# Patient Record
Sex: Female | Born: 1935 | Race: White | Hispanic: No | Smoking: Never smoker
Health system: Southern US, Community
[De-identification: ages and names within clinical notes are randomized; demographics above are authoritative.]

## PROBLEM LIST (undated history)

## (undated) DIAGNOSIS — I509 Heart failure, unspecified: Secondary | ICD-10-CM

## (undated) DIAGNOSIS — E785 Hyperlipidemia, unspecified: Secondary | ICD-10-CM

## (undated) DIAGNOSIS — E039 Hypothyroidism, unspecified: Secondary | ICD-10-CM

## (undated) DIAGNOSIS — F419 Anxiety disorder, unspecified: Secondary | ICD-10-CM

## (undated) DIAGNOSIS — F0390 Unspecified dementia without behavioral disturbance: Secondary | ICD-10-CM

## (undated) DIAGNOSIS — K219 Gastro-esophageal reflux disease without esophagitis: Secondary | ICD-10-CM

## (undated) DIAGNOSIS — N289 Disorder of kidney and ureter, unspecified: Secondary | ICD-10-CM

## (undated) DIAGNOSIS — M810 Age-related osteoporosis without current pathological fracture: Secondary | ICD-10-CM

## (undated) DIAGNOSIS — I10 Essential (primary) hypertension: Secondary | ICD-10-CM

## (undated) HISTORY — PX: BLADDER SURGERY: SHX569

---

## 2014-04-27 ENCOUNTER — Encounter (HOSPITAL_COMMUNITY): Payer: Self-pay | Admitting: *Deleted

## 2014-04-27 ENCOUNTER — Emergency Department (HOSPITAL_COMMUNITY): Payer: Medicare HMO

## 2014-04-27 ENCOUNTER — Emergency Department (HOSPITAL_COMMUNITY)
Admission: EM | Admit: 2014-04-27 | Discharge: 2014-04-27 | Disposition: A | Discharge status: Home / Self Care | Payer: Medicare HMO | Attending: Emergency Medicine | Admitting: Emergency Medicine

## 2014-04-27 ENCOUNTER — Emergency Department (HOSPITAL_COMMUNITY)
Admission: EM | Admit: 2014-04-27 | Discharge: 2014-04-28 | Disposition: A | Discharge status: Home / Self Care | Payer: Medicare HMO | Attending: Emergency Medicine | Admitting: Emergency Medicine

## 2014-04-27 ENCOUNTER — Encounter (HOSPITAL_COMMUNITY): Payer: Self-pay | Admitting: Emergency Medicine

## 2014-04-27 DIAGNOSIS — I10 Essential (primary) hypertension: Secondary | ICD-10-CM | POA: Insufficient documentation

## 2014-04-27 DIAGNOSIS — Z880 Allergy status to penicillin: Secondary | ICD-10-CM | POA: Insufficient documentation

## 2014-04-27 DIAGNOSIS — Z7982 Long term (current) use of aspirin: Secondary | ICD-10-CM | POA: Insufficient documentation

## 2014-04-27 DIAGNOSIS — E039 Hypothyroidism, unspecified: Secondary | ICD-10-CM | POA: Insufficient documentation

## 2014-04-27 DIAGNOSIS — Z8639 Personal history of other endocrine, nutritional and metabolic disease: Secondary | ICD-10-CM | POA: Insufficient documentation

## 2014-04-27 DIAGNOSIS — R197 Diarrhea, unspecified: Secondary | ICD-10-CM | POA: Diagnosis not present

## 2014-04-27 DIAGNOSIS — Z8739 Personal history of other diseases of the musculoskeletal system and connective tissue: Secondary | ICD-10-CM | POA: Diagnosis not present

## 2014-04-27 DIAGNOSIS — R1012 Left upper quadrant pain: Secondary | ICD-10-CM | POA: Insufficient documentation

## 2014-04-27 DIAGNOSIS — Z79899 Other long term (current) drug therapy: Secondary | ICD-10-CM | POA: Insufficient documentation

## 2014-04-27 DIAGNOSIS — R110 Nausea: Secondary | ICD-10-CM | POA: Diagnosis not present

## 2014-04-27 DIAGNOSIS — K551 Chronic vascular disorders of intestine: Secondary | ICD-10-CM | POA: Insufficient documentation

## 2014-04-27 HISTORY — DX: Hypothyroidism, unspecified: E03.9

## 2014-04-27 HISTORY — DX: Hyperlipidemia, unspecified: E78.5

## 2014-04-27 HISTORY — DX: Essential (primary) hypertension: I10

## 2014-04-27 HISTORY — DX: Age-related osteoporosis without current pathological fracture: M81.0

## 2014-04-27 LAB — COMPREHENSIVE METABOLIC PANEL
ALK PHOS: 55 U/L (ref 39–117)
ALT: 15 U/L (ref 0–35)
ALT: 13 U/L (ref 0–35)
AST: 36 U/L (ref 0–37)
AST: 35 U/L (ref 0–37)
Albumin: 4.1 g/dL (ref 3.5–5.2)
Albumin: 4.3 g/dL (ref 3.5–5.2)
Alkaline Phosphatase: 54 U/L (ref 39–117)
Anion gap: 10 (ref 5–15)
Anion gap: 8 (ref 5–15)
BILIRUBIN TOTAL: 0.8 mg/dL (ref 0.3–1.2)
BUN: 14 mg/dL (ref 6–23)
BUN: 10 mg/dL (ref 6–23)
CALCIUM: 8.5 mg/dL (ref 8.4–10.5)
CHLORIDE: 96 mmol/L (ref 96–112)
CO2: 24 mmol/L (ref 19–32)
CO2: 27 mmol/L (ref 19–32)
Calcium: 8.4 mg/dL (ref 8.4–10.5)
Chloride: 98 mmol/L (ref 96–112)
Creatinine, Ser: 0.53 mg/dL (ref 0.50–1.10)
Creatinine, Ser: 0.52 mg/dL (ref 0.50–1.10)
GFR calc Af Amer: >90 mL/min (ref >90)
GFR calc non Af Amer: 80 mL/min — ABNORMAL LOW (ref >90)
GFR calc non Af Amer: 80 mL/min — ABNORMAL LOW (ref >90)
Glucose, Bld: 141 mg/dL — ABNORMAL HIGH (ref 70–99)
Glucose, Bld: 144 mg/dL — ABNORMAL HIGH (ref 70–99)
POTASSIUM: 3.6 mmol/L (ref 3.5–5.1)
Potassium: 3.5 mmol/L (ref 3.5–5.1)
Sodium: 132 mmol/L — ABNORMAL LOW (ref 135–145)
Sodium: 131 mmol/L — ABNORMAL LOW (ref 135–145)
TOTAL PROTEIN: 6.6 g/dL (ref 6.0–8.3)
Total Bilirubin: 0.8 mg/dL (ref 0.3–1.2)
Total Protein: 6.8 g/dL (ref 6.0–8.3)

## 2014-04-27 LAB — CBC WITH DIFFERENTIAL/PLATELET
BASOS ABS: 0 10*3/uL (ref 0.0–0.1)
BASOS ABS: 0 10*3/uL (ref 0.0–0.1)
BASOS PCT: 0 % (ref 0–1)
Basophils Relative: 0 % (ref 0–1)
EOS ABS: 0 10*3/uL (ref 0.0–0.7)
Eosinophils Absolute: 0 10*3/uL (ref 0.0–0.7)
Eosinophils Relative: 0 % (ref 0–5)
Eosinophils Relative: 0 % (ref 0–5)
HCT: 36.3 % (ref 36.0–46.0)
HEMATOCRIT: 35.2 % — AB (ref 36.0–46.0)
Hemoglobin: 12 g/dL (ref 12.0–15.0)
Hemoglobin: 12.2 g/dL (ref 12.0–15.0)
LYMPHS ABS: 0.6 10*3/uL — AB (ref 0.7–4.0)
LYMPHS PCT: 9 % — AB (ref 12–46)
LYMPHS PCT: 12 % (ref 12–46)
Lymphs Abs: 0.5 10*3/uL — ABNORMAL LOW (ref 0.7–4.0)
MCH: 31.6 pg (ref 26.0–34.0)
MCH: 31.4 pg (ref 26.0–34.0)
MCHC: 34.1 g/dL (ref 30.0–36.0)
MCHC: 33.6 g/dL (ref 30.0–36.0)
MCV: 92.6 fL (ref 78.0–100.0)
MCV: 93.6 fL (ref 78.0–100.0)
MONO ABS: 0.2 10*3/uL (ref 0.1–1.0)
Monocytes Absolute: 0.4 10*3/uL (ref 0.1–1.0)
Monocytes Relative: 4 % (ref 3–12)
Monocytes Relative: 9 % (ref 3–12)
NEUTROS ABS: 3.7 10*3/uL (ref 1.7–7.7)
NEUTROS PCT: 79 % — AB (ref 43–77)
Neutro Abs: 4.4 10*3/uL (ref 1.7–7.7)
Neutrophils Relative %: 87 % — ABNORMAL HIGH (ref 43–77)
PLATELETS: 182 10*3/uL (ref 150–400)
PLATELETS: 206 10*3/uL (ref 150–400)
RBC: 3.8 MIL/uL — ABNORMAL LOW (ref 3.87–5.11)
RBC: 3.88 MIL/uL (ref 3.87–5.11)
RDW: 13.2 % (ref 11.5–15.5)
RDW: 13.2 % (ref 11.5–15.5)
WBC: 5.1 10*3/uL (ref 4.0–10.5)
WBC: 4.6 10*3/uL (ref 4.0–10.5)

## 2014-04-27 LAB — URINALYSIS, ROUTINE W REFLEX MICROSCOPIC
BILIRUBIN URINE: NEGATIVE
Bilirubin Urine: NEGATIVE
Glucose, UA: NEGATIVE mg/dL
Glucose, UA: NEGATIVE mg/dL
HGB URINE DIPSTICK: NEGATIVE
Hgb urine dipstick: NEGATIVE
Ketones, ur: NEGATIVE mg/dL
Ketones, ur: 40 mg/dL — AB
LEUKOCYTES UA: NEGATIVE
Leukocytes, UA: NEGATIVE
Nitrite: NEGATIVE
Nitrite: NEGATIVE
PH: 7.5 (ref 5.0–8.0)
Protein, ur: NEGATIVE mg/dL
Protein, ur: NEGATIVE mg/dL
SPECIFIC GRAVITY, URINE: 1.007 (ref 1.005–1.030)
Specific Gravity, Urine: 1.011 (ref 1.005–1.030)
UROBILINOGEN UA: 0.2 mg/dL (ref 0.0–1.0)
Urobilinogen, UA: 0.2 mg/dL (ref 0.0–1.0)
pH: 7 (ref 5.0–8.0)

## 2014-04-27 LAB — I-STAT CG4 LACTIC ACID, ED: Lactic Acid, Venous: 1.13 mmol/L (ref 0.5–2.0)

## 2014-04-27 LAB — LIPASE, BLOOD: LIPASE: 26 U/L (ref 11–59)

## 2014-04-27 MED ORDER — ONDANSETRON HCL 4 MG/2ML IJ SOLN
4.0000 mg | Freq: Once | INTRAMUSCULAR | Status: AC
  Administered 2014-04-27: 4 mg via INTRAVENOUS
  Filled 2014-04-27: qty 2

## 2014-04-27 MED ORDER — SODIUM CHLORIDE 0.9 % IV BOLUS (SEPSIS)
500.0000 mL | Freq: Once | INTRAVENOUS | Status: AC
  Administered 2014-04-27: 500 mL via INTRAVENOUS

## 2014-04-27 MED ORDER — SODIUM CHLORIDE 0.9 % IV SOLN
INTRAVENOUS | Status: DC
  Administered 2014-04-27: 08:00:00 via INTRAVENOUS

## 2014-04-27 MED ORDER — SODIUM CHLORIDE 0.9 % IV BOLUS (SEPSIS)
1000.0000 mL | Freq: Once | INTRAVENOUS | Status: AC
  Administered 2014-04-27: 1000 mL via INTRAVENOUS

## 2014-04-27 MED ORDER — LOPERAMIDE HCL 2 MG PO CAPS
2.0000 mg | ORAL_CAPSULE | Freq: Once | ORAL | Status: AC
  Administered 2014-04-27: 2 mg via ORAL
  Filled 2014-04-27: qty 1

## 2014-04-27 MED ORDER — ONDANSETRON 4 MG PO TBDP: 4.0000 mg | ORAL_TABLET | Freq: Three times a day (TID) | ORAL | Status: DC | PRN

## 2014-04-27 MED ORDER — LOPERAMIDE HCL 2 MG PO CAPS: 2.0000 mg | ORAL_CAPSULE | Freq: Three times a day (TID) | ORAL | Status: DC | PRN

## 2014-04-27 MED ORDER — SODIUM CHLORIDE 0.9 % IV SOLN
1000.0000 mL | Freq: Once | INTRAVENOUS | Status: AC
  Administered 2014-04-27: 1000 mL via INTRAVENOUS

## 2014-04-27 NOTE — ED Notes (Signed)
Pt's daughter reports nausea is not getting any better.  Was seen here earlier for same and was instructed to return if worse.  Daughter reports pt is increasingly weak with severe nausea.  Pt was able to eat a toast and drank some water and OJ.

## 2014-04-27 NOTE — Discharge Instructions (Signed)
Nausea, Adult Nausea is the feeling that you have an upset stomach or have to vomit. Nausea by itself is not likely a serious concern, but it may be an early sign of more serious medical problems. As nausea gets worse, it can lead to vomiting. If vomiting develops, there is the risk of dehydration.  CAUSES   Viral infections.  Food poisoning.  Medicines.  Pregnancy.  Motion sickness.  Migraine headaches.  Emotional distress.  Severe pain from any source.  Alcohol intoxication. HOME CARE INSTRUCTIONS  Get plenty of rest.  Ask your caregiver about specific rehydration instructions.  Eat small amounts of food and sip liquids more often.  Take all medicines as told by your caregiver. SEEK MEDICAL CARE IF:  You have not improved after 2 days, or you get worse.  You have a headache. SEEK IMMEDIATE MEDICAL CARE IF:   You have a fever.  You faint.  You keep vomiting or have blood in your vomit.  You are extremely weak or dehydrated.  You have dark or bloody stools.  You have severe chest or abdominal pain. MAKE SURE YOU:  Understand these instructions.  Will watch your condition.  Will get help right away if you are not doing well or get worse. Document Released: 03/23/2004 Document Revised: 11/08/2011 Document Reviewed: 10/26/2010 [facility] Patient Information 2015 [place]. This information is not intended to replace advice given to you by your health care provider. Make sure you discuss any questions you have with your health care provider.   Diarrhea Diarrhea is frequent loose and watery bowel movements. It can cause you to feel weak and dehydrated. Dehydration can cause you to become tired and thirsty, have a dry mouth, and have decreased urination that often is dark yellow. Diarrhea is a sign of another problem, most often an infection that will not last long. In most cases, diarrhea typically lasts 2-3 days. However, it can last longer if it is a sign  of something more serious. It is important to treat your diarrhea as directed by your caregiver to lessen or prevent future episodes of diarrhea. CAUSES  Some common causes include:  Gastrointestinal infections caused by viruses, bacteria, or parasites.  Food poisoning or food allergies.  Certain medicines, such as antibiotics, chemotherapy, and laxatives.  Artificial sweeteners and fructose.  Digestive disorders. HOME CARE INSTRUCTIONS  Ensure adequate fluid intake (hydration): Have 1 cup (8 oz) of fluid for each diarrhea episode. Avoid fluids that contain simple sugars or sports drinks, fruit juices, whole milk products, and sodas. Your urine should be clear or pale yellow if you are drinking enough fluids. Hydrate with an oral rehydration solution that you can purchase at pharmacies, retail stores, and online. You can prepare an oral rehydration solution at home by mixing the following ingredients together:   - tsp table salt.   tsp baking soda.   tsp salt substitute containing potassium chloride.  1  tablespoons sugar.  1 L (34 oz) of water.  Certain foods and beverages may increase the speed at which food moves through the gastrointestinal (GI) tract. These foods and beverages should be avoided and include:  Caffeinated and alcoholic beverages.  High-fiber foods, such as raw fruits and vegetables, nuts, seeds, and whole grain breads and cereals.  Foods and beverages sweetened with sugar alcohols, such as xylitol, sorbitol, and mannitol.  Some foods may be well tolerated and may help thicken stool including:  Starchy foods, such as rice, toast, pasta, low-sugar cereal, oatmeal, grits, baked  potatoes, crackers, and bagels.  Bananas.  Applesauce.  Add probiotic-rich foods to help increase healthy bacteria in the GI tract, such as yogurt and fermented milk products.  Wash your hands well after each diarrhea episode.  Only take over-the-counter or prescription  medicines as directed by your caregiver.  Take a warm bath to relieve any burning or pain from frequent diarrhea episodes. SEEK IMMEDIATE MEDICAL CARE IF:   You are unable to keep fluids down.  You have persistent vomiting.  You have blood in your stool, or your stools are black and tarry.  You do not urinate in 6-8 hours, or there is only a small amount of very dark urine.  You have abdominal pain that increases or localizes.  You have weakness, dizziness, confusion, or light-headedness.  You have a severe headache.  Your diarrhea gets worse or does not get better.  You have a fever or persistent symptoms for more than 2-3 days.  You have a fever and your symptoms suddenly get worse. MAKE SURE YOU:   Understand these instructions.  Will watch your condition.  Will get help right away if you are not doing well or get worse. Document Released: 02/03/2002 Document Revised: 06/30/2013 Document Reviewed: 10/22/2011 [facility] Patient Information 2015 [place], [place]. This information is not intended to replace advice given to you by your health care provider. Make sure you discuss any questions you have with your health care provider.    Food Choices to Help Relieve Diarrhea When you have diarrhea, the foods you eat and your eating habits are very important. Choosing the right foods and drinks can help relieve diarrhea. Also, because diarrhea can last up to 7 days, you need to replace lost fluids and electrolytes (such as sodium, potassium, and chloride) in order to help prevent dehydration.  WHAT GENERAL GUIDELINES DO I NEED TO FOLLOW?  Slowly drink 1 cup (8 oz) of fluid for each episode of diarrhea. If you are getting enough fluid, your urine will be clear or pale yellow.  Eat starchy foods. Some good choices include white rice, white toast, pasta, low-fiber cereal, baked potatoes (without the skin), saltine crackers, and bagels.  Avoid large servings of any cooked  vegetables.  Limit fruit to two servings per day. A serving is  cup or 1 small piece.  Choose foods with less than 2 g of fiber per serving.  Limit fats to less than 8 tsp (38 g) per day.  Avoid fried foods.  Eat foods that have probiotics in them. Probiotics can be found in certain dairy products.  Avoid foods and beverages that may increase the speed at which food moves through the stomach and intestines (gastrointestinal tract). Things to avoid include:  High-fiber foods, such as dried fruit, raw fruits and vegetables, nuts, seeds, and whole grain foods.  Spicy foods and high-fat foods.  Foods and beverages sweetened with high-fructose corn syrup, honey, or sugar alcohols such as xylitol, sorbitol, and mannitol. WHAT FOODS ARE RECOMMENDED? Grains White rice. White, [place], or pita breads (fresh or toasted), including plain rolls, buns, or bagels. White pasta. Saltine, soda, or graham crackers. Pretzels. Low-fiber cereal. Cooked cereals made with water (such as cornmeal, farina, or cream cereals). Plain muffins. Matzo. Melba toast. Zwieback.  Vegetables Potatoes (without the skin). Strained tomato and vegetable juices. Most well-cooked and canned vegetables without seeds. Tender lettuce. Fruits Cooked or canned applesauce, apricots, cherries, fruit cocktail, grapefruit, peaches, pears, or plums. Fresh bananas, apples without skin, cherries, grapes, cantaloupe, grapefruit, peaches,  oranges, or plums.  Meat and Other Protein Products Baked or boiled chicken. Eggs. Tofu. Fish. Seafood. Smooth peanut butter. Ground or well-cooked tender beef, ham, veal, lamb, pork, or poultry.  Dairy Plain yogurt, kefir, and unsweetened liquid yogurt. Lactose-free milk, buttermilk, or soy milk. Plain hard cheese. Beverages Sport drinks. Clear broths. Diluted fruit juices (except prune). Regular, caffeine-free sodas such as ginger ale. Water. Decaffeinated teas. Oral rehydration solutions. Sugar-free  beverages not sweetened with sugar alcohols. Other Bouillon, broth, or soups made from recommended foods.  The items listed above may not be a complete list of recommended foods or beverages. Contact your dietitian for more options. WHAT FOODS ARE NOT RECOMMENDED? Grains Whole grain, whole wheat, bran, or rye breads, rolls, pastas, crackers, and cereals. Wild or brown rice. Cereals that contain more than 2 g of fiber per serving. Corn tortillas or taco shells. Cooked or dry oatmeal. Granola. Popcorn. Vegetables Raw vegetables. Cabbage, broccoli, Brussels sprouts, artichokes, baked beans, beet greens, corn, kale, legumes, peas, sweet potatoes, and yams. Potato skins. Cooked spinach and cabbage. Fruits Dried fruit, including raisins and dates. Raw fruits. Stewed or dried prunes. Fresh apples with skin, apricots, mangoes, pears, raspberries, and strawberries.  Meat and Other Protein Products Chunky peanut butter. Nuts and seeds. Beans and lentils. Tomasa Blase.  Dairy High-fat cheeses. Milk, chocolate milk, and beverages made with milk, such as milk shakes. Cream. Ice cream. Sweets and Desserts Sweet rolls, doughnuts, and sweet breads. Pancakes and waffles. Fats and Oils Butter. Cream sauces. Margarine. Salad oils. Plain salad dressings. Olives. Avocados.  Beverages Caffeinated beverages (such as coffee, tea, soda, or energy drinks). Alcoholic beverages. Fruit juices with pulp. Prune juice. Soft drinks sweetened with high-fructose corn syrup or sugar alcohols. Other Coconut. Hot sauce. Chili powder. Mayonnaise. Gravy. Cream-based or milk-based soups.  The items listed above may not be a complete list of foods and beverages to avoid. Contact your dietitian for more information. WHAT SHOULD I DO IF I BECOME DEHYDRATED? Diarrhea can sometimes lead to dehydration. Signs of dehydration include dark urine and dry mouth and skin. If you think you are dehydrated, you should rehydrate with an oral rehydration  solution. These solutions can be purchased at pharmacies, retail stores, or online.  Drink -1 cup (120-240 mL) of oral rehydration solution each time you have an episode of diarrhea. If drinking this amount makes your diarrhea worse, try drinking smaller amounts more often. For example, drink 1-3 tsp (5-15 mL) every 5-10 minutes.  A general rule for staying hydrated is to drink 1-2 L of fluid per day. Talk to your health care provider about the specific amount you should be drinking each day. Drink enough fluids to keep your urine clear or pale yellow. Document Released: 05/06/2003 Document Revised: 02/18/2013 Document Reviewed: 01/06/2013 [facility] Patient Information 2015 [place], [place]. This information is not intended to replace advice given to you by your health care provider. Make sure you discuss any questions you have with your health care provider.

## 2014-04-27 NOTE — ED Provider Notes (Signed)
CSN: [id number]     Arrival date & time 04/27/14  2025 History   First MD Initiated Contact with Patient 04/27/14 2255     Chief Complaint  Patient presents with  . Nausea     (Consider location/radiation/quality/duration/timing/severity/associated sxs/prior Treatment) HPI Pamela Peterson is a 81 y.o. female with past medical history of hypothyroidism, hypertension, hyperlipidemia presenting today with nausea. This is a chronic complaint, daughter states that whenever she smells strong odor she has intense nausea that does not allow her to eat. She was seen in this emergency department earlier today, given IV fluids, Zofran and felt better. She was told to come back if her symptoms did not completely resolve at home. She did have a small meal today but otherwise was extremely nauseous. She reportedly had a CT scan 2 weeks ago at an outside emergency department which was negative. Patient was diaphoretic as well. She denies subjective fevers or recent infections. She also admits to left lower quadrant abdominal pain. Patient has no further complaints.  10 Systems reviewed and are negative for acute change except as noted in the HPI.    Past Medical History  Diagnosis Date  . Osteoporosis   . Hypothyroid   . Hypertension   . Hyperlipidemia    History reviewed. No pertinent past surgical history. No family history on file. History  Substance Use Topics  . Smoking status: Never Smoker   . Smokeless tobacco: Not on file  . Alcohol Use: No   OB History    No data available     Review of Systems    Allergies  Penicillins  Home Medications   Prior to Admission medications   Medication Sig Start Date End Date Taking? Authorizing Provider  amLODipine (NORVASC) 5 MG tablet Take 10 mg by mouth at bedtime.   Yes Historical Provider, MD  aspirin EC 81 MG tablet Take 81 mg by mouth daily.   Yes Historical Provider, MD  CALCIUM-VITAMIN D PO Take 1 tablet by mouth 2 (two) times daily.  Calcium 600- D3- 2000   Yes Historical Provider, MD  Cholecalciferol (VITAMIN D3) 2000 UNITS TABS Take 1 tablet by mouth daily.   Yes Historical Provider, MD  esomeprazole (NEXIUM) 40 MG capsule Take 40 mg by mouth daily at 12 noon.   Yes Historical Provider, MD  levothyroxine (SYNTHROID, LEVOTHROID) 50 MCG tablet Take 50 mcg by mouth daily before breakfast.   Yes Historical Provider, MD  losartan (COZAAR) 100 MG tablet Take 100 mg by mouth daily.   Yes Historical Provider, MD  mirabegron ER (MYRBETRIQ) 25 MG TB24 tablet Take 1 mg by mouth daily.   Yes Historical Provider, MD  pregabalin (LYRICA) 100 MG capsule Take 200 mg by mouth 2 (two) times daily.   Yes Historical Provider, MD  Chlorpheniramine-Acetaminophen (CORICIDIN HBP COLD/FLU PO) Take 1-2 tablets by mouth daily as needed (sinuese/congestion). For congestion    Historical Provider, MD  loperamide (IMODIUM) 2 MG capsule Take 1 capsule (2 mg total) by mouth every 8 (eight) hours as needed for diarrhea or loose stools. 04/27/14   Kristen N Ward, DO  ondansetron (ZOFRAN ODT) 4 MG disintegrating tablet Take 1 tablet (4 mg total) by mouth every 8 (eight) hours as needed for nausea or vomiting. 04/27/14   Kristen N Ward, DO   BP 155/81 mmHg  Pulse 80  Temp(Src) 97.9 F (36.6 C) (Oral)  Resp 17  SpO2 99% Physical Exam  Constitutional: She is oriented to person, place, and time. She  appears well-developed and well-nourished. No distress.  HENT:  Head: Normocephalic and atraumatic.  Nose: Nose normal.  Mouth/Throat: Oropharynx is clear and moist. No oropharyngeal exudate.  Eyes: Conjunctivae and EOM are normal. Pupils are equal, round, and reactive to light. No scleral icterus.  Neck: Normal range of motion. Neck supple. No JVD present. No tracheal deviation present. No thyromegaly present.  Cardiovascular: Normal rate, regular rhythm and normal heart sounds.  Exam reveals no gallop and no friction rub.   No murmur heard. Pulmonary/Chest:  Effort normal and breath sounds normal. No respiratory distress. She has no wheezes. She exhibits no tenderness.  Abdominal: Soft. Bowel sounds are normal. She exhibits no distension and no mass. There is no tenderness. There is no rebound and no guarding.  Musculoskeletal: Normal range of motion. She exhibits no edema or tenderness.  Lymphadenopathy:    She has no cervical adenopathy.  Neurological: She is alert and oriented to person, place, and time. No cranial nerve deficit. She exhibits normal muscle tone.  Skin: Skin is warm and dry. No rash noted. She is not diaphoretic. No erythema. No pallor.  Nursing note and vitals reviewed.   ED Course  Procedures (including critical care time) Labs Review Labs Reviewed  CBC WITH DIFFERENTIAL/PLATELET - Abnormal; Notable for the following:    Neutrophils Relative % 79 (*)    Lymphs Abs 0.6 (*)    All other components within normal limits  COMPREHENSIVE METABOLIC PANEL - Abnormal; Notable for the following:    Sodium 131 (*)    Glucose, Bld 144 (*)    GFR calc non Af Amer 80 (*)    All other components within normal limits  URINALYSIS, ROUTINE W REFLEX MICROSCOPIC - Abnormal; Notable for the following:    Ketones, ur 40 (*)    All other components within normal limits  LIPASE, BLOOD  I-STAT TROPOININ, ED    Imaging Review Ct Abdomen Pelvis W Contrast  04/28/2014   CLINICAL DATA:  Diarrhea and nausea.  EXAM: CT ABDOMEN AND PELVIS WITH CONTRAST  TECHNIQUE: Multidetector CT imaging of the abdomen and pelvis was performed using the standard protocol following bolus administration of intravenous contrast.  CONTRAST:  70mL OMNIPAQUE IOHEXOL 300 MG/ML  SOLN  COMPARISON:  None.  FINDINGS: BODY WALL: Unremarkable.  LOWER CHEST: Scarring or atelectasis in the lower lungs with minimal nodularity in the right middle lobe.  ABDOMEN/PELVIS:  Liver: No focal abnormality.  Biliary: No evidence of biliary obstruction or stone.  Pancreas: Unremarkable.   Spleen: Unremarkable.  Adrenals: Unremarkable.  Kidneys and ureters: No hydronephrosis or stone.  Bladder: Changes of bladder suspension.  No pathologic findings.  Reproductive: Hysterectomy.  The ovaries are unremarkable.  Bowel: No obstruction. No bowel wall thickening.  Appendectomy.  Retroperitoneum: No mass or adenopathy.  Peritoneum: No ascites or pneumoperitoneum.  Vascular: No acute abnormality. Small duplicated left IVC. Diffuse atherosclerotic calcification with prominent stenosis of the proximal SMA and celiac axis.  OSSEOUS: No acute abnormalities. Advanced multilevel degenerative disc disease, particularly severe at L1-2 with retrolisthesis and disc narrowing causes advanced foraminal stenosis.  IMPRESSION: 1. No acute intra-abdominal findings. 2. 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   Electronically Signed   By: Marnee SpringJonathon  Watts M.D.   On: 04/28/2014 02:24     EKG Interpretation   Date/Time:  Monday April 27 2014 23:35:57 EST Ventricular Rate:  79 PR Interval:  167 QRS Duration: 127 QT Interval:  458 QTC Calculation: 525 R Axis:   -  14 Text Interpretation:  Sinus rhythm Multiform ventricular premature  complexes Probable left atrial enlargement Left bundle branch block  Baseline wander in lead(s) V2 Confirmed by Erroll Luna [id number])  on 04/27/2014 11:43:34 PM      MDM   Final diagnoses:  Nausea    Patient since emergency department for isolated nausea. She is a bounce back from this morning, in addition to being a 81 year old female will obtain further diagnostics. Laboratory studies were obtained, she was given IV fluids and Zofran. Will perform CT scan of abdomen.  CT scan of the abdomen only reveals atherosclerotic narrowing of the celiac axis and SMA. Upon further questioning patient does not have any worsening symptoms with meals, suggesting mesenteric ischemia. Patient continues to be nauseous despite Zofran. Currently there  is not an indication to admit the patient, there is no dehydration on laboratory studies. She was given a dose of Reglan in the emergency department. She has a prescription from earlier today which she will get filled. Primary care follow-up was advised within 3 days. Dehydration return precautions given. Her vital signs remain within her normal limits and she is safe for discharge.    Tomasita Crumble, MD 04/28/14 [phone]

## 2014-04-27 NOTE — ED Notes (Signed)
Pt. Was given sprite and tolerated it well.

## 2014-04-27 NOTE — ED Provider Notes (Signed)
TIME SEEN: 8:15 AM  CHIEF COMPLAINT: Nausea, diarrhea  HPI: Pt is a 81 y.o. female with history of hypertension, hyperlipidemia, hypothyroidism who presents emergency department with nausea and diarrhea. Diarrhea started yesterday. She had 4 episodes of nonbloody diarrhea yesterday and has had 3 episodes today. She also has nausea but no vomiting. No fevers or chills. States she will have an occasional sharp pain in the left upper quadrant that lasts for only several seconds and then completely resolves. No other abdominal pain. No recent sick contacts, travel, hospitalization or antibiotic use. States she had similar symptoms several weeks ago and was seen at [facility] and diagnosed with a viral illness. At that time she reportedly had a negative CT scan. She is status post appendectomy.  ROS: See HPI Constitutional: no fever  Eyes: no drainage  ENT: no runny nose   Cardiovascular:  no chest pain  Resp: no SOB  GI: no vomiting GU: no dysuria Integumentary: no rash  Allergy: no hives  Musculoskeletal: no leg swelling  Neurological: no slurred speech ROS otherwise negative  PAST MEDICAL HISTORY/PAST SURGICAL HISTORY:  Past Medical History  Diagnosis Date  . Osteoporosis   . Hypothyroid   . Hypertension   . Hyperlipidemia     MEDICATIONS:  Prior to Admission medications   Not on File    ALLERGIES:  Allergies  Allergen Reactions  . Penicillins     SOCIAL HISTORY:  History  Substance Use Topics  . Smoking status: Never Smoker   . Smokeless tobacco: Not on file  . Alcohol Use: No    FAMILY HISTORY: History reviewed. No pertinent family history.  EXAM: BP 155/69 mmHg  Pulse 85  Temp(Src) 98.3 F (36.8 C) (Oral)  Resp 12  SpO2 94% CONSTITUTIONAL: Alert and oriented and responds appropriately to questions. Well-appearing; well-nourished HEAD: Normocephalic EYES: Conjunctivae clear, PERRL ENT: normal nose; no rhinorrhea; slightly dry mucous  membranes; pharynx without lesions noted NECK: Supple, no meningismus, no LAD  CARD: RRR; S1 and S2 appreciated; no murmurs, no clicks, no rubs, no gallops RESP: Normal chest excursion without splinting or tachypnea; breath sounds clear and equal bilaterally; no wheezes, no rhonchi, no rales ABD/GI: Normal bowel sounds; non-distended; soft, non-tender, no rebound, no guarding, negative Murphy sign, no peritoneal signs BACK:  The back appears normal and is non-tender to palpation, there is no CVA tenderness EXT: Normal ROM in all joints; non-tender to palpation; no edema; normal capillary refill; no cyanosis    SKIN: Normal color for age and race; warm NEURO: Moves all extremities equally PSYCH: The patient's mood and manner are appropriate. Grooming and personal hygiene are appropriate.  MEDICAL DECISION MAKING: Patient here with nausea, diarrhea. Possible viral illness. Abdominal exam is benign. She is hemodynamically stable, afebrile, nontoxic. She does appear slightly dry on exam. Will give IV fluids, Zofran, Imodium. We'll obtain labs including LFTs, lipase, lactate. We'll also obtain urinalysis. I do not feel she needs CT imaging at this time. Patient and daughter at bedside agree with plan.  ED PROGRESS: Patient's labs are unremarkable. Her lactate is normal at 1.13. Urine shows no sign of infection. She appears well hydrated now after IV fluids and is striking without difficulty. Suspect viral illness. Discussed using Imodium as needed over-the-counter cautiously for diarrhea and will discharge with prescription for Zofran. Discussed strict return precautions. Her abdominal exam is still benign. Patient and her daughter at bedside agree with this plan. Patient does have a primary care physician for follow-up.  Layla MawKristen N Braydin Aloi, DO 04/27/14 [phone]

## 2014-04-27 NOTE — ED Notes (Signed)
Daughter driving pt home. Pt made aware to follow up with PCP.

## 2014-04-27 NOTE — ED Notes (Signed)
MD at bedside. 

## 2014-04-28 ENCOUNTER — Emergency Department (HOSPITAL_COMMUNITY): Payer: Medicare HMO

## 2014-04-28 ENCOUNTER — Encounter (HOSPITAL_COMMUNITY): Payer: Self-pay

## 2014-04-28 LAB — I-STAT TROPONIN, ED: Troponin i, poc: 0.01 ng/mL (ref 0.00–0.08)

## 2014-04-28 MED ORDER — IOHEXOL 300 MG/ML  SOLN
70.0000 mL | Freq: Once | INTRAMUSCULAR | Status: AC | PRN
  Administered 2014-04-28: 70 mL via INTRAVENOUS

## 2014-04-28 MED ORDER — METOCLOPRAMIDE HCL 10 MG PO TABS
5.0000 mg | ORAL_TABLET | Freq: Once | ORAL | Status: AC
  Administered 2014-04-28: 5 mg via ORAL
  Filled 2014-04-28: qty 1

## 2014-04-28 MED ORDER — METOCLOPRAMIDE HCL 5 MG/ML IJ SOLN: 5.0000 mg | Freq: Once | INTRAMUSCULAR | Status: DC

## 2014-04-28 NOTE — Discharge Instructions (Signed)
Nausea, Adult Pamela Peterson, your CT scan did not show any cause for your nausea. Continue to take Zofran at home as needed and stay well hydrated. Follow-up with her primary care physician within 3 days. If symptoms worsen come back to emergency department and immediately. Thank you. Nausea means you feel sick to your stomach or need to throw up (vomit). It may be a sign of a more serious problem. If nausea gets worse, you may throw up. If you throw up a lot, you may lose too much body fluid (dehydration). HOME CARE   Get plenty of rest.  Ask your doctor how to replace body fluid losses (rehydrate).  Eat small amounts of food. Sip liquids more often.  Take all medicines as told by your doctor. GET HELP RIGHT AWAY IF:  You have a fever.  You pass out (faint).  You keep throwing up or have blood in your throw up.  You are very weak, have dry lips or a dry mouth, or you are very thirsty (dehydrated).  You have dark or bloody poop (stool).  You have very bad chest or belly (abdominal) pain.  You do not get better after 2 days, or you get worse.  You have a headache. MAKE SURE YOU:  Understand these instructions.  Will watch your condition.  Will get help right away if you are not doing well or get worse. Document Released: 02/02/2011 Document Revised: 05/08/2011 Document Reviewed: 02/02/2011 [employer] Patient Information 2015 [employer] [place]. This information is not intended to replace advice given to you by your health care provider. Make sure you discuss any questions you have with your health care provider.

## 2014-04-28 NOTE — ED Notes (Signed)
Pt ambulatory to bathroom

## 2014-07-21 ENCOUNTER — Emergency Department (HOSPITAL_COMMUNITY)
Admission: EM | Admit: 2014-07-21 | Discharge: 2014-07-21 | Disposition: A | Discharge status: Home / Self Care | Payer: Medicare HMO | Attending: Emergency Medicine | Admitting: Emergency Medicine

## 2014-07-21 ENCOUNTER — Encounter (HOSPITAL_COMMUNITY): Payer: Self-pay

## 2014-07-21 DIAGNOSIS — E785 Hyperlipidemia, unspecified: Secondary | ICD-10-CM | POA: Diagnosis not present

## 2014-07-21 DIAGNOSIS — Z880 Allergy status to penicillin: Secondary | ICD-10-CM | POA: Insufficient documentation

## 2014-07-21 DIAGNOSIS — E039 Hypothyroidism, unspecified: Secondary | ICD-10-CM | POA: Diagnosis not present

## 2014-07-21 DIAGNOSIS — Z79899 Other long term (current) drug therapy: Secondary | ICD-10-CM | POA: Diagnosis not present

## 2014-07-21 DIAGNOSIS — Z7982 Long term (current) use of aspirin: Secondary | ICD-10-CM | POA: Insufficient documentation

## 2014-07-21 DIAGNOSIS — M5430 Sciatica, unspecified side: Secondary | ICD-10-CM | POA: Insufficient documentation

## 2014-07-21 DIAGNOSIS — M810 Age-related osteoporosis without current pathological fracture: Secondary | ICD-10-CM | POA: Diagnosis not present

## 2014-07-21 DIAGNOSIS — I10 Essential (primary) hypertension: Secondary | ICD-10-CM | POA: Diagnosis not present

## 2014-07-21 DIAGNOSIS — R110 Nausea: Secondary | ICD-10-CM | POA: Insufficient documentation

## 2014-07-21 LAB — COMPREHENSIVE METABOLIC PANEL
ALK PHOS: 48 U/L (ref 38–126)
ALT: 12 U/L — ABNORMAL LOW (ref 14–54)
AST: 28 U/L (ref 15–41)
Albumin: 4.2 g/dL (ref 3.5–5.0)
Anion gap: 11 (ref 5–15)
BILIRUBIN TOTAL: 0.7 mg/dL (ref 0.3–1.2)
BUN: 13 mg/dL (ref 6–20)
CALCIUM: 8.7 mg/dL — AB (ref 8.9–10.3)
CO2: 25 mmol/L (ref 22–32)
CREATININE: 0.64 mg/dL (ref 0.44–1.00)
Chloride: 96 mmol/L — ABNORMAL LOW (ref 101–111)
GFR calc Af Amer: >60 mL/min (ref >60)
Glucose, Bld: 154 mg/dL — ABNORMAL HIGH (ref 65–99)
POTASSIUM: 3.4 mmol/L — AB (ref 3.5–5.1)
Sodium: 132 mmol/L — ABNORMAL LOW (ref 135–145)
Total Protein: 6.6 g/dL (ref 6.5–8.1)

## 2014-07-21 LAB — CBC WITH DIFFERENTIAL/PLATELET
Basophils Absolute: 0 10*3/uL (ref 0.0–0.1)
Basophils Relative: 0 % (ref 0–1)
Eosinophils Absolute: 0 10*3/uL (ref 0.0–0.7)
Eosinophils Relative: 1 % (ref 0–5)
HCT: 34.2 % — ABNORMAL LOW (ref 36.0–46.0)
Hemoglobin: 11.4 g/dL — ABNORMAL LOW (ref 12.0–15.0)
Lymphocytes Relative: 12 % (ref 12–46)
Lymphs Abs: 0.7 10*3/uL (ref 0.7–4.0)
MCH: 30.4 pg (ref 26.0–34.0)
MCHC: 33.3 g/dL (ref 30.0–36.0)
MCV: 91.2 fL (ref 78.0–100.0)
MONO ABS: 0.4 10*3/uL (ref 0.1–1.0)
Monocytes Relative: 7 % (ref 3–12)
Neutro Abs: 4.9 10*3/uL (ref 1.7–7.7)
Neutrophils Relative %: 80 % — ABNORMAL HIGH (ref 43–77)
Platelets: 167 10*3/uL (ref 150–400)
RBC: 3.75 MIL/uL — AB (ref 3.87–5.11)
RDW: 13 % (ref 11.5–15.5)
WBC: 6.1 10*3/uL (ref 4.0–10.5)

## 2014-07-21 LAB — LIPASE, BLOOD: Lipase: 23 U/L (ref 22–51)

## 2014-07-21 MED ORDER — SODIUM CHLORIDE 0.9 % IV BOLUS (SEPSIS)
500.0000 mL | Freq: Once | INTRAVENOUS | Status: AC
  Administered 2014-07-21: 500 mL via INTRAVENOUS

## 2014-07-21 MED ORDER — ONDANSETRON HCL 4 MG/2ML IJ SOLN
4.0000 mg | Freq: Once | INTRAMUSCULAR | Status: AC
  Administered 2014-07-21: 4 mg via INTRAVENOUS
  Filled 2014-07-21: qty 2

## 2014-07-21 MED ORDER — ONDANSETRON 4 MG PO TBDP: 4.0000 mg | ORAL_TABLET | ORAL | Status: DC | PRN

## 2014-07-21 NOTE — Discharge Instructions (Signed)

## 2014-07-21 NOTE — ED Notes (Signed)
Patient was seen at her PCP yesterday for sciatica. Patient was given Toradol. Patient has been nauseated since taking. Patient's daughter states she has to be given an anti-emetic before it goes away.

## 2014-07-21 NOTE — ED Provider Notes (Signed)
CSN: [id number]     Arrival date & time 07/21/14  1717 History   First MD Initiated Contact with Patient 07/21/14 1756     Chief Complaint  Patient presents with  . Nausea  . Diarrhea     (Consider location/radiation/quality/duration/timing/severity/associated sxs/prior Treatment) HPI The patient took a ketorolac yesterday evening that was prescribed for her for sciatica. She has been intensely nauseated ever since. She does not have abdominal pain. There is been no diarrhea or fever. This has been a common occurrence for the patient. At times certain smells can trigger an episode of intractable nausea, she is very sensitive to medications. Her daughter reports that in the past they've had to come to the emergency department several times in short sequence due to intractable nausea from some insight event. Past Medical History  Diagnosis Date  . Osteoporosis   . Hypothyroid   . Hypertension   . Hyperlipidemia    Past Surgical History  Procedure Laterality Date  . Bladder surgery     No family history on file. History  Substance Use Topics  . Smoking status: Never Smoker   . Smokeless tobacco: Not on file  . Alcohol Use: No   OB History    No data available     Review of Systems  10 Systems reviewed and are negative for acute change except as noted in the HPI.   Allergies  Penicillins  Home Medications   Prior to Admission medications   Medication Sig Start Date End Date Taking? Authorizing Provider  aspirin EC 81 MG tablet Take 81 mg by mouth daily.   Yes Historical Provider, MD  CALCIUM-VITAMIN D PO Take 1 tablet by mouth 2 (two) times daily. Calcium 600- D3- 2000   Yes Historical Provider, MD  Cholecalciferol (VITAMIN D3) 2000 UNITS TABS Take 1 tablet by mouth daily.   Yes Historical Provider, MD  esomeprazole (NEXIUM) 40 MG capsule Take 40 mg by mouth daily at 12 noon.   Yes Historical Provider, MD  levothyroxine (SYNTHROID, LEVOTHROID) 50 MCG tablet Take 50 mcg  by mouth daily before breakfast.   Yes Historical Provider, MD  loperamide (IMODIUM) 2 MG capsule Take 1 capsule (2 mg total) by mouth every 8 (eight) hours as needed for diarrhea or loose stools. 04/27/14  Yes Kristen N Ward, DO  losartan (COZAAR) 100 MG tablet Take 100 mg by mouth daily.   Yes Historical Provider, MD  mirabegron ER (MYRBETRIQ) 25 MG TB24 tablet Take 1 mg by mouth daily.   Yes Historical Provider, MD  Multiple Vitamins-Minerals (MULTIVITAMIN & MINERAL PO) Take 1 tablet by mouth daily.   Yes Historical Provider, MD  ondansetron (ZOFRAN ODT) 4 MG disintegrating tablet Take 1 tablet (4 mg total) by mouth every 8 (eight) hours as needed for nausea or vomiting. 04/27/14  Yes Kristen N Ward, DO  pregabalin (LYRICA) 100 MG capsule Take 200 mg by mouth 2 (two) times daily.   Yes Historical Provider, MD  amLODipine (NORVASC) 5 MG tablet Take 10 mg by mouth at bedtime.    Historical Provider, MD  Chlorpheniramine-Acetaminophen (CORICIDIN HBP COLD/FLU PO) Take 1-2 tablets by mouth daily as needed (sinuese/congestion). For congestion    Historical Provider, MD  ondansetron (ZOFRAN ODT) 4 MG disintegrating tablet Take 1 tablet (4 mg total) by mouth every 4 (four) hours as needed for nausea or vomiting. 07/21/14   Arby BarretteMarcy Rori Goar, MD   BP 163/59 mmHg  Pulse 73  Temp(Src) 98 F (36.7 C) (Oral)  Resp  21  Ht  (1.448 m)  Wt 92 lb (41.731 kg)  BMI 19.90 kg/m2  SpO2 96% Physical Exam  Constitutional: She is oriented to person, place, and time. She appears well-developed and well-nourished.  HENT:  Head: Normocephalic and atraumatic.  Eyes: EOM are normal. Pupils are equal, round, and reactive to light.  Neck: Neck supple.  Cardiovascular: Normal rate, regular rhythm, normal heart sounds and intact distal pulses.   Pulmonary/Chest: Effort normal and breath sounds normal.  Abdominal: Soft. Bowel sounds are normal. She exhibits no distension. There is no tenderness.  Musculoskeletal: Normal  range of motion. She exhibits no edema.  Neurological: She is alert and oriented to person, place, and time. She has normal strength. Coordination normal. GCS eye subscore is 4. GCS verbal subscore is 5. GCS motor subscore is 6.  Skin: Skin is warm, dry and intact.  Psychiatric: She has a normal mood and affect.    ED Course  Procedures (including critical care time) Labs Review Labs Reviewed  COMPREHENSIVE METABOLIC PANEL - Abnormal; Notable for the following:    Sodium 132 (*)    Potassium 3.4 (*)    Chloride 96 (*)    Glucose, Bld 154 (*)    Calcium 8.7 (*)    ALT 12 (*)    All other components within normal limits  CBC WITH DIFFERENTIAL/PLATELET - Abnormal; Notable for the following:    RBC 3.75 (*)    Hemoglobin 11.4 (*)    HCT 34.2 (*)    Neutrophils Relative % 80 (*)    All other components within normal limits  LIPASE, BLOOD    Imaging Review No results found.   EKG Interpretation None      MDM   Final diagnoses:  Nausea   The patient is well in appearance. She has a nonsurgical abdomen. These episodes of intractable nausea have been recurrent. She reports usually with fluids and some antiemetics the symptoms improve. Otherwise however does hard to break the cycle at home. I do feel she is safe for discharge and is advised to return if there should be new, changing or worsening symptoms.    Arby Barrette, MD 07/21/14 2110

## 2014-12-28 ENCOUNTER — Encounter (HOSPITAL_COMMUNITY): Payer: Self-pay | Admitting: Emergency Medicine

## 2014-12-28 ENCOUNTER — Emergency Department (HOSPITAL_COMMUNITY)
Admission: EM | Admit: 2014-12-28 | Discharge: 2014-12-28 | Disposition: A | Discharge status: Home / Self Care | Payer: Medicare HMO | Attending: Emergency Medicine | Admitting: Emergency Medicine

## 2014-12-28 DIAGNOSIS — M810 Age-related osteoporosis without current pathological fracture: Secondary | ICD-10-CM | POA: Diagnosis not present

## 2014-12-28 DIAGNOSIS — E039 Hypothyroidism, unspecified: Secondary | ICD-10-CM | POA: Insufficient documentation

## 2014-12-28 DIAGNOSIS — R110 Nausea: Secondary | ICD-10-CM | POA: Diagnosis present

## 2014-12-28 DIAGNOSIS — Z7982 Long term (current) use of aspirin: Secondary | ICD-10-CM | POA: Diagnosis not present

## 2014-12-28 DIAGNOSIS — Z79899 Other long term (current) drug therapy: Secondary | ICD-10-CM | POA: Insufficient documentation

## 2014-12-28 DIAGNOSIS — Z880 Allergy status to penicillin: Secondary | ICD-10-CM | POA: Insufficient documentation

## 2014-12-28 LAB — CBC WITH DIFFERENTIAL/PLATELET
Basophils Absolute: 0 10*3/uL (ref 0.0–0.1)
Basophils Relative: 0 %
Eosinophils Absolute: 0 10*3/uL (ref 0.0–0.7)
Eosinophils Relative: 1 %
HCT: 34.1 % — ABNORMAL LOW (ref 36.0–46.0)
Hemoglobin: 12 g/dL (ref 12.0–15.0)
Lymphocytes Relative: 4 %
Lymphs Abs: 0.3 10*3/uL — ABNORMAL LOW (ref 0.7–4.0)
MCH: 32.8 pg (ref 26.0–34.0)
MCHC: 35.2 g/dL (ref 30.0–36.0)
MCV: 93.2 fL (ref 78.0–100.0)
Monocytes Absolute: 0.5 10*3/uL (ref 0.1–1.0)
Monocytes Relative: 7 %
Neutro Abs: 6.6 10*3/uL (ref 1.7–7.7)
Neutrophils Relative %: 88 %
Platelets: 169 10*3/uL (ref 150–400)
RBC: 3.66 MIL/uL — ABNORMAL LOW (ref 3.87–5.11)
RDW: 13.1 % (ref 11.5–15.5)
WBC: 7.5 10*3/uL (ref 4.0–10.5)

## 2014-12-28 LAB — BASIC METABOLIC PANEL
Anion gap: 6 (ref 5–15)
BUN: 12 mg/dL (ref 6–20)
CO2: 25 mmol/L (ref 22–32)
Calcium: 8.2 mg/dL — ABNORMAL LOW (ref 8.9–10.3)
Chloride: 98 mmol/L — ABNORMAL LOW (ref 101–111)
Creatinine, Ser: 0.55 mg/dL (ref 0.44–1.00)
GFR calc Af Amer: >60 mL/min (ref >60)
GFR calc non Af Amer: >60 mL/min (ref >60)
Glucose, Bld: 140 mg/dL — ABNORMAL HIGH (ref 65–99)
Potassium: 3.4 mmol/L — ABNORMAL LOW (ref 3.5–5.1)
Sodium: 129 mmol/L — ABNORMAL LOW (ref 135–145)

## 2014-12-28 MED ORDER — SODIUM CHLORIDE 0.9 % IV SOLN
INTRAVENOUS | Status: DC
  Administered 2014-12-28: 21:00:00 via INTRAVENOUS

## 2014-12-28 MED ORDER — ONDANSETRON HCL 4 MG/2ML IJ SOLN
4.0000 mg | Freq: Once | INTRAMUSCULAR | Status: AC
  Administered 2014-12-28: 4 mg via INTRAVENOUS
  Filled 2014-12-28: qty 2

## 2014-12-28 NOTE — ED Notes (Signed)
Pt is c/o nausea that started last night  Pt was seen by her PCP and given one liter of NS and discharged  Pt states she continues to have nausea   Denies vomiting or pain

## 2014-12-28 NOTE — ED Provider Notes (Signed)
CSN: [id number]     Arrival date & time 12/28/14  2044 History   First MD Initiated Contact with Patient 12/28/14 2100     Chief Complaint  Patient presents with  . Nausea     (Consider location/radiation/quality/duration/timing/severity/associated sxs/prior Treatment) HPI  89yf presenting with daughter for evaluation of nausea. Apparently a history of recurrent nausea. Worsening over past day. Even smell of food makes her very ill. No vomiting. Seem by pcp for same yesterday some improvement with ivf now worse again. Denies pain. No fever or chills. No diarrhea. No urinary complaints.   Past Medical History  Diagnosis Date  . Osteoporosis   . Hypothyroid   . Hypertension   . Hyperlipidemia    Past Surgical History  Procedure Laterality Date  . Bladder surgery     No family history on file. Social History  Substance Use Topics  . Smoking status: Never Smoker   . Smokeless tobacco: None  . Alcohol Use: No   OB History    No data available     Review of Systems  All systems reviewed and negative, other than as noted in HPI.   Allergies  Penicillins  Home Medications   Prior to Admission medications   Medication Sig Start Date End Date Taking? Authorizing Provider  amLODipine (NORVASC) 5 MG tablet Take 10 mg by mouth at bedtime.   Yes Historical Provider, MD  aspirin EC 81 MG tablet Take 81 mg by mouth daily.   Yes Historical Provider, MD  CALCIUM-VITAMIN D PO Take 1 tablet by mouth 2 (two) times daily. Calcium 600- D3- 2000   Yes Historical Provider, MD  Cholecalciferol (VITAMIN D3) 2000 UNITS TABS Take 1 tablet by mouth daily.   Yes Historical Provider, MD  esomeprazole (NEXIUM) 40 MG capsule Take 40 mg by mouth daily at 12 noon.   Yes Historical Provider, MD  levothyroxine (SYNTHROID, LEVOTHROID) 50 MCG tablet Take 50 mcg by mouth daily before breakfast.   Yes Historical Provider, MD  losartan (COZAAR) 100 MG tablet Take 100 mg by mouth daily.   Yes Historical  Provider, MD  mirabegron ER (MYRBETRIQ) 25 MG TB24 tablet Take 1 mg by mouth daily.   Yes Historical Provider, MD  Multiple Vitamins-Minerals (MULTIVITAMIN & MINERAL PO) Take 1 tablet by mouth daily.   Yes Historical Provider, MD  ondansetron (ZOFRAN ODT) 4 MG disintegrating tablet Take 1 tablet (4 mg total) by mouth every 8 (eight) hours as needed for nausea or vomiting. 04/27/14  Yes Kristen N Ward, DO  pregabalin (LYRICA) 100 MG capsule Take 200 mg by mouth 2 (two) times daily.   Yes Historical Provider, MD  Chlorpheniramine-Acetaminophen (CORICIDIN HBP COLD/FLU PO) Take 1-2 tablets by mouth daily as needed (sinuese/congestion). For congestion    Historical Provider, MD  loperamide (IMODIUM) 2 MG capsule Take 1 capsule (2 mg total) by mouth every 8 (eight) hours as needed for diarrhea or loose stools. 04/27/14   Kristen N Ward, DO  ondansetron (ZOFRAN ODT) 4 MG disintegrating tablet Take 1 tablet (4 mg total) by mouth every 4 (four) hours as needed for nausea or vomiting. Patient not taking: Reported on 12/28/2014 07/21/14   Arby BarretteMarcy Pfeiffer, MD   BP 160/56 mmHg  Pulse 99  Temp(Src) 98.1 F (36.7 C) (Oral)  Resp 18  Ht 4\' 6"  (1.372 m)  Wt 95 lb (43.092 kg)  BMI 22.89 kg/m2  SpO2 98% Physical Exam  Constitutional: She appears well-developed and well-nourished. No distress.  HENT:  Head:  Normocephalic and atraumatic.  Eyes: Conjunctivae are normal. Right eye exhibits no discharge. Left eye exhibits no discharge.  Neck: Neck supple.  Cardiovascular: Normal rate, regular rhythm and normal heart sounds.  Exam reveals no gallop and no friction rub.   No murmur heard. Pulmonary/Chest: Effort normal and breath sounds normal. No respiratory distress.  Abdominal: Soft. She exhibits no distension. There is no tenderness.  Musculoskeletal: She exhibits no edema or tenderness.  Neurological: She is alert.  Skin: Skin is warm and dry.  Psychiatric: She has a normal mood and affect. Her behavior is  normal. Thought content normal.  Nursing note and vitals reviewed.   ED Course  Procedures (including critical care time) Labs Review Labs Reviewed  BASIC METABOLIC PANEL - Abnormal; Notable for the following:    Sodium 129 (*)    Potassium 3.4 (*)    Chloride 98 (*)    Glucose, Bld 140 (*)    Calcium 8.2 (*)    All other components within normal limits  CBC WITH DIFFERENTIAL/PLATELET - Abnormal; Notable for the following:    RBC 3.66 (*)    HCT 34.1 (*)    Lymphs Abs 0.3 (*)    All other components within normal limits  URINALYSIS, ROUTINE W REFLEX MICROSCOPIC (NOT AT [facility])    Imaging Review No results found. I have personally reviewed and evaluated these images and lab results as part of my medical decision-making.   EKG Interpretation None      MDM   Final diagnoses:  Nausea    89yf with nausea. Denies pain. Apparently history of recurrent nausea. Currently feels much better after additonal meds and ivf. Hyponatremia noted. Chronic to some degree. It has been determined that no acute conditions requiring further emergency intervention are present at this time. The patient has been advised of the diagnosis and plan. I reviewed any labs and imaging including any potential incidental findings. We have discussed signs and symptoms that warrant return to the ED and they are listed in the discharge instructions.      Raeford Razor, MD 01/07/15 2152

## 2014-12-28 NOTE — Progress Notes (Signed)
Patient listed as having [employer] insurance without a pcp.  EDCM spoke to patient's daughter at bedside, patient resting under the covers.  Patient's daughter reports patient's pcp is Dr. Bertram Millardebecca Beverly.  System updated.

## 2014-12-28 NOTE — ED Notes (Signed)
Patient was alert, oriented and stable upon discharge. RN went over AVS and patient had no further questions.  

## 2014-12-28 NOTE — Discharge Instructions (Signed)
Nausea, Adult °Nausea is the feeling that you have an upset stomach or have to vomit. Nausea by itself is not likely a serious concern, but it may be an early sign of more serious medical problems. As nausea gets worse, it can lead to vomiting. If vomiting develops, there is the risk of dehydration.  °CAUSES  °· Viral infections. °· Food poisoning. °· Medicines. °· Pregnancy. °· Motion sickness. °· Migraine headaches. °· Emotional distress. °· Severe pain from any source. °· Alcohol intoxication. °HOME CARE INSTRUCTIONS °· Get plenty of rest. °· Ask your caregiver about specific rehydration instructions. °· Eat small amounts of food and sip liquids more often. °· Take all medicines as told by your caregiver. °SEEK MEDICAL CARE IF: °· You have not improved after 2 days, or you get worse. °· You have a headache. °SEEK IMMEDIATE MEDICAL CARE IF:  °· You have a fever. °· You faint. °· You keep vomiting or have blood in your vomit. °· You are extremely weak or dehydrated. °· You have dark or bloody stools. °· You have severe chest or abdominal pain. °MAKE SURE YOU: °· Understand these instructions. °· Will watch your condition. °· Will get help right away if you are not doing well or get worse. °  °This information is not intended to replace advice given to you by your health care provider. Make sure you discuss any questions you have with your health care provider. °  °Document Released: 03/23/2004 Document Revised: 03/06/2014 Document Reviewed: 10/26/2010 °Elsevier Interactive Patient Education ©2016 [employer] ° °

## 2015-01-23 ENCOUNTER — Observation Stay (HOSPITAL_COMMUNITY)
Admission: EM | Admit: 2015-01-23 | Discharge: 2015-01-24 | Disposition: A | Discharge status: Home / Self Care | Payer: Medicare HMO | Attending: Internal Medicine | Admitting: Internal Medicine

## 2015-01-23 ENCOUNTER — Encounter (HOSPITAL_COMMUNITY): Payer: Self-pay | Admitting: Emergency Medicine

## 2015-01-23 DIAGNOSIS — R197 Diarrhea, unspecified: Secondary | ICD-10-CM | POA: Diagnosis present

## 2015-01-23 DIAGNOSIS — Z7982 Long term (current) use of aspirin: Secondary | ICD-10-CM | POA: Insufficient documentation

## 2015-01-23 DIAGNOSIS — R110 Nausea: Secondary | ICD-10-CM

## 2015-01-23 DIAGNOSIS — Z79899 Other long term (current) drug therapy: Secondary | ICD-10-CM | POA: Diagnosis not present

## 2015-01-23 DIAGNOSIS — K219 Gastro-esophageal reflux disease without esophagitis: Secondary | ICD-10-CM | POA: Diagnosis not present

## 2015-01-23 DIAGNOSIS — I10 Essential (primary) hypertension: Secondary | ICD-10-CM | POA: Diagnosis not present

## 2015-01-23 DIAGNOSIS — R627 Adult failure to thrive: Principal | ICD-10-CM | POA: Diagnosis present

## 2015-01-23 DIAGNOSIS — E039 Hypothyroidism, unspecified: Secondary | ICD-10-CM | POA: Diagnosis not present

## 2015-01-23 DIAGNOSIS — E785 Hyperlipidemia, unspecified: Secondary | ICD-10-CM | POA: Diagnosis not present

## 2015-01-23 LAB — CBC
HCT: 36.6 % (ref 36.0–46.0)
HEMOGLOBIN: 12.6 g/dL (ref 12.0–15.0)
MCH: 32.3 pg (ref 26.0–34.0)
MCHC: 34.4 g/dL (ref 30.0–36.0)
MCV: 93.8 fL (ref 78.0–100.0)
Platelets: 194 10*3/uL (ref 150–400)
RBC: 3.9 MIL/uL (ref 3.87–5.11)
RDW: 13.3 % (ref 11.5–15.5)
WBC: 5.2 10*3/uL (ref 4.0–10.5)

## 2015-01-23 LAB — COMPREHENSIVE METABOLIC PANEL
ALBUMIN: 3.9 g/dL (ref 3.5–5.0)
ALK PHOS: 48 U/L (ref 38–126)
ALT: 15 U/L (ref 14–54)
AST: 31 U/L (ref 15–41)
Anion gap: 9 (ref 5–15)
BUN: 14 mg/dL (ref 6–20)
CO2: 26 mmol/L (ref 22–32)
CREATININE: 0.67 mg/dL (ref 0.44–1.00)
Calcium: 8.6 mg/dL — ABNORMAL LOW (ref 8.9–10.3)
Chloride: 100 mmol/L — ABNORMAL LOW (ref 101–111)
GFR calc Af Amer: >60 mL/min (ref >60)
GFR calc non Af Amer: >60 mL/min (ref >60)
Glucose, Bld: 116 mg/dL — ABNORMAL HIGH (ref 65–99)
Potassium: 3.7 mmol/L (ref 3.5–5.1)
SODIUM: 135 mmol/L (ref 135–145)
Total Bilirubin: 0.4 mg/dL (ref 0.3–1.2)
Total Protein: 6.4 g/dL — ABNORMAL LOW (ref 6.5–8.1)

## 2015-01-23 LAB — URINALYSIS, ROUTINE W REFLEX MICROSCOPIC
BILIRUBIN URINE: NEGATIVE
Glucose, UA: NEGATIVE mg/dL
Hgb urine dipstick: NEGATIVE
KETONES UR: NEGATIVE mg/dL
Leukocytes, UA: NEGATIVE
Nitrite: NEGATIVE
Protein, ur: NEGATIVE mg/dL
SPECIFIC GRAVITY, URINE: 1.011 (ref 1.005–1.030)
pH: 7.5 (ref 5.0–8.0)

## 2015-01-23 LAB — LIPASE, BLOOD: Lipase: 36 U/L (ref 11–51)

## 2015-01-23 LAB — TROPONIN I: Troponin I: <0.03 ng/mL (ref <0.031)

## 2015-01-23 MED ORDER — LEVOTHYROXINE SODIUM 50 MCG PO TABS
50.0000 ug | ORAL_TABLET | Freq: Every day | ORAL | Status: DC
  Administered 2015-01-24: 50 ug via ORAL
  Filled 2015-01-23 (×2): qty 1

## 2015-01-23 MED ORDER — SODIUM CHLORIDE 0.9 % IV SOLN
INTRAVENOUS | Status: DC
  Administered 2015-01-23 (×3): via INTRAVENOUS

## 2015-01-23 MED ORDER — ADULT MULTIVITAMIN W/MINERALS CH
1.0000 | ORAL_TABLET | Freq: Every day | ORAL | Status: DC
  Administered 2015-01-24: 1 via ORAL
  Filled 2015-01-23 (×2): qty 1

## 2015-01-23 MED ORDER — VITAMIN D 1000 UNITS PO TABS
1000.0000 [IU] | ORAL_TABLET | Freq: Every day | ORAL | Status: DC
  Administered 2015-01-24: 1000 [IU] via ORAL
  Filled 2015-01-23 (×2): qty 1

## 2015-01-23 MED ORDER — CALCIUM CARBONATE-VITAMIN D 500-200 MG-UNIT PO TABS
1.0000 | ORAL_TABLET | Freq: Two times a day (BID) | ORAL | Status: DC
  Administered 2015-01-24: 1 via ORAL
  Filled 2015-01-23 (×3): qty 1

## 2015-01-23 MED ORDER — ONDANSETRON HCL 4 MG PO TABS: 4.0000 mg | ORAL_TABLET | Freq: Four times a day (QID) | ORAL | Status: DC | PRN

## 2015-01-23 MED ORDER — ENOXAPARIN SODIUM 30 MG/0.3ML ~~LOC~~ SOLN
30.0000 mg | SUBCUTANEOUS | Status: DC
  Administered 2015-01-23: 30 mg via SUBCUTANEOUS
  Filled 2015-01-23 (×2): qty 0.3

## 2015-01-23 MED ORDER — FAMOTIDINE IN NACL 20-0.9 MG/50ML-% IV SOLN
20.0000 mg | Freq: Once | INTRAVENOUS | Status: AC
  Administered 2015-01-23: 20 mg via INTRAVENOUS
  Filled 2015-01-23: qty 50

## 2015-01-23 MED ORDER — SODIUM CHLORIDE 0.9 % IV BOLUS (SEPSIS)
500.0000 mL | Freq: Once | INTRAVENOUS | Status: AC
  Administered 2015-01-23: 500 mL via INTRAVENOUS

## 2015-01-23 MED ORDER — ACETAMINOPHEN 650 MG RE SUPP: 650.0000 mg | Freq: Four times a day (QID) | RECTAL | Status: DC | PRN

## 2015-01-23 MED ORDER — AMLODIPINE BESYLATE 10 MG PO TABS
10.0000 mg | ORAL_TABLET | Freq: Every day | ORAL | Status: DC
  Administered 2015-01-23: 10 mg via ORAL
  Filled 2015-01-23 (×2): qty 1

## 2015-01-23 MED ORDER — ACETAMINOPHEN 325 MG PO TABS: 650.0000 mg | ORAL_TABLET | Freq: Four times a day (QID) | ORAL | Status: DC | PRN

## 2015-01-23 MED ORDER — MIRABEGRON ER 25 MG PO TB24
25.0000 mg | ORAL_TABLET | Freq: Every day | ORAL | Status: DC
  Administered 2015-01-23 – 2015-01-24 (×2): 25 mg via ORAL
  Filled 2015-01-23 (×2): qty 1

## 2015-01-23 MED ORDER — ONDANSETRON HCL 4 MG/2ML IJ SOLN
4.0000 mg | Freq: Once | INTRAMUSCULAR | Status: AC
  Administered 2015-01-23: 4 mg via INTRAVENOUS
  Filled 2015-01-23: qty 2

## 2015-01-23 MED ORDER — LOSARTAN POTASSIUM 50 MG PO TABS
100.0000 mg | ORAL_TABLET | Freq: Every day | ORAL | Status: DC
  Administered 2015-01-23 – 2015-01-24 (×2): 100 mg via ORAL
  Filled 2015-01-23 (×2): qty 2

## 2015-01-23 MED ORDER — ONDANSETRON HCL 8 MG PO TABS: 8.0000 mg | ORAL_TABLET | Freq: Three times a day (TID) | ORAL | Status: DC | PRN

## 2015-01-23 MED ORDER — ONDANSETRON HCL 4 MG/2ML IJ SOLN
4.0000 mg | Freq: Four times a day (QID) | INTRAMUSCULAR | Status: DC | PRN
  Administered 2015-01-23: 4 mg via INTRAVENOUS
  Filled 2015-01-23: qty 2

## 2015-01-23 MED ORDER — SODIUM CHLORIDE 0.9 % IV BOLUS (SEPSIS)
1000.0000 mL | Freq: Once | INTRAVENOUS | Status: AC
  Administered 2015-01-23: 1000 mL via INTRAVENOUS

## 2015-01-23 MED ORDER — LOPERAMIDE HCL 2 MG PO CAPS: 2.0000 mg | ORAL_CAPSULE | Freq: Three times a day (TID) | ORAL | Status: DC | PRN

## 2015-01-23 MED ORDER — PANTOPRAZOLE SODIUM 40 MG IV SOLR
40.0000 mg | Freq: Two times a day (BID) | INTRAVENOUS | Status: DC
  Administered 2015-01-24: 40 mg via INTRAVENOUS
  Filled 2015-01-23 (×2): qty 40

## 2015-01-23 MED ORDER — ASPIRIN EC 81 MG PO TBEC
81.0000 mg | DELAYED_RELEASE_TABLET | Freq: Every day | ORAL | Status: DC
  Administered 2015-01-23 – 2015-01-24 (×2): 81 mg via ORAL
  Filled 2015-01-23 (×2): qty 1

## 2015-01-23 MED ORDER — PREGABALIN 100 MG PO CAPS
200.0000 mg | ORAL_CAPSULE | Freq: Two times a day (BID) | ORAL | Status: DC
  Administered 2015-01-23 – 2015-01-24 (×2): 200 mg via ORAL
  Filled 2015-01-23 (×2): qty 2

## 2015-01-23 MED ORDER — METOCLOPRAMIDE HCL 5 MG/ML IJ SOLN
10.0000 mg | Freq: Once | INTRAMUSCULAR | Status: AC
  Administered 2015-01-23: 10 mg via INTRAVENOUS
  Filled 2015-01-23: qty 2

## 2015-01-23 NOTE — ED Notes (Signed)
Pt BIB daughter.  C/o nausea. States that she has already been here for same.  States one instance of diarrhea.  No vomiting.

## 2015-01-23 NOTE — Progress Notes (Signed)
Lovenox per Pharmacy for DVT Prophylaxis    Pharmacy has been consulted from dosing enoxaparin (lovenox) in this patient for DVT prophylaxis.  The pharmacist has reviewed pertinent labs (Hgb _12.6__; PLT_194__), patient weight (__43_kg) and renal function (CrCl_25__mL/min) and decided that enoxaparin _30_mg SQ Q_24_Hrs is appropriate for this patient.  The pharmacy department will sign off at this time.  Please reconsult pharmacy if status changes or for further issues.  Thank you  Luetta NuttingJulian Crowford Yianni Skilling, Jr PharmD, BCPS  01/23/2015, 10:06 PM

## 2015-01-23 NOTE — H&P (Signed)
[facility] History and Physical  Pamela Peterson ZOX:[id number] DOB: 30-Jan-1920 DOA: 01/23/2015  Referring physician:ED PCP: Leola Brazil, DO   Chief Complaint: Nausea  HPI:  Ms. Pamela Peterson is a 81 year old female with past medical history significant for osteoporosis, hypothyroidism, hypertension, and GERD; who presents mainly with complaints of nausea. Symptoms started a yesterday morning around 2 AM. She had a bout of diarrhea where patient noted at least 4-5 loose watery stools. She states just feeling nauseous and malaise. Patient had not had any of her medications are really wanted to eat and that's why her daughter brought her in today. She's had previous bouts like this in the past the last was sometime in October. She was treated in the ED with IV fluids and antinausea medication and got better.  However, today symptoms have persisted and she still not wanting to eat or drink anything. Daughter was present at bedside and provided additional history states that she normally gets like this has episodes like this every few months or so but had previously been brought on by certain smells. The last 2 times though they were not able to identify a specific source or smell to precipitate symptoms. Before leaving on the room to daughter also notes that the patient had not had her Nexium for the last 2 days prior to onset of symptoms as she ran out of her prescription.  Upon admission to the emergency department laboratory studies, UA, and vitals were within normal limits  Review of Systems  Constitutional: Positive for malaise/fatigue. Negative for chills, weight loss and diaphoresis.  HENT: Positive for hearing loss. Negative for ear pain.   Eyes: Negative for double vision and photophobia.  Respiratory: Negative for hemoptysis, sputum production and shortness of breath.   Cardiovascular: Negative for chest pain, palpitations and leg swelling.  Gastrointestinal: Positive for nausea and  diarrhea. Negative for vomiting.  Genitourinary: Negative for dysuria, urgency, frequency and hematuria.  Musculoskeletal: Positive for joint pain. Negative for neck pain.  Skin: Negative for itching and rash.  Neurological: Negative for sensory change and speech change.  Endo/Heme/Allergies: Negative for polydipsia. Bruises/bleeds easily.  Psychiatric/Behavioral: Negative for suicidal ideas and substance abuse.         Past Medical History  Diagnosis Date  . Osteoporosis   . Hypothyroid   . Hypertension   . Hyperlipidemia      Past Surgical History  Procedure Laterality Date  . Bladder surgery        Social History:  reports that she has never smoked. She does not have any smokeless tobacco history on file. She reports that she does not drink alcohol or use illicit drugs.  Where does patient live--home  Can patient participate in ADLs? Yes  Allergies  Allergen Reactions  . Penicillins Hives and Swelling    Has patient had a PCN reaction causing immediate rash, facial/tongue/throat swelling, SOB or lightheadedness with hypotension: no Has patient had a PCN reaction causing severe rash involving mucus membranes or skin necrosis: no Has patient had a PCN reaction that required hospitalization already in hospital  Has patient had a PCN reaction occurring within the last 10 years: no If all of the above answers are "NO", then may proceed with Cephalosporin use.     No family history on file.     Prior to Admission medications   Medication Sig Start Date End Date Taking? Authorizing Provider  Acetaminophen (TYLENOL PO) Take 2 tablets by mouth every 6 (six) hours as needed (pain).  Yes Historical Provider, MD  amLODipine (NORVASC) 10 MG tablet Take 10 mg by mouth at bedtime.   Yes Historical Provider, MD  aspirin EC 81 MG tablet Take 81 mg by mouth daily.   Yes Historical Provider, MD  CALCIUM-VITAMIN D PO Take 1 tablet by mouth 2 (two) times daily. Calcium 600- D3-  2000   Yes Historical Provider, MD  Chlorpheniramine-Acetaminophen (CORICIDIN HBP COLD/FLU PO) Take 1-2 tablets by mouth daily as needed (sinuese/congestion). For congestion   Yes Historical Provider, MD  Cholecalciferol (VITAMIN D3) 2000 UNITS TABS Take 1 tablet by mouth daily.   Yes Historical Provider, MD  esomeprazole (NEXIUM) 40 MG capsule Take 40 mg by mouth daily at 12 noon.   Yes Historical Provider, MD  levothyroxine (SYNTHROID, LEVOTHROID) 50 MCG tablet Take 50 mcg by mouth daily before breakfast.   Yes Historical Provider, MD  loperamide (IMODIUM) 2 MG capsule Take 1 capsule (2 mg total) by mouth every 8 (eight) hours as needed for diarrhea or loose stools. 04/27/14  Yes Kristen N Ward, DO  losartan (COZAAR) 100 MG tablet Take 100 mg by mouth daily.   Yes Historical Provider, MD  mirabegron ER (MYRBETRIQ) 25 MG TB24 tablet Take 25 mg by mouth daily.    Yes Historical Provider, MD  Multiple Vitamins-Minerals (MULTIVITAMIN & MINERAL PO) Take 1 tablet by mouth daily.   Yes Historical Provider, MD  pregabalin (LYRICA) 100 MG capsule Take 200 mg by mouth 2 (two) times daily.   Yes Historical Provider, MD  ondansetron (ZOFRAN) 8 MG tablet Take 1 tablet (8 mg total) by mouth every 8 (eight) hours as needed for nausea or vomiting. 01/23/15   Mancel BaleElliott Wentz, MD     Physical Exam: Filed Vitals:   01/23/15 1400 01/23/15 1415 01/23/15 1430 01/23/15 2003  BP: 162/71  155/71 143/72  Pulse: 79 78 80 82  Temp:    98.7 F (37.1 C)  TempSrc:    Oral  Resp:    18  SpO2: 96% 94% 98% 100%     Constitutional: Vital signs reviewed. Patient is a elderly female well-developed and well-nourished in no acute distress and cooperative with exam. Alert and oriented x3.  Head: Normocephalic and atraumatic  Ear: TM normal bilaterally  Mouth: no erythema or exudates, dry mucous membranes Eyes: PERRL, EOMI, conjunctivae normal, No scleral icterus.  Neck: Supple, Trachea midline normal ROM, No JVD, mass,  thyromegaly, or carotid bruit present.  Cardiovascular: RRR, S1 normal, S2 normal, no MRG, pulses symmetric and intact bilaterally  Pulmonary/Chest: CTAB, no wheezes, rales, or rhonchi  Abdominal: Soft. Non-tender, non-distended, bowel sounds are normal, no masses, organomegaly, or guarding present.  GU: no CVA tenderness Musculoskeletal: No joint deformities, erythema, or stiffness, ROM full and no nontender Ext: no edema and no cyanosis, pulses palpable bilaterally (DP and PT)  Hematology: no cervical, inginal, or axillary adenopathy.  Neurological: A&O x3, Strenght is normal and symmetric bilaterally, cranial nerve II-XII are grossly intact, no focal motor deficit, sensory intact to light touch bilaterally.  Skin: Warm, dry and intact. Poor skin turgor with slow recoil. No rash, cyanosis, or clubbing.  Psychiatric: Normal mood and affect. speech and behavior is normal. Judgment and thought content normal. Cognition and memory are normal.      Data Review   Micro Results No results found for this or any previous visit (from the past 240 hour(s)).  Radiology Reports No results found.   CBC  Recent Labs Lab 01/23/15 1042  WBC 5.2  HGB 12.6  HCT 36.6  PLT 194  MCV 93.8  MCH 32.3  MCHC 34.4  RDW 13.3    Chemistries   Recent Labs Lab 01/23/15 1042  NA 135  K 3.7  CL 100*  CO2 26  GLUCOSE 116*  BUN 14  CREATININE 0.67  CALCIUM 8.6*  AST 31  ALT 15  ALKPHOS 48  BILITOT 0.4   ------------------------------------------------------------------------------------------------------------------ CrCl cannot be calculated (Unknown ideal weight.). ------------------------------------------------------------------------------------------------------------------ No results for input(s): HGBA1C in the last 72 hours. ------------------------------------------------------------------------------------------------------------------ No results for input(s): CHOL, HDL, LDLCALC,  TRIG, CHOLHDL, LDLDIRECT in the last 72 hours. ------------------------------------------------------------------------------------------------------------------ No results for input(s): TSH, T4TOTAL, T3FREE, THYROIDAB in the last 72 hours.  Invalid input(s): FREET3 ------------------------------------------------------------------------------------------------------------------ No results for input(s): VITAMINB12, FOLATE, FERRITIN, TIBC, IRON, RETICCTPCT in the last 72 hours.  Coagulation profile No results for input(s): INR, PROTIME in the last 168 hours.  No results for input(s): DDIMER in the last 72 hours.  Cardiac Enzymes No results for input(s): CKMB, TROPONINI, MYOGLOBIN in the last 168 hours.  Invalid input(s): CK ------------------------------------------------------------------------------------------------------------------ Invalid input(s): POCBNP   CBG: No results for input(s): GLUCAP in the last 168 hours.     EKG: Ordered   Assessment/Plan Active Problems:   Failure to thrive in adult: Acute. Patient with symptoms for approximately 2 days now. Mainly complains of nausea without vomiting. Patient with decreased by mouth intake and refused all meds. Exact cause is not known at this time. Rule out possible causes including thyroid, infection, cardiac. UA negative for UTI.  -Checking TSH, EKG, troponin, chest x-ray -Advance diet as tolerated  Nausea without vomiting: Acute on chronic. Symptoms have been every few months or so per family previously brought on by smells. However exact cause has been found for the last 2 episodes like this in the past. She noted to improve with generally IV fluids and anti-emetics. Patient had received at least 2 L while in the ED. -IV fluids at 75 mL per hour -IV Antiemetics prn nausea -Strict ins and outs -Restarted acid reflux medicine as this could have also could have precipitated current symptoms  Hypothyroidism:  -Follow-up  TSH level -Continue levothyroxine at current dose unless adjustment needed    Diarrhea: Appears to be resolved. Patient reports 1 day of proximally 4-5 loose stools. Family denied any recent history of antibiotics. -loperamide prn diarrhea -Further workup warranted if diarrhea persists      Essential hypertension -Continue Cozaar and amlodipine    GERD (gastroesophageal reflux disease) -Protonix IV will need to be changed to by mouth when tolerating   Code Status:   full Family Communication: bedside Disposition Plan: admit   Total time spent 55 minutes.Greater than 50% of this time was spent in counseling, explanation of diagnosis, planning of further management, and coordination of care  Clydie Braun [facility] Pager [phone]  If 7PM-7AM, please contact night-coverage [email or web address] Password [facility] 01/23/2015, 9:48 PM

## 2015-01-23 NOTE — ED Notes (Signed)
Tried to call report.

## 2015-01-23 NOTE — ED Provider Notes (Signed)
Care was taken over from Dr. Effie ShyWentz. Patient has recurrent bouts of nausea. She came to the ED today for nausea. She has no associated abdominal pain. This is similar to other episodes that she's had. Typically she responds in the ED to IV fluids and Zofran. Today she has had no improvement. She's had one and half liters of saline. She's had 2 doses of Zofran and 1 dose of Reglan. She is not reporting any improvement in symptoms. Her daughter does not fill comfortable taking her home. I consulted Dr. Katrinka BlazingSmith with the hospitalist service to admit the patient for observation.  Rolan BuccoMelanie Lakoda Raske, MD 01/23/15 2039

## 2015-01-23 NOTE — ED Provider Notes (Signed)
CSN: [id number]     Arrival date & time 01/23/15  1021 History   First MD Initiated Contact with Patient 01/23/15 1044     Chief Complaint  Patient presents with  . Nausea  . Diarrhea     (Consider location/radiation/quality/duration/timing/severity/associated sxs/prior Treatment) HPI   Pamela Peterson is a 81 y.o. female who presents for evaluation of recurrent nausea. She is in the ED for same, about 3 weeks ago. Her daughter states that she has a "very sensitive sense of smell" which frequently causes her to have nausea and not drink fluids, when encountering new smells. She complains of nausea since last night. She has had 5 loose. All movements, brown in color. Since that time. There's been no fever, vomiting, cough, chest pain, focal weakness or dizziness. Patient feels like she will improve if she receives IV fluid. She is taking her usual medications. There are no other no modifying factors.  Past Medical History  Diagnosis Date  . Osteoporosis   . Hypothyroid   . Hypertension   . Hyperlipidemia    Past Surgical History  Procedure Laterality Date  . Bladder surgery     No family history on file. Social History  Substance Use Topics  . Smoking status: Never Smoker   . Smokeless tobacco: None  . Alcohol Use: No   OB History    No data available     Review of Systems  All other systems reviewed and are negative.     Allergies  Penicillins  Home Medications   Prior to Admission medications   Medication Sig Start Date End Date Taking? Authorizing Provider  Acetaminophen (TYLENOL PO) Take 2 tablets by mouth every 6 (six) hours as needed (pain).   Yes Historical Provider, MD  amLODipine (NORVASC) 10 MG tablet Take 10 mg by mouth at bedtime.   Yes Historical Provider, MD  aspirin EC 81 MG tablet Take 81 mg by mouth daily.   Yes Historical Provider, MD  CALCIUM-VITAMIN D PO Take 1 tablet by mouth 2 (two) times daily. Calcium 600- D3- 2000   Yes Historical Provider, MD   Chlorpheniramine-Acetaminophen (CORICIDIN HBP COLD/FLU PO) Take 1-2 tablets by mouth daily as needed (sinuese/congestion). For congestion   Yes Historical Provider, MD  Cholecalciferol (VITAMIN D3) 2000 UNITS TABS Take 1 tablet by mouth daily.   Yes Historical Provider, MD  esomeprazole (NEXIUM) 40 MG capsule Take 40 mg by mouth daily at 12 noon.   Yes Historical Provider, MD  levothyroxine (SYNTHROID, LEVOTHROID) 50 MCG tablet Take 50 mcg by mouth daily before breakfast.   Yes Historical Provider, MD  loperamide (IMODIUM) 2 MG capsule Take 1 capsule (2 mg total) by mouth every 8 (eight) hours as needed for diarrhea or loose stools. 04/27/14  Yes Kristen N Ward, DO  losartan (COZAAR) 100 MG tablet Take 100 mg by mouth daily.   Yes Historical Provider, MD  mirabegron ER (MYRBETRIQ) 25 MG TB24 tablet Take 25 mg by mouth daily.    Yes Historical Provider, MD  Multiple Vitamins-Minerals (MULTIVITAMIN & MINERAL PO) Take 1 tablet by mouth daily.   Yes Historical Provider, MD  pregabalin (LYRICA) 100 MG capsule Take 200 mg by mouth 2 (two) times daily.   Yes Historical Provider, MD  ondansetron (ZOFRAN) 8 MG tablet Take 1 tablet (8 mg total) by mouth every 8 (eight) hours as needed for nausea or vomiting. 01/23/15   Mancel Bale, MD   BP 155/71 mmHg  Pulse 80  Temp(Src) 98.9 F (37.2  C) (Oral)  Resp 18  SpO2 98% Physical Exam  Constitutional: She is oriented to person, place, and time. She appears well-developed.  Elderly, frail  HENT:  Head: Normocephalic and atraumatic.  Right Ear: External ear normal.  Left Ear: External ear normal.  Eyes: Conjunctivae and EOM are normal. Pupils are equal, round, and reactive to light.  Neck: Normal range of motion and phonation normal. Neck supple.  Cardiovascular: Normal rate, regular rhythm and normal heart sounds.   Pulmonary/Chest: Effort normal and breath sounds normal. She exhibits no bony tenderness.  Abdominal: Soft. There is no tenderness.   Musculoskeletal: Normal range of motion.  Neurological: She is alert and oriented to person, place, and time. No cranial nerve deficit or sensory deficit. She exhibits normal muscle tone. Coordination normal.  No dysarthria or aphasia. No pronator drift.  Skin: Skin is warm, dry and intact.  Psychiatric: She has a normal mood and affect. Her behavior is normal.  Nursing note and vitals reviewed.   ED Course  Procedures (including critical care time) Medications  0.9 %  sodium chloride infusion ( Intravenous New Bag/Given 01/23/15 1348)  sodium chloride 0.9 % bolus 500 mL (0 mLs Intravenous Stopped 01/23/15 1349)  ondansetron (ZOFRAN) injection 4 mg (4 mg Intravenous Given 01/23/15 1534)  sodium chloride 0.9 % bolus 1,000 mL (1,000 mLs Intravenous New Bag/Given 01/23/15 1630)    Patient Vitals for the past 24 hrs:  BP Temp Temp src Pulse Resp SpO2  01/23/15 1430 155/71 mmHg - - 80 - 98 %  01/23/15 1415 - - - 78 - 94 %  01/23/15 1400 162/71 mmHg - - 79 - 96 %  01/23/15 1345 - - - 78 - 95 %  01/23/15 1330 151/71 mmHg - - 83 - 93 %  01/23/15 1315 - - - 86 - 96 %  01/23/15 1300 150/72 mmHg - - 78 - 97 %  01/23/15 1244 164/73 mmHg 98.9 F (37.2 C) Oral 85 18 100 %  01/23/15 1026 140/79 mmHg 99 F (37.2 C) Oral 86 16 100 %    3:30 PM Reevaluation with update and discussion. After initial assessment and treatment, an updated evaluation reveals she continues to be uncomfortable, despite treatment with IV fluids. Zofran ordered. [name]   4:16 PM Reevaluation with update and discussion. After initial assessment and treatment, an updated evaluation reveals patient still somewhat uncomfortable. She has been taking Zofran 4 mg orally at home. Daughter thinks that patient needs another liter of fluid, biopsy, prior to leaving. Otherwise, "she will have to come back". [name]    Labs Review Labs Reviewed  COMPREHENSIVE METABOLIC PANEL - Abnormal; Notable for the following:     Chloride 100 (*)    Glucose, Bld 116 (*)    Calcium 8.6 (*)    Total Protein 6.4 (*)    All other components within normal limits  LIPASE, BLOOD  CBC  URINALYSIS, ROUTINE W REFLEX MICROSCOPIC (NOT AT [facility])    Imaging Review No results found. I have personally reviewed and evaluated these images and lab results as part of my medical decision-making.   EKG Interpretation None      MDM   Final diagnoses:  Nausea    Recurrent nausea, without vomiting. No evidence for acute metabolic or infectious process. Vital signs are stable.  Nursing Notes Reviewed/ Care Coordinated, and agree without changes. Applicable Imaging Reviewed.  Interpretation of Laboratory Data incorporated into ED treatment  Plan- suspect discharge after second dose of  IV fluid bolus. Will change Zofran from 4-8 mg per dose. PCP follow-up in 2 days.    Mancel Bale, MD 01/23/15 [phone]

## 2015-01-23 NOTE — ED Notes (Signed)
Phelbotomy at bedside. Pt made aware of urine sample.

## 2015-01-23 NOTE — ED Notes (Signed)
Gave report to Cindy, RN.

## 2015-01-23 NOTE — ED Notes (Signed)
Bed: WA02 Expected date:  Expected time:  Means of arrival:  Comments: tr3

## 2015-01-23 NOTE — Discharge Instructions (Signed)
Encourage oral intake of both food and fluids. Change to the new dose of ondansetron. See your doctor for checkup in one or 2 days.   Nausea and Vomiting Nausea means you feel sick to your stomach. Throwing up (vomiting) is a reflex where stomach contents come out of your mouth. HOME CARE   Take medicine as told by your doctor.  Do not force yourself to eat. However, you do need to drink fluids.  If you feel like eating, eat a normal diet as told by your doctor.  Eat rice, wheat, potatoes, bread, lean meats, yogurt, fruits, and vegetables.  Avoid high-fat foods.  Drink enough fluids to keep your pee (urine) clear or pale yellow.  Ask your doctor how to replace body fluid losses (rehydrate). Signs of body fluid loss (dehydration) include:  Feeling very thirsty.  Dry lips and mouth.  Feeling dizzy.  Dark pee.  Peeing less than normal.  Feeling confused.  Fast breathing or heart rate. GET HELP RIGHT AWAY IF:   You have blood in your throw up.  You have black or bloody poop (stool).  You have a bad headache or stiff neck.  You feel confused.  You have bad belly (abdominal) pain.  You have chest pain or trouble breathing.  You do not pee at least once every 8 hours.  You have cold, clammy skin.  You keep throwing up after 24 to 48 hours.  You have a fever. MAKE SURE YOU:   Understand these instructions.  Will watch your condition.  Will get help right away if you are not doing well or get worse.   This information is not intended to replace advice given to you by your health care provider. Make sure you discuss any questions you have with your health care provider.   Document Released: 08/02/2007 Document Revised: 05/08/2011 Document Reviewed: 07/15/2010 Elsevier Interactive Patient Education [employer]

## 2015-01-23 NOTE — ED Notes (Signed)
Bed: WA02 Expected date:  Expected time:  Means of arrival:  Comments: Tr 3 

## 2015-01-24 ENCOUNTER — Observation Stay (HOSPITAL_COMMUNITY): Payer: Medicare HMO

## 2015-01-24 DIAGNOSIS — E038 Other specified hypothyroidism: Secondary | ICD-10-CM | POA: Diagnosis not present

## 2015-01-24 DIAGNOSIS — R627 Adult failure to thrive: Secondary | ICD-10-CM | POA: Diagnosis not present

## 2015-01-24 DIAGNOSIS — I10 Essential (primary) hypertension: Secondary | ICD-10-CM

## 2015-01-24 LAB — TSH: TSH: 2.54 u[IU]/mL (ref 0.350–4.500)

## 2015-01-24 LAB — BASIC METABOLIC PANEL
Anion gap: 6 (ref 5–15)
BUN: 8 mg/dL (ref 6–20)
CHLORIDE: 103 mmol/L (ref 101–111)
CO2: 23 mmol/L (ref 22–32)
CREATININE: 0.58 mg/dL (ref 0.44–1.00)
Calcium: 6.8 mg/dL — ABNORMAL LOW (ref 8.9–10.3)
GFR calc Af Amer: >60 mL/min (ref >60)
GFR calc non Af Amer: >60 mL/min (ref >60)
GLUCOSE: 109 mg/dL — AB (ref 65–99)
POTASSIUM: 3 mmol/L — AB (ref 3.5–5.1)
SODIUM: 132 mmol/L — AB (ref 135–145)

## 2015-01-24 MED ORDER — POTASSIUM CHLORIDE CRYS ER 20 MEQ PO TBCR
40.0000 meq | EXTENDED_RELEASE_TABLET | Freq: Once | ORAL | Status: AC
  Administered 2015-01-24: 40 meq via ORAL
  Filled 2015-01-24: qty 2

## 2015-01-24 NOTE — Evaluation (Signed)
Physical Therapy Evaluation Patient Details Name: Keianna Signer MRN: [id number] DOB: [birth year] Today's Date: 01/24/2015   History of Present Illness  81 yo female presented to ED with nausea on 01/23/15.  Clinical Impression  Patient is at baseline and is ready for DC.    Follow Up Recommendations No PT follow up    Equipment Recommendations  None recommended by PT    Recommendations for Other Services       Precautions / Restrictions Precautions Precautions: Fall      Mobility  Bed Mobility               General bed mobility comments: in chair  Transfers Overall transfer level: Independent                  Ambulation/Gait Ambulation/Gait assistance: Independent Ambulation Distance (Feet): 300 Feet Assistive device: None Gait Pattern/deviations: WFL(Within Functional Limits)   Gait velocity interpretation: at or above normal speed for age/gender    Stairs Stairs: Yes Stairs assistance: Modified independent (Device/Increase time) Stair Management: One rail Right Number of Stairs: 3    Wheelchair Mobility    Modified Rankin (Stroke Patients Only)       Balance Overall balance assessment: Needs assistance   Sitting balance-Leahy Scale: Normal       Standing balance-Leahy Scale: Good Standing balance comment: reviewed to sit down to  don/doff lower body clothes.                             Pertinent Vitals/Pain Pain Assessment: No/denies pain    Home Living Family/patient expects to be discharged to:: Private residence Living Arrangements: Children Available Help at Discharge: Family Type of Home: House Home Access: Stairs to enter Entrance Stairs-Rails: Right Entrance Stairs-Number of Steps: 3 Home Layout: One level Home Equipment: Environmental consultant - 2 wheels      Prior Function Level of Independence: Independent               Hand Dominance        Extremity/Trunk Assessment   Upper Extremity Assessment:  Overall WFL for tasks assessed           Lower Extremity Assessment: Overall WFL for tasks assessed      Cervical / Trunk Assessment: Kyphotic  Communication   Communication: No difficulties  Cognition Arousal/Alertness: Awake/alert Behavior During Therapy: WFL for tasks assessed/performed Overall Cognitive Status: Within Functional Limits for tasks assessed                      General Comments      Exercises        Assessment/Plan    PT Assessment Patent does not need any further PT services  PT Diagnosis Generalized weakness   PT Problem List    PT Treatment Interventions     PT Goals (Current goals can be found in the Care Plan section) Acute Rehab PT Goals PT Goal Formulation: All assessment and education complete, DC therapy    Frequency     Barriers to discharge        Co-evaluation               End of Session   Activity Tolerance: Patient tolerated treatment well Patient left: in chair;with call bell/phone within reach Nurse Communication: Mobility status    Functional Assessment Tool Used: clinical judgement Functional Limitation: Mobility: Walking and moving around Mobility: Walking and Moving Around Current Status (N8295):  0 percent impaired, limited or restricted Mobility: Walking and Moving Around Goal Status [phone](G8979): 0 percent impaired, limited or restricted Mobility: Walking and Moving Around Discharge Status [phone](G8980): 0 percent impaired, limited or restricted    Time: 1150-1158 PT Time Calculation (min) (ACUTE ONLY): 8 min   Charges:         PT G Codes:   PT G-Codes **NOT FOR INPATIENT CLASS** Functional Assessment Tool Used: clinical judgement Functional Limitation: Mobility: Walking and moving around Mobility: Walking and Moving Around Current Status ([id number](G8978): 0 percent impaired, limited or restricted Mobility: Walking and Moving Around Goal Status ([id number](G8979): 0 percent impaired, limited or restricted Mobility: Walking  and Moving Around Discharge Status [phone](G8980): 0 percent impaired, limited or restricted    Rada HayHill, Nomi Rudnicki Elizabeth 01/24/2015, 12:05 PM

## 2015-01-24 NOTE — Discharge Summary (Signed)
Physician Discharge Summary  Pamela Peterson QIO:[id number] DOB: [birth year] DOA: 01/23/2015  PCP: Pamela Brazil, DO  Admit date: 01/23/2015 Discharge date: 01/24/2015  Recommendations for Outpatient Follow-up:  1. Pt will need to follow up with PCP in 1-2 weeks post discharge 2. Please obtain BMP to evaluate electrolytes and kidney function, K level   Discharge Diagnoses:  Principal Problem:   Failure to thrive in adult Active Problems:   Hypothyroidism   Diarrhea   Nausea without vomiting   Essential hypertension   GERD (gastroesophageal reflux disease)  Discharge Condition: Stable  Diet recommendation: Regular diet as tolerated   History of present illness:  81 year old female with osteoporosis, hypothyroidism, hypertension, and GERD; who presented with nausea and poor oral intake.   Hospital Course:  Principal Problem:   Failure to thrive in adult - pt eating well, tolerating diet well, wants to go home - PT saw pt, Ok to go home  - family in agreement   Active Problems:   Hypothyroidism - continue synthroid     Nausea without vomiting - resolved per pt - tolerating diet well     Hypokalemia - supplemented prior to discharge     Hyponatremia - pre renal in etiology - IVF provided and pt eating well     Essential hypertension - reasonable inpatient control  - continue with home medical regimen     GERD (gastroesophageal reflux disease) - continue PPI per home medical regimen   Procedures/Studies: X-ray Chest Pa And Lateral 01/24/2015 Hyperinflated lungs with chronic bronchitic markings. 2. Several nodular densities in the lateral RIGHT upper lobe and RIGHT lower lobe may represent chronic granulomatous disease however no comparison available. Consider CT of the thorax for further evaluation. 3. Chronic compression deformities of the thoracic spine.  Discharge Exam: Filed Vitals:   01/23/15 2200 01/24/15 0600  BP: 136/71 126/55  Pulse: 85 74  Temp:  98 F (36.7 C) 97.4 F (36.3 C)  Resp: 18 16   Filed Vitals:   01/23/15 1430 01/23/15 2003 01/23/15 2200 01/24/15 0600  BP: 155/71 143/72 136/71 126/55  Pulse: 80 82 85 74  Temp:  98.7 F (37.1 C) 98 F (36.7 C) 97.4 F (36.3 C)  TempSrc:  Oral Oral Oral  Resp:  Height:    (1.372 m)   Weight:   43.1 kg (95 lb 0.3 oz)   SpO2: 98% 100% 95% 95%    General: Pt is alert, follows commands appropriately, not in acute distress Cardiovascular: Regular rate and rhythm, no rubs, no gallops Respiratory: Clear to auscultation bilaterally, no wheezing, no crackles, no rhonchi Abdominal: Soft, non tender, non distended, bowel sounds +, no guarding Extremities: no edema, no cyanosis, pulses palpable bilaterally DP and PT Neuro: Grossly nonfocal  Discharge Instructions  Discharge Instructions    Diet - low sodium heart healthy    Complete by:  As directed      Increase activity slowly    Complete by:  As directed             Medication List    STOP taking these medications        ondansetron 4 MG disintegrating tablet  Commonly known as:  ZOFRAN ODT      TAKE these medications        amLODipine 10 MG tablet  Commonly known as:  NORVASC  Take 10 mg by mouth at bedtime.     aspirin EC 81 MG tablet  Take 81 mg  by mouth daily.     CALCIUM-VITAMIN D PO  Take 1 tablet by mouth 2 (two) times daily. Calcium 600- D3- 2000     CORICIDIN HBP COLD/FLU PO  Take 1-2 tablets by mouth daily as needed (sinuese/congestion). For congestion     esomeprazole 40 MG capsule  Commonly known as:  NEXIUM  Take 40 mg by mouth daily at 12 noon.     levothyroxine 50 MCG tablet  Commonly known as:  SYNTHROID, LEVOTHROID  Take 50 mcg by mouth daily before breakfast.     loperamide 2 MG capsule  Commonly known as:  IMODIUM  Take 1 capsule (2 mg total) by mouth every 8 (eight) hours as needed for diarrhea or loose stools.     losartan 100 MG tablet  Commonly known as:  COZAAR   Take 100 mg by mouth daily.     mirabegron ER 25 MG Tb24 tablet  Commonly known as:  MYRBETRIQ  Take 25 mg by mouth daily.     MULTIVITAMIN & MINERAL PO  Take 1 tablet by mouth daily.     ondansetron 8 MG tablet  Commonly known as:  ZOFRAN  Take 1 tablet (8 mg total) by mouth every 8 (eight) hours as needed for nausea or vomiting.     pregabalin 100 MG capsule  Commonly known as:  LYRICA  Take 200 mg by mouth 2 (two) times daily.     TYLENOL PO  Take 2 tablets by mouth every 6 (six) hours as needed (pain).     Vitamin D3 2000 UNITS Tabs  Take 1 tablet by mouth daily.           Follow-up Information    Follow up with Hope PigeonEverly, Rebecca B, DO. Schedule an appointment as soon as possible for a visit in 2 days.   Specialty:  Internal Medicine   Contact information:   [address] [place] [address] [phone]       Call Debbora PrestoMAGICK-Sharell Hilmer, MD.   Specialty:  Internal Medicine   Why:  As needed   Contact information:   [address] [place] [address] [phone]        The results of significant diagnostics from this hospitalization (including imaging, microbiology, ancillary and laboratory) are listed below for reference.     Microbiology: No results found for this or any previous visit (from the past 240 hour(s)).   Labs: Basic Metabolic Panel:  Recent Labs Lab 01/23/15 1042 01/24/15 0434  NA 135 132*  K 3.7 3.0*  CL 100* 103  CO2 26 23  GLUCOSE 116* 109*  BUN 14 8  CREATININE 0.67 0.58  CALCIUM 8.6* 6.8*   Liver Function Tests:  Recent Labs Lab 01/23/15 1042  AST 31  ALT 15  ALKPHOS 48  BILITOT 0.4  PROT 6.4*  ALBUMIN 3.9    Recent Labs Lab 01/23/15 1042  LIPASE 36   CBC:  Recent Labs Lab 01/23/15 1042  WBC 5.2  HGB 12.6  HCT 36.6  MCV 93.8  PLT 194   Cardiac Enzymes:  Recent Labs Lab 01/23/15 2230  TROPONINI <0.03   SIGNED: Time coordinating discharge: Over 30  minutes  Debbora PrestoMAGICK-Cree Kunert, MD  [facility] 01/24/2015, 12:41 PM Pager [phone]  If 7PM-7AM, please contact night-coverage [email or web address] Password [email or web address]

## 2015-01-24 NOTE — Progress Notes (Signed)
Assessment unchanged. Pt and daughter verbalized understanding for dc instructions through teach back. No scripts at dc. Discharged via wc to front entrance to meet awaiting vehicle to carry home. Accompanied by daughter and NT.

## 2015-02-28 ENCOUNTER — Observation Stay (HOSPITAL_COMMUNITY)
Admission: EM | Admit: 2015-02-28 | Discharge: 2015-03-02 | Disposition: A | Discharge status: Home / Self Care | Payer: Medicare HMO | Attending: Internal Medicine | Admitting: Internal Medicine

## 2015-02-28 ENCOUNTER — Encounter (HOSPITAL_COMMUNITY): Payer: Self-pay

## 2015-02-28 ENCOUNTER — Emergency Department (HOSPITAL_COMMUNITY)
Admission: EM | Admit: 2015-02-28 | Discharge: 2015-02-28 | Disposition: A | Discharge status: Home / Self Care | Payer: Medicare HMO | Source: Home / Self Care | Attending: Emergency Medicine | Admitting: Emergency Medicine

## 2015-02-28 ENCOUNTER — Encounter (HOSPITAL_COMMUNITY): Payer: Self-pay | Admitting: Family Medicine

## 2015-02-28 DIAGNOSIS — E039 Hypothyroidism, unspecified: Secondary | ICD-10-CM | POA: Insufficient documentation

## 2015-02-28 DIAGNOSIS — K219 Gastro-esophageal reflux disease without esophagitis: Secondary | ICD-10-CM | POA: Diagnosis present

## 2015-02-28 DIAGNOSIS — Z880 Allergy status to penicillin: Secondary | ICD-10-CM | POA: Insufficient documentation

## 2015-02-28 DIAGNOSIS — M810 Age-related osteoporosis without current pathological fracture: Secondary | ICD-10-CM | POA: Insufficient documentation

## 2015-02-28 DIAGNOSIS — K224 Dyskinesia of esophagus: Secondary | ICD-10-CM | POA: Diagnosis not present

## 2015-02-28 DIAGNOSIS — E785 Hyperlipidemia, unspecified: Secondary | ICD-10-CM | POA: Diagnosis not present

## 2015-02-28 DIAGNOSIS — G2581 Restless legs syndrome: Secondary | ICD-10-CM | POA: Diagnosis not present

## 2015-02-28 DIAGNOSIS — I447 Left bundle-branch block, unspecified: Secondary | ICD-10-CM | POA: Insufficient documentation

## 2015-02-28 DIAGNOSIS — R627 Adult failure to thrive: Secondary | ICD-10-CM | POA: Insufficient documentation

## 2015-02-28 DIAGNOSIS — I10 Essential (primary) hypertension: Secondary | ICD-10-CM | POA: Insufficient documentation

## 2015-02-28 DIAGNOSIS — E876 Hypokalemia: Secondary | ICD-10-CM | POA: Diagnosis present

## 2015-02-28 DIAGNOSIS — E871 Hypo-osmolality and hyponatremia: Secondary | ICD-10-CM | POA: Insufficient documentation

## 2015-02-28 DIAGNOSIS — Z7982 Long term (current) use of aspirin: Secondary | ICD-10-CM

## 2015-02-28 DIAGNOSIS — F419 Anxiety disorder, unspecified: Secondary | ICD-10-CM | POA: Diagnosis not present

## 2015-02-28 DIAGNOSIS — R110 Nausea: Secondary | ICD-10-CM | POA: Diagnosis present

## 2015-02-28 DIAGNOSIS — Z79899 Other long term (current) drug therapy: Secondary | ICD-10-CM

## 2015-02-28 DIAGNOSIS — R9431 Abnormal electrocardiogram [ECG] [EKG]: Secondary | ICD-10-CM | POA: Insufficient documentation

## 2015-02-28 DIAGNOSIS — R1310 Dysphagia, unspecified: Secondary | ICD-10-CM | POA: Insufficient documentation

## 2015-02-28 LAB — COMPREHENSIVE METABOLIC PANEL
ALBUMIN: 4.1 g/dL (ref 3.5–5.0)
ALBUMIN: 3.9 g/dL (ref 3.5–5.0)
ALK PHOS: 48 U/L (ref 38–126)
ALT: 16 U/L (ref 14–54)
ALT: 15 U/L (ref 14–54)
ANION GAP: 9 (ref 5–15)
AST: 34 U/L (ref 15–41)
AST: 37 U/L (ref 15–41)
Alkaline Phosphatase: 45 U/L (ref 38–126)
Anion gap: 9 (ref 5–15)
BILIRUBIN TOTAL: 0.8 mg/dL (ref 0.3–1.2)
BUN: 12 mg/dL (ref 6–20)
BUN: 9 mg/dL (ref 6–20)
CALCIUM: 8.5 mg/dL — AB (ref 8.9–10.3)
CHLORIDE: 104 mmol/L (ref 101–111)
CO2: 26 mmol/L (ref 22–32)
CO2: 22 mmol/L (ref 22–32)
CREATININE: 0.58 mg/dL (ref 0.44–1.00)
Calcium: 8.2 mg/dL — ABNORMAL LOW (ref 8.9–10.3)
Chloride: 93 mmol/L — ABNORMAL LOW (ref 101–111)
Creatinine, Ser: 0.51 mg/dL (ref 0.44–1.00)
GFR calc Af Amer: >60 mL/min (ref >60)
GFR calc non Af Amer: >60 mL/min (ref >60)
GLUCOSE: 127 mg/dL — AB (ref 65–99)
GLUCOSE: 133 mg/dL — AB (ref 65–99)
Potassium: 3.7 mmol/L (ref 3.5–5.1)
Potassium: 3.3 mmol/L — ABNORMAL LOW (ref 3.5–5.1)
Sodium: 128 mmol/L — ABNORMAL LOW (ref 135–145)
Sodium: 135 mmol/L (ref 135–145)
TOTAL PROTEIN: 6.3 g/dL — AB (ref 6.5–8.1)
Total Bilirubin: 0.8 mg/dL (ref 0.3–1.2)
Total Protein: 6.2 g/dL — ABNORMAL LOW (ref 6.5–8.1)

## 2015-02-28 LAB — CBC WITH DIFFERENTIAL/PLATELET
BASOS ABS: 0 10*3/uL (ref 0.0–0.1)
Basophils Absolute: 0 10*3/uL (ref 0.0–0.1)
Basophils Relative: 0 %
Basophils Relative: 0 %
EOS PCT: 0 %
Eosinophils Absolute: 0 10*3/uL (ref 0.0–0.7)
Eosinophils Absolute: 0 10*3/uL (ref 0.0–0.7)
Eosinophils Relative: 0 %
HCT: 34.5 % — ABNORMAL LOW (ref 36.0–46.0)
HEMATOCRIT: 34.7 % — AB (ref 36.0–46.0)
HEMOGLOBIN: 11.8 g/dL — AB (ref 12.0–15.0)
Hemoglobin: 12 g/dL (ref 12.0–15.0)
LYMPHS ABS: 0.5 10*3/uL — AB (ref 0.7–4.0)
LYMPHS ABS: 0.7 10*3/uL (ref 0.7–4.0)
LYMPHS PCT: 9 %
LYMPHS PCT: 10 %
MCH: 31.3 pg (ref 26.0–34.0)
MCH: 32.5 pg (ref 26.0–34.0)
MCHC: 34.2 g/dL (ref 30.0–36.0)
MCHC: 34.6 g/dL (ref 30.0–36.0)
MCV: 91.5 fL (ref 78.0–100.0)
MCV: 94 fL (ref 78.0–100.0)
MONOS PCT: 6 %
Monocytes Absolute: 0.4 10*3/uL (ref 0.1–1.0)
Monocytes Absolute: 0.6 10*3/uL (ref 0.1–1.0)
Monocytes Relative: 8 %
NEUTROS ABS: 4.9 10*3/uL (ref 1.7–7.7)
Neutro Abs: 6 10*3/uL (ref 1.7–7.7)
Neutrophils Relative %: 85 %
Neutrophils Relative %: 82 %
PLATELETS: 233 10*3/uL (ref 150–400)
Platelets: 219 10*3/uL (ref 150–400)
RBC: 3.77 MIL/uL — AB (ref 3.87–5.11)
RBC: 3.69 MIL/uL — AB (ref 3.87–5.11)
RDW: 13.1 % (ref 11.5–15.5)
RDW: 13.2 % (ref 11.5–15.5)
WBC: 5.8 10*3/uL (ref 4.0–10.5)
WBC: 7.3 10*3/uL (ref 4.0–10.5)

## 2015-02-28 LAB — URINALYSIS, ROUTINE W REFLEX MICROSCOPIC
BILIRUBIN URINE: NEGATIVE
GLUCOSE, UA: NEGATIVE mg/dL
HGB URINE DIPSTICK: NEGATIVE
Ketones, ur: NEGATIVE mg/dL
Leukocytes, UA: NEGATIVE
Nitrite: NEGATIVE
Protein, ur: NEGATIVE mg/dL
SPECIFIC GRAVITY, URINE: 1.005 (ref 1.005–1.030)
pH: 7.5 (ref 5.0–8.0)

## 2015-02-28 MED ORDER — ONDANSETRON HCL 4 MG/2ML IJ SOLN
4.0000 mg | Freq: Once | INTRAMUSCULAR | Status: AC
  Administered 2015-02-28: 4 mg via INTRAVENOUS
  Filled 2015-02-28: qty 2

## 2015-02-28 MED ORDER — ONDANSETRON HCL 8 MG PO TABS: 8.0000 mg | ORAL_TABLET | Freq: Three times a day (TID) | ORAL | Status: DC | PRN

## 2015-02-28 MED ORDER — ONDANSETRON 4 MG PO TBDP
4.0000 mg | ORAL_TABLET | Freq: Once | ORAL | Status: AC | PRN
  Administered 2015-02-28: 4 mg via ORAL
  Filled 2015-02-28: qty 1

## 2015-02-28 MED ORDER — LORAZEPAM 2 MG/ML IJ SOLN
1.0000 mg | Freq: Once | INTRAMUSCULAR | Status: AC
  Administered 2015-02-28: 1 mg via INTRAVENOUS
  Filled 2015-02-28: qty 1

## 2015-02-28 MED ORDER — SODIUM CHLORIDE 0.9 % IV BOLUS (SEPSIS)
500.0000 mL | Freq: Once | INTRAVENOUS | Status: AC
  Administered 2015-02-28: 500 mL via INTRAVENOUS

## 2015-02-28 MED ORDER — SODIUM CHLORIDE 0.9 % IV BOLUS (SEPSIS)
1000.0000 mL | Freq: Once | INTRAVENOUS | Status: AC
  Administered 2015-02-28: 1000 mL via INTRAVENOUS

## 2015-02-28 MED ORDER — PANTOPRAZOLE SODIUM 40 MG IV SOLR
40.0000 mg | Freq: Once | INTRAVENOUS | Status: AC
  Administered 2015-02-28: 40 mg via INTRAVENOUS
  Filled 2015-02-28: qty 40

## 2015-02-28 NOTE — ED Notes (Signed)
MD at bedside. 

## 2015-02-28 NOTE — ED Provider Notes (Signed)
CSN: [id number]     Arrival date & time 02/28/15  [id number] History   First MD Initiated Contact with Patient 02/28/15 [phone]     Chief Complaint  Patient presents with  . Nausea     (Consider location/radiation/quality/duration/timing/severity/associated sxs/prior Treatment) HPI Comments: Patient presents with nausea. She is a 82 year old female with a history of hypertension, hyperlipidemia, gastroesophageal reflux disease, and recurrent bouts of nausea. She states she started feeling nauseated last night and took a ODT Zofran without relief. She denies any vomiting. There is no abdominal pain. No fevers. No urinary symptoms. She's had similar symptoms in the past. She normally responds to IV antibiotics and fluids. The last time she was here, she had been out of her Nexium for a couple days. On that visit, she was admitted overnight for IV fluids and antiemetics. She was discharged in next day. She's previously seen a gastroenterologist and had an endoscopy. She states she's been taking her Nexium regularly   Past Medical History  Diagnosis Date  . Osteoporosis   . Hypothyroid   . Hypertension   . Hyperlipidemia    Past Surgical History  Procedure Laterality Date  . Bladder surgery     No family history on file. Social History  Substance Use Topics  . Smoking status: Never Smoker   . Smokeless tobacco: None  . Alcohol Use: No   OB History    No data available     Review of Systems  Constitutional: Negative for fever, chills, diaphoresis and fatigue.  HENT: Negative for congestion, rhinorrhea and sneezing.   Eyes: Negative.   Respiratory: Negative for cough, chest tightness and shortness of breath.   Cardiovascular: Negative for chest pain and leg swelling.  Gastrointestinal: Positive for nausea. Negative for vomiting, abdominal pain, diarrhea and blood in stool.  Genitourinary: Negative for frequency, hematuria, flank pain and difficulty urinating.  Musculoskeletal: Negative for  back pain and arthralgias.  Skin: Negative for rash.  Neurological: Negative for dizziness, speech difficulty, weakness, numbness and headaches.      Allergies  Penicillins  Home Medications   Prior to Admission medications   Medication Sig Start Date End Date Taking? Authorizing Provider  acetaminophen (TYLENOL) 500 MG tablet Take 1,000 mg by mouth every 6 (six) hours as needed for moderate pain.   Yes Historical Provider, MD  amLODipine (NORVASC) 10 MG tablet Take 10 mg by mouth at bedtime.   Yes Historical Provider, MD  aspirin EC 81 MG tablet Take 81 mg by mouth daily.   Yes Historical Provider, MD  bismuth subsalicylate (PEPTO BISMOL) 262 MG chewable tablet Chew 524 mg by mouth daily as needed for indigestion.   Yes Historical Provider, MD  CALCIUM-VITAMIN D PO Take 1 tablet by mouth 2 (two) times daily. Calcium 600- D3- 2000   Yes Historical Provider, MD  Chlorpheniramine-Acetaminophen (CORICIDIN HBP COLD/FLU PO) Take 1-2 tablets by mouth daily as needed (sinuese/congestion). For congestion   Yes Historical Provider, MD  Cholecalciferol (VITAMIN D3) 2000 UNITS TABS Take 1 tablet by mouth daily.   Yes Historical Provider, MD  esomeprazole (NEXIUM) 40 MG capsule Take 40 mg by mouth daily at 12 noon.   Yes Historical Provider, MD  levothyroxine (SYNTHROID, LEVOTHROID) 50 MCG tablet Take 50 mcg by mouth daily before breakfast.   Yes Historical Provider, MD  loperamide (IMODIUM) 2 MG capsule Take 1 capsule (2 mg total) by mouth every 8 (eight) hours as needed for diarrhea or loose stools. 04/27/14  Yes Layla MawKristen N Ward,  DO  losartan (COZAAR) 100 MG tablet Take 100 mg by mouth daily.   Yes Historical Provider, MD  mirabegron ER (MYRBETRIQ) 25 MG TB24 tablet Take 25 mg by mouth daily.    Yes Historical Provider, MD  Multiple Vitamins-Minerals (MULTIVITAMIN & MINERAL PO) Take 1 tablet by mouth daily.   Yes Historical Provider, MD  pregabalin (LYRICA) 100 MG capsule Take 200 mg by mouth 2 (two)  times daily.   Yes Historical Provider, MD  ondansetron (ZOFRAN) 8 MG tablet Take 1 tablet (8 mg total) by mouth every 8 (eight) hours as needed for nausea or vomiting. 02/28/15   Rolan Bucco, MD   BP 164/80 mmHg  Pulse 93  Temp(Src) 98.3 F (36.8 C) (Oral)  Resp 18  SpO2 94% Physical Exam  Constitutional: She is oriented to person, place, and time. She appears well-developed and well-nourished.  HENT:  Head: Normocephalic and atraumatic.  Eyes: Pupils are equal, round, and reactive to light.  Neck: Normal range of motion. Neck supple.  Cardiovascular: Normal rate, regular rhythm and normal heart sounds.   Pulmonary/Chest: Effort normal and breath sounds normal. No respiratory distress. She has no wheezes. She has no rales. She exhibits no tenderness.  Abdominal: Soft. Bowel sounds are normal. There is no tenderness. There is no rebound and no guarding.  Musculoskeletal: Normal range of motion. She exhibits no edema.  Lymphadenopathy:    She has no cervical adenopathy.  Neurological: She is alert and oriented to person, place, and time.  Skin: Skin is warm and dry. No rash noted.  Psychiatric: She has a normal mood and affect.    ED Course  Procedures (including critical care time) Labs Review Labs Reviewed  COMPREHENSIVE METABOLIC PANEL - Abnormal; Notable for the following:    Sodium 128 (*)    Chloride 93 (*)    Glucose, Bld 127 (*)    Calcium 8.5 (*)    Total Protein 6.3 (*)    All other components within normal limits  CBC WITH DIFFERENTIAL/PLATELET - Abnormal; Notable for the following:    RBC 3.77 (*)    Hemoglobin 11.8 (*)    HCT 34.5 (*)    Lymphs Abs 0.5 (*)    All other components within normal limits  URINALYSIS, ROUTINE W REFLEX MICROSCOPIC (NOT AT [facility])    Imaging Review No results found. I have personally reviewed and evaluated these images and lab results as part of my medical decision-making.   EKG Interpretation None      MDM   Final  diagnoses:  Nausea  Hyponatremia    Patient presents with nausea. This is her chronic recurring complaint the patient. There is no associated vomiting or abdominal pain. Her labs show hyponatremia which also appears chronic. This is slightly lower than her prior values. I discussed this with the patient and the patient's daughter and she will have this rechecked by her PCP. Her hemoglobin is similar to her baseline values. She's feeling much better after Zofran and IV fluids. She was discharged home in good condition. She was encouraged to follow-up with her PCP.    Rolan Bucco, MD 02/28/15 1302

## 2015-02-28 NOTE — ED Notes (Signed)
Patient reports experiencing feeling nausea. [facility] Zofran was prescribed but not picked up from pharmacy. Pt's daughter reports they already had Zofran at home, [facility] 16:00 this afternoon.

## 2015-02-28 NOTE — Discharge Instructions (Signed)
Hyponatremia Hyponatremia is when the amount of salt (sodium) in your blood is too low. When sodium levels are low, your cells absorb extra water and they swell. The swelling happens throughout the body, but it mostly affects the brain. CAUSES This condition may be caused by:  Heart, kidney, or liver problems.  Thyroid problems.  Adrenal gland problems.  Metabolic conditions, such as syndrome of inappropriate antidiuretic hormone (SIADH).  Severe vomiting and diarrhea.  Certain medicines or illegal drugs.  Dehydration.  Drinking too much water.  Eating a diet that is low in sodium.  Large burns on your body.  Sweating. RISK FACTORS This condition is more likely to develop in people who:  Have long-term (chronic) kidney disease.  Have heart failure.  Have a medical condition that causes frequent or excessive diarrhea.  Have metabolic conditions, such as Addison disease or SIADH.  Take certain medicines that affect the sodium and fluid balance in the blood. Some of these medicine types include:  Diuretics.  NSAIDs.  Some opioid pain medicines.  Some antidepressants.  Some seizure prevention medicines. SYMPTOMS  Symptoms of this condition include:  Nausea and vomiting.  Confusion.  Lethargy.  Agitation.  Headache.  Seizures.  Unconsciousness.  Appetite loss.  Muscle weakness and cramping.  Feeling weak or light-headed.  Having a rapid heart rate.  Fainting, in severe cases. DIAGNOSIS This condition is diagnosed with a medical history and physical exam. You will also have other tests, including:  Blood tests.  Urine tests. TREATMENT Treatment for this condition depends on the cause. Treatment may include:  Fluids given through an IV tube that is inserted into one of your veins.  Medicines to correct the sodium imbalance. If medicines are causing the condition, the medicines will need to be adjusted.  Limiting water or fluid intake to  get the correct sodium balance. HOME CARE INSTRUCTIONS  Take medicines only as directed by your health care provider. Many medicines can make this condition worse. Talk with your health care provider about any medicines that you are currently taking.  Carefully follow a recommended diet as directed by your health care provider.  Carefully follow instructions from your health care provider about fluid restrictions.  Keep all follow-up visits as directed by your health care provider. This is important.  Do not drink alcohol. SEEK MEDICAL CARE IF:  You develop worsening nausea, fatigue, headache, confusion, or weakness.  Your symptoms go away and then return.  You have problems following the recommended diet. SEEK IMMEDIATE MEDICAL CARE IF:  You have a seizure.  You faint.  You have ongoing diarrhea or vomiting.   This information is not intended to replace advice given to you by your health care provider. Make sure you discuss any questions you have with your health care provider.   Document Released: 02/03/2002 Document Revised: 06/30/2014 Document Reviewed: 03/05/2014 Elsevier Interactive Patient Education 2016 [employer] Inc.  Nausea and Vomiting Nausea is a sick feeling that often comes before throwing up (vomiting). Vomiting is a reflex where stomach contents come out of your mouth. Vomiting can cause severe loss of body fluids (dehydration). Children and elderly adults can become dehydrated quickly, especially if they also have diarrhea. Nausea and vomiting are symptoms of a condition or disease. It is important to find the cause of your symptoms. CAUSES   Direct irritation of the stomach lining. This irritation can result from increased acid production (gastroesophageal reflux disease), infection, food poisoning, taking certain medicines (such as nonsteroidal anti-inflammatory drugs), alcohol  use, or tobacco use.  Signals from the brain.These signals could be caused by a  headache, heat exposure, an inner ear disturbance, increased pressure in the brain from injury, infection, a tumor, or a concussion, pain, emotional stimulus, or metabolic problems.  An obstruction in the gastrointestinal tract (bowel obstruction).  Illnesses such as diabetes, hepatitis, gallbladder problems, appendicitis, kidney problems, cancer, sepsis, atypical symptoms of a heart attack, or eating disorders.  Medical treatments such as chemotherapy and radiation.  Receiving medicine that makes you sleep (general anesthetic) during surgery. DIAGNOSIS Your caregiver may ask for tests to be done if the problems do not improve after a few days. Tests may also be done if symptoms are severe or if the reason for the nausea and vomiting is not clear. Tests may include:  Urine tests.  Blood tests.  Stool tests.  Cultures (to look for evidence of infection).  X-rays or other imaging studies. Test results can help your caregiver make decisions about treatment or the need for additional tests. TREATMENT You need to stay well hydrated. Drink frequently but in small amounts.You may wish to drink water, sports drinks, clear broth, or eat frozen ice pops or gelatin dessert to help stay hydrated.When you eat, eating slowly may help prevent nausea.There are also some antinausea medicines that may help prevent nausea. HOME CARE INSTRUCTIONS   Take all medicine as directed by your caregiver.  If you do not have an appetite, do not force yourself to eat. However, you must continue to drink fluids.  If you have an appetite, eat a normal diet unless your caregiver tells you differently.  Eat a variety of complex carbohydrates (rice, wheat, potatoes, bread), lean meats, yogurt, fruits, and vegetables.  Avoid high-fat foods because they are more difficult to digest.  Drink enough water and fluids to keep your urine clear or pale yellow.  If you are dehydrated, ask your caregiver for specific  rehydration instructions. Signs of dehydration may include:  Severe thirst.  Dry lips and mouth.  Dizziness.  Dark urine.  Decreasing urine frequency and amount.  Confusion.  Rapid breathing or pulse. SEEK IMMEDIATE MEDICAL CARE IF:   You have blood or brown flecks (like coffee grounds) in your vomit.  You have black or bloody stools.  You have a severe headache or stiff neck.  You are confused.  You have severe abdominal pain.  You have chest pain or trouble breathing.  You do not urinate at least once every 8 hours.  You develop cold or clammy skin.  You continue to vomit for longer than 24 to 48 hours.  You have a fever. MAKE SURE YOU:   Understand these instructions.  Will watch your condition.  Will get help right away if you are not doing well or get worse.   This information is not intended to replace advice given to you by your health care provider. Make sure you discuss any questions you have with your health care provider.   Document Released: 02/13/2005 Document Revised: 05/08/2011 Document Reviewed: 07/13/2010 Elsevier Interactive Patient Education [employer] Else[employer] Inc.

## 2015-02-28 NOTE — ED Provider Notes (Signed)
Medical screening examination/treatment/procedure(s) were conducted as a shared visit with non-physician practitioner(s) and myself.  I personally evaluated the patient during the encounter.  82 year old female without much medical history presents to the emergency department today with recurrence of her nausea/vomiting. No abdominal pain, fever, urinary symptoms or other causes at this time. The patient has a history of hyperosmia as a cause for symptoms. On exam patient is actively dry heaving. Vital signs are okay. Abdomen is benign. Not complaining of Chest pain or shortness of breath,  lungs and heart are normal. Will try to give more zofran, fluids and reassess for appropriateness of discharge.    EKG Interpretation   Date/Time:  Sunday February 28 2015 22:49:21 EST Ventricular Rate:  93 PR Interval:  172 QRS Duration: 133 QT Interval:  442 QTC Calculation: 550 R Axis:   -35 Text Interpretation:  Sinus rhythm Left bundle branch block Confirmed by  [facility] MD, Barbara CowerJASON [phone]([id number]) on 02/28/2015 10:53:38 PM        Marily MemosJason Luzelena Heeg, MD 02/28/15 2256

## 2015-02-28 NOTE — ED Notes (Signed)
Pt presents with c/o nausea. Per family, pt has these episodes approx once a month, was admitted last time she experienced these symptoms. Pt denies any vomiting, nausea only. Also reports occasional diarrhea.

## 2015-02-28 NOTE — ED Provider Notes (Signed)
CSN: [id number]     Arrival date & time 02/28/15  1947 History   First MD Initiated Contact with Patient 02/28/15 2102     Chief Complaint  Patient presents with  . Nausea     (Consider location/radiation/quality/duration/timing/severity/associated sxs/prior Treatment) HPI   82 year old female with history of GERD, recurrent nausea, hypertension, hyperlipidemia presenting for evaluation of nausea. Patient was seen earlier today for the same complaint. At that time she reports she felt nauseous the night before, took ODT Zofran without any relief. There was no associated vomiting and no abdominal pain. No fever, dysuria or other concerning symptoms. Her lab shows hyponatremia which appears chronic the remainder of her labs are at her baseline. She received IV fluid and Zofran, she felt better and was discharged home in good condition. She was encouraged to follow-up with her primary care provider. Once patient returned home she begins to fill nauseous and uncomfortable. Daughter states she was rocking in a chair complaining of nausea. She took the Zofran at home and subsequently receive another dose of Zofran when she returns to the ED. At this time she felt nausea has improved but not fully resolved. She has not vomited. Her symptoms is chronic in nature. Currently patient denies having fever, chills, headache, chest pain, shortness of breath, back pain, vomiting, diarrhea, dysuria, lightheadedness or dizziness.  Past Medical History  Diagnosis Date  . Osteoporosis   . Hypothyroid   . Hypertension   . Hyperlipidemia    Past Surgical History  Procedure Laterality Date  . Bladder surgery     History reviewed. No pertinent family history. Social History  Substance Use Topics  . Smoking status: Never Smoker   . Smokeless tobacco: None  . Alcohol Use: No   OB History    No data available     Review of Systems  All other systems reviewed and are negative.     Allergies   Penicillins  Home Medications   Prior to Admission medications   Medication Sig Start Date End Date Taking? Authorizing Provider  acetaminophen (TYLENOL) 500 MG tablet Take 1,000 mg by mouth every 6 (six) hours as needed for moderate pain.    Historical Provider, MD  amLODipine (NORVASC) 10 MG tablet Take 10 mg by mouth at bedtime.    Historical Provider, MD  aspirin EC 81 MG tablet Take 81 mg by mouth daily.    Historical Provider, MD  bismuth subsalicylate (PEPTO BISMOL) 262 MG chewable tablet Chew 524 mg by mouth daily as needed for indigestion.    Historical Provider, MD  CALCIUM-VITAMIN D PO Take 1 tablet by mouth 2 (two) times daily. Calcium 600- D3- 2000    Historical Provider, MD  Chlorpheniramine-Acetaminophen (CORICIDIN HBP COLD/FLU PO) Take 1-2 tablets by mouth daily as needed (sinuese/congestion). For congestion    Historical Provider, MD  Cholecalciferol (VITAMIN D3) 2000 UNITS TABS Take 1 tablet by mouth daily.    Historical Provider, MD  esomeprazole (NEXIUM) 40 MG capsule Take 40 mg by mouth daily at 12 noon.    Historical Provider, MD  levothyroxine (SYNTHROID, LEVOTHROID) 50 MCG tablet Take 50 mcg by mouth daily before breakfast.    Historical Provider, MD  loperamide (IMODIUM) 2 MG capsule Take 1 capsule (2 mg total) by mouth every 8 (eight) hours as needed for diarrhea or loose stools. 04/27/14   Kristen N Ward, DO  losartan (COZAAR) 100 MG tablet Take 100 mg by mouth daily.    Historical Provider, MD  mirabegron ER (  MYRBETRIQ) 25 MG TB24 tablet Take 25 mg by mouth daily.     Historical Provider, MD  Multiple Vitamins-Minerals (MULTIVITAMIN & MINERAL PO) Take 1 tablet by mouth daily.    Historical Provider, MD  ondansetron (ZOFRAN) 8 MG tablet Take 1 tablet (8 mg total) by mouth every 8 (eight) hours as needed for nausea or vomiting. 02/28/15   Rolan Bucco, MD  pregabalin (LYRICA) 100 MG capsule Take 200 mg by mouth 2 (two) times daily.    Historical Provider, MD   BP  145/66 mmHg  Pulse 79  Temp(Src) 97.9 F (36.6 C) (Oral)  Resp 14  Ht 4\' 4"  (1.321 m)  Wt 40.824 kg  BMI 23.39 kg/m2  SpO2 98% Physical Exam  Constitutional: She is oriented to person, place, and time. She appears well-developed and well-nourished. No distress.  Caucasian female appears to be in no acute distress.  HENT:  Head: Atraumatic.  Mouth/Throat: Oropharynx is clear and moist.  Eyes: Conjunctivae are normal.  Neck: Neck supple.  Cardiovascular: Normal rate and regular rhythm.   Pulmonary/Chest: Effort normal and breath sounds normal.  Abdominal: Soft. Bowel sounds are normal. She exhibits no distension. There is no tenderness.  Musculoskeletal: She exhibits no edema.  Neurological: She is alert and oriented to person, place, and time.  Skin: No rash noted.  Psychiatric: She has a normal mood and affect.  Nursing note and vitals reviewed.   ED Course  Procedures (including critical care time) Labs Review Labs Reviewed  CBC WITH DIFFERENTIAL/PLATELET - Abnormal; Notable for the following:    RBC 3.69 (*)    HCT 34.7 (*)    All other components within normal limits  COMPREHENSIVE METABOLIC PANEL - Abnormal; Notable for the following:    Potassium 3.3 (*)    Glucose, Bld 133 (*)    Calcium 8.2 (*)    Total Protein 6.2 (*)    All other components within normal limits  I-STAT TROPOININ, ED     COMPREHENSIVE METABOLIC PANEL - Abnormal; Notable for the following:    Sodium 128 (*)    Chloride 93 (*)    Glucose, Bld 127 (*)    Calcium 8.5 (*)    Total Protein 6.3 (*)    All other components within normal limits  CBC WITH DIFFERENTIAL/PLATELET - Abnormal; Notable for the following:    RBC 3.77 (*)    Hemoglobin 11.8 (*)    HCT 34.5 (*)    Lymphs Abs 0.5 (*)    All other components within normal limits  URINALYSIS, ROUTINE W REFLEX MICROSCOPIC (NOT AT [facility])           Imaging Review No results found. I have  personally reviewed and evaluated these images and lab results as part of my medical decision-making.   EKG Interpretation   Date/Time:  Sunday February 28 2015 22:49:21 EST Ventricular Rate:  93 PR Interval:  172 QRS Duration: 133 QT Interval:  442 QTC Calculation: 550 R Axis:   -35 Text Interpretation:  Sinus rhythm Left bundle branch block Confirmed by  [facility] MD, Barbara Cower [phone]) on 02/28/2015 10:53:38 PM      MDM   Final diagnoses:  Nausea without vomiting    BP 137/67 mmHg  Pulse 94  Temp(Src) 97.9 F (36.6 C) (Oral)  Resp 18  Ht 4\' 4"  (1.321 m)  Wt 40.824 kg  BMI 23.39 kg/m2  SpO2 94%   9:12 PM Patient with history of recurrent nausea returned to the ED today after  being seen earlier today with the same complaint. She has not vomited since discharge. She has no abdominal discomfort. She is afebrile with stable normal vital sign. The daughter requests additional IV fluid and antinausea medication, stating that patient was complaining of nausea incessantly while she was at home.    12:56 AM Patient continues to endorse nausea. She has received several doses of Zofran throughout the day and her EKG demonstrated evidence of a prolonged QT with a QTC of 550. Attempt to provide patient with Ativan to help with the nausea but patient developed paradoxical confusion patient's daughter was concerned, and requesting patient to be admitted.   Appreciate consultation from triad hospitalist, Dr. Robb Matar who agrees to admit patient for intractable nausea to a telemetry bed under his care. We'll continue IV hydration. Mild hypokalemia noted. Her hyponatremia has since resolved. Troponin is negative. Her chest x-ray is currently pending.   Fayrene Helper, PA-C 03/01/15 1610  Marily Memos, MD 03/01/15 [phone]

## 2015-03-01 ENCOUNTER — Emergency Department (HOSPITAL_COMMUNITY): Payer: Medicare HMO

## 2015-03-01 ENCOUNTER — Encounter (HOSPITAL_COMMUNITY): Payer: Self-pay | Admitting: Internal Medicine

## 2015-03-01 DIAGNOSIS — R110 Nausea: Principal | ICD-10-CM

## 2015-03-01 DIAGNOSIS — E876 Hypokalemia: Secondary | ICD-10-CM | POA: Diagnosis present

## 2015-03-01 DIAGNOSIS — G2581 Restless legs syndrome: Secondary | ICD-10-CM | POA: Diagnosis present

## 2015-03-01 DIAGNOSIS — R4182 Altered mental status, unspecified: Secondary | ICD-10-CM | POA: Insufficient documentation

## 2015-03-01 LAB — CBC WITH DIFFERENTIAL/PLATELET
BASOS PCT: 0 %
Basophils Absolute: 0 10*3/uL (ref 0.0–0.1)
EOS ABS: 0 10*3/uL (ref 0.0–0.7)
Eosinophils Relative: 0 %
HCT: 30.9 % — ABNORMAL LOW (ref 36.0–46.0)
HEMOGLOBIN: 10.5 g/dL — AB (ref 12.0–15.0)
LYMPHS ABS: 0.8 10*3/uL (ref 0.7–4.0)
Lymphocytes Relative: 17 %
MCH: 31.5 pg (ref 26.0–34.0)
MCHC: 34 g/dL (ref 30.0–36.0)
MCV: 92.8 fL (ref 78.0–100.0)
MONO ABS: 0.7 10*3/uL (ref 0.1–1.0)
MONOS PCT: 14 %
NEUTROS PCT: 69 %
Neutro Abs: 3.2 10*3/uL (ref 1.7–7.7)
Platelets: 188 10*3/uL (ref 150–400)
RBC: 3.33 MIL/uL — ABNORMAL LOW (ref 3.87–5.11)
RDW: 13.2 % (ref 11.5–15.5)
WBC: 4.7 10*3/uL (ref 4.0–10.5)

## 2015-03-01 LAB — BASIC METABOLIC PANEL
Anion gap: 6 (ref 5–15)
BUN: 8 mg/dL (ref 6–20)
CALCIUM: 8 mg/dL — AB (ref 8.9–10.3)
CHLORIDE: 104 mmol/L (ref 101–111)
CO2: 27 mmol/L (ref 22–32)
CREATININE: 0.58 mg/dL (ref 0.44–1.00)
GFR calc non Af Amer: >60 mL/min (ref >60)
GLUCOSE: 101 mg/dL — AB (ref 65–99)
Potassium: 3.5 mmol/L (ref 3.5–5.1)
Sodium: 137 mmol/L (ref 135–145)

## 2015-03-01 LAB — I-STAT TROPONIN, ED: Troponin i, poc: 0.01 ng/mL (ref 0.00–0.08)

## 2015-03-01 MED ORDER — PANTOPRAZOLE SODIUM 40 MG IV SOLR
40.0000 mg | Freq: Once | INTRAVENOUS | Status: AC
  Administered 2015-03-01: 40 mg via INTRAVENOUS
  Filled 2015-03-01: qty 40

## 2015-03-01 MED ORDER — PREGABALIN 75 MG PO CAPS
200.0000 mg | ORAL_CAPSULE | Freq: Every day | ORAL | Status: DC
  Administered 2015-03-01 (×2): 200 mg via ORAL
  Filled 2015-03-01 (×2): qty 1

## 2015-03-01 MED ORDER — DIPHENHYDRAMINE HCL 50 MG/ML IJ SOLN
12.5000 mg | INTRAMUSCULAR | Status: DC | PRN
  Administered 2015-03-01: 12.5 mg via INTRAVENOUS
  Filled 2015-03-01: qty 1

## 2015-03-01 MED ORDER — DIPHENHYDRAMINE HCL 12.5 MG/5ML PO ELIX: 12.5000 mg | ORAL_SOLUTION | ORAL | Status: DC | PRN

## 2015-03-01 MED ORDER — SODIUM CHLORIDE 0.9 % IJ SOLN
3.0000 mL | Freq: Two times a day (BID) | INTRAMUSCULAR | Status: DC
  Administered 2015-03-01 (×2): 3 mL via INTRAVENOUS

## 2015-03-01 MED ORDER — ONDANSETRON HCL 4 MG PO TABS: 4.0000 mg | ORAL_TABLET | Freq: Four times a day (QID) | ORAL | Status: DC | PRN

## 2015-03-01 MED ORDER — PREGABALIN 50 MG PO CAPS
100.0000 mg | ORAL_CAPSULE | Freq: Every day | ORAL | Status: DC
  Administered 2015-03-01 – 2015-03-02 (×2): 100 mg via ORAL
  Filled 2015-03-01 (×3): qty 2

## 2015-03-01 MED ORDER — POTASSIUM CHLORIDE 10 MEQ/100ML IV SOLN
10.0000 meq | INTRAVENOUS | Status: AC
  Administered 2015-03-01 (×2): 10 meq via INTRAVENOUS
  Filled 2015-03-01 (×2): qty 100

## 2015-03-01 MED ORDER — LEVOTHYROXINE SODIUM 50 MCG PO TABS
50.0000 ug | ORAL_TABLET | Freq: Every day | ORAL | Status: DC
  Administered 2015-03-01 – 2015-03-02 (×2): 50 ug via ORAL
  Filled 2015-03-01 (×2): qty 1

## 2015-03-01 MED ORDER — POTASSIUM CHLORIDE IN NACL 20-0.9 MEQ/L-% IV SOLN
INTRAVENOUS | Status: DC
  Administered 2015-03-01 – 2015-03-02 (×3): via INTRAVENOUS
  Filled 2015-03-01 (×3): qty 1000

## 2015-03-01 MED ORDER — LOSARTAN POTASSIUM 50 MG PO TABS
100.0000 mg | ORAL_TABLET | Freq: Every day | ORAL | Status: DC
  Administered 2015-03-01 – 2015-03-02 (×2): 100 mg via ORAL
  Filled 2015-03-01 (×2): qty 2

## 2015-03-01 MED ORDER — MIRABEGRON ER 25 MG PO TB24
25.0000 mg | ORAL_TABLET | Freq: Every day | ORAL | Status: DC
  Administered 2015-03-01 – 2015-03-02 (×2): 25 mg via ORAL
  Filled 2015-03-01 (×2): qty 1

## 2015-03-01 MED ORDER — AMLODIPINE BESYLATE 10 MG PO TABS
10.0000 mg | ORAL_TABLET | Freq: Every day | ORAL | Status: DC
  Administered 2015-03-01: 10 mg via ORAL
  Filled 2015-03-01: qty 1

## 2015-03-01 MED ORDER — ASPIRIN EC 81 MG PO TBEC
81.0000 mg | DELAYED_RELEASE_TABLET | Freq: Every day | ORAL | Status: DC
  Administered 2015-03-01 – 2015-03-02 (×2): 81 mg via ORAL
  Filled 2015-03-01 (×2): qty 1

## 2015-03-01 MED ORDER — ENOXAPARIN SODIUM 40 MG/0.4ML ~~LOC~~ SOLN: 40.0000 mg | SUBCUTANEOUS | Status: DC

## 2015-03-01 MED ORDER — ENOXAPARIN SODIUM 30 MG/0.3ML ~~LOC~~ SOLN
30.0000 mg | SUBCUTANEOUS | Status: DC
  Administered 2015-03-01: 30 mg via SUBCUTANEOUS
  Filled 2015-03-01: qty 0.3

## 2015-03-01 MED ORDER — PANTOPRAZOLE SODIUM 40 MG PO TBEC
40.0000 mg | DELAYED_RELEASE_TABLET | Freq: Every day | ORAL | Status: DC
  Administered 2015-03-01 – 2015-03-02 (×2): 40 mg via ORAL
  Filled 2015-03-01 (×2): qty 1

## 2015-03-01 MED ORDER — ONDANSETRON HCL 4 MG/2ML IJ SOLN
4.0000 mg | Freq: Four times a day (QID) | INTRAMUSCULAR | Status: DC | PRN
Start: 2015-03-01 — End: 2015-03-02

## 2015-03-01 NOTE — Progress Notes (Signed)
PHARMACIST - PHYSICIAN COMMUNICATION  DR:  Jomarie LongsJoseph  CONCERNING: IV to Oral Route Change Policy  RECOMMENDATION: This patient is receiving diphenhydramine by the intravenous route.  Based on criteria approved by the Pharmacy and Therapeutics Committee, intravenous diphenhydramine is being converted to the equivalent oral dose form(s).   DESCRIPTION: These criteria include:  Diphenhydramine is not prescribed to treat or prevent a severe allergic reaction  Diphenhydramine is not prescribed as premedication prior to receiving blood product, biologic medication, antimicrobial, or chemotherapy agent  The patient has tolerated at least one dose of an oral or enteral medication  The patient has no evidence of active gastrointestinal bleeding or impaired GI absorption (gastrectomy, short bowel, patient on TNA or NPO).  The patient is not undergoing procedural sedation   If you have questions about this conversion, please contact the Pharmacy Department  []   [phone]( [phone] )  [facility] []   [phone]( [phone]  [facility] []   [phone]( [phone] )  [facility] []   [phone]( [phone]  [facility] [x]   [phone]( [phone] )  [facility]   Dorna LeitzAnh Artin Mceuen, PharmD, BCPS 03/01/2015 10:57 AM

## 2015-03-01 NOTE — H&P (Signed)
[facility] History and Physical  Pamela Peterson ZOX:[id number] DOB: 1919/04/16 DOA: 02/28/2015  Referring physician: Fayrene Helper, PA-C PCP: Leola Brazil, DO   Chief Complaint: Persistent nausea.  HPI: Pamela Peterson is a 82 y.o. female with a past medical history of hypertension, hypothyroidism, hyperlipidemia, osteoporosis, recurrent nausea without emesis who returns to the emergency department for the second time today due to recurrent persistent nausea without nausea which her daughter states that happened about once a month.  Patient came in this morning with complaints of nausea since the previous night that was not relieved by ODT Zofran. She received fluids and antiemetics and was discharged home, but the symptoms did not completely resolve and increase in intensity this evening, so they return back to the ED. She had a similar episode about 5 weeks ago which require an overnight hospitalization. She had an EGD done by GI several years ago and was diagnosed and treated for an esophageal stricture, GERD, and was started on Nexium after this according to her daughter. Her daughter denies abdominal pain or emesis, but she states that the patient occasionally has loose stools.  When seen in the ED, the patient was restless and moving her legs constantly, after 0.5 mg of lorazepam was given. The patient was awake, alert, very restless and oriented 3 when asked and mildly confused per patient's daughter Pamela Peterson.   Review of Systems:  Unable to fully review due to restlessness and mild confusion.  Past Medical History  Diagnosis Date  . Osteoporosis   . Hypothyroid   . Hypertension   . Hyperlipidemia    Past Surgical History  Procedure Laterality Date  . Bladder surgery     Social History:  reports that she has never smoked. She does not have any smokeless tobacco history on file. She reports that she does not drink alcohol or use illicit drugs.  Allergies  Allergen Reactions    . Penicillins Hives and Swelling    Has patient had a PCN reaction causing immediate rash, facial/tongue/throat swelling, SOB or lightheadedness with hypotension: no Has patient had a PCN reaction causing severe rash involving mucus membranes or skin necrosis: no Has patient had a PCN reaction that required hospitalization already in hospital  Has patient had a PCN reaction occurring within the last 10 years: no If all of the above answers are "NO", then may proceed with Cephalosporin use.   . Lorazepam Other (See Comments)    Restless legs    Family History  Problem Relation Age of Onset  . Tuberculosis Mother   . Heart attack Father   . Alcohol abuse Father   . Heart attack Brother   . COPD Sister   . Cancer Brother     Prior to Admission medications   Medication Sig Start Date End Date Taking? Authorizing Provider  esomeprazole (NEXIUM) 40 MG capsule Take 40 mg by mouth daily at 12 noon.   Yes Historical Provider, MD  levothyroxine (SYNTHROID, LEVOTHROID) 50 MCG tablet Take 50 mcg by mouth daily before breakfast.   Yes Historical Provider, MD  losartan (COZAAR) 100 MG tablet Take 100 mg by mouth daily.   Yes Historical Provider, MD  mirabegron ER (MYRBETRIQ) 25 MG TB24 tablet Take 25 mg by mouth daily.    Yes Historical Provider, MD  ondansetron (ZOFRAN) 8 MG tablet Take 1 tablet (8 mg total) by mouth every 8 (eight) hours as needed for nausea or vomiting. 02/28/15  Yes Rolan Bucco, MD  amLODipine (NORVASC) 10  MG tablet Take 10 mg by mouth at bedtime.    Historical Provider, MD  aspirin EC 81 MG tablet Take 81 mg by mouth daily.    Historical Provider, MD  CALCIUM-VITAMIN D PO Take 1 tablet by mouth 2 (two) times daily. Calcium 600- D3- 2000    Historical Provider, MD  Cholecalciferol (VITAMIN D3) 2000 UNITS TABS Take 1 tablet by mouth daily.    Historical Provider, MD  loperamide (IMODIUM) 2 MG capsule Take 1 capsule (2 mg total) by mouth every 8 (eight) hours as needed for  diarrhea or loose stools. Patient not taking: Reported on 03/01/2015 04/27/14   Layla MawKristen N Ward, DO  Multiple Vitamins-Minerals (MULTIVITAMIN & MINERAL PO) Take 1 tablet by mouth daily.    Historical Provider, MD  pregabalin (LYRICA) 100 MG capsule Take 200 mg by mouth 2 (two) times daily.    Historical Provider, MD   Physical Exam: Filed Vitals:   02/28/15 2021 02/28/15 2226 03/01/15 0023 03/01/15 0142  BP:  161/62 137/67 147/91  Pulse:  83 94 88  Temp:    97.5 F (36.4 C)  TempSrc:    Oral  Resp:  21 18 20   Height: 4\' 4"  (1.321 m)     Weight: 40.824 kg (90 lb)     SpO2:  96% 94% 95%    Wt Readings from Last 3 Encounters:  02/28/15 40.824 kg (90 lb)  01/23/15 43.1 kg (95 lb 0.3 oz)  12/28/14 43.092 kg (95 lb)    General:  Restless and agitated Eyes: PERRL, normal lids, irises & conjunctiva ENT: grossly normal hearing, lips and oral mucosae were mildly dry Neck: no LAD, masses or thyromegaly Cardiovascular: RRR, no m/r/g. No LE edema. Telemetry: SR, no arrhythmias  Respiratory: CTA bilaterally, no w/r/r. Normal respiratory effort. Abdomen: Bowel sounds positive, soft, ntnd Skin: no rash or induration seen on limited exam Musculoskeletal: grossly normal tone BUE/BLE Psychiatric: Restless and agitated. Her speech is pressured. Neurologic: Patient seemed to be grossly nonfocal. Unable to fully evaluate due to restlessness.           Labs on Admission:  Basic Metabolic Panel:  Recent Labs Lab 02/28/15 0900 02/28/15 2225  NA 128* 135  K 3.7 3.3*  CL 93* 104  CO2 26 22  GLUCOSE 127* 133*  BUN 12 9  CREATININE 0.51 0.58  CALCIUM 8.5* 8.2*   Liver Function Tests:  Recent Labs Lab 02/28/15 0900 02/28/15 2225  AST 34 37  ALT 16 15  ALKPHOS 48 45  BILITOT 0.8 0.8  PROT 6.3* 6.2*  ALBUMIN 4.1 3.9   CBC:  Recent Labs Lab 02/28/15 0900 02/28/15 2225  WBC 5.8 7.3  NEUTROABS 4.9 6.0  HGB 11.8* 12.0  HCT 34.5* 34.7*  MCV 91.5 94.0  PLT 219 233     Radiological Exams on Admission: Dg Chest Portable 1 View  03/01/2015  CLINICAL DATA:  Nausea and vomiting. EXAM: PORTABLE CHEST 1 VIEW COMPARISON:  Frontal and lateral views 01/24/2015 FINDINGS: Lower lung volumes from prior exam. Stable cardiomegaly and tortuous thoracic aorta allowing for differences in technique. Accentuated chronic bronchitic markings, likely secondary to low lung volumes. Small nodular and linear opacities in the right midlung zone air again seen, partially obscured by overlying monitoring leads in patient rotation. No evidence of pulmonary edema. No large pleural effusion or pneumothorax. Diffuse bony under mineralization. IMPRESSION: Stable cardiomegaly and chronic bronchitic change allowing for lower lung volumes and differences in technique. Nodular and linear opacities in the  right midlung zone are grossly stable from prior. Electronically Signed   By: Rubye Oaks M.D.   On: 03/01/2015 00:57    EKG: Independently reviewed.  Vent. rate 93 BPM PR interval 172 ms QRS duration 133 ms QT/QTc 442/550 ms P-R-T axes 82 -35 87. Sinus rhythm Left bundle branch block  Assessment/Plan Principal Problem:   Nausea without vomiting Active Problems:   GERD (gastroesophageal reflux disease) Protonix 40 mg IVP 1 dose. Continue oral PPI in the morning.    Essential hypertension Continue blood pressure monitoring. Resume oral antihypertensive agents in the morning.      Hypothyroidism Resume Synthroid in the morning.    Restless legs Paradoxical reaction on lorazepam. I will give low-dose Benadryl to help with nausea and anxiety. Patient to have her bedtime Lyrica now.    Hypokalemia Continue IV replacement. Check BMP in the morning.   Code Status: Full code. DVT Prophylaxis: SCDs. Family Communication: Her daughter He was present in the room and provided history. Pamela Peterson,Pamela Peterson Daughter [phone]  Disposition Plan: Admit for observation to telemetry and  symptoms treatment.  Time spent: Over 70 minutes were used in the process of his admission.  Bobette Mo [facility] Pager [phone].

## 2015-03-01 NOTE — ED Notes (Signed)
REPORT WILL BE GIVEN ON THE FLOOR 4W 1425-1. AAOX3, BUT RESTLESS. PT IN NO APPARENT DISTRESS OR PAIN. THE OPPORTUNITY TO ASK QUESTIONS WAS PROVIDED.

## 2015-03-01 NOTE — ED Notes (Signed)
HOSPITALIST AT THE BEDSIDE 

## 2015-03-01 NOTE — Progress Notes (Signed)
[facility] PROGRESS NOTE  Pamela Peterson ZOX:[id number] DOB: 12/02/1919 DOA: 02/28/2015 PCP: Leola BrazilEverly, Rebecca B, DO  Assessment/Plan: 1. Nausea -recurrent, intermittent issue, etiology unclear -now denies this, attempt regular diet -PPI, SLP eval -check Esophagram -no changes in meds,   2. Anxiety/failure to thrive -likely triggering this  3. HTN -stable, continue home meds  4. Hypothyroidism -continue synthroid, check TSH  5. Hypokalemia -replace  DVT proph: lovenox  Code Status: Full Code Family Communication: none at bedside Disposition Plan: home later today or tomorrow     HPI/Subjective: Feels ok, tells me she was anxious last pm, no N/V, ready to eat  Objective: Filed Vitals:   03/01/15 0255 03/01/15 0551  BP: 157/47 137/49  Pulse: 71 72  Temp: 98.6 F (37 C) 98.2 F (36.8 C)  Resp:  18    Intake/Output Summary (Last 24 hours) at 03/01/15 1412 Last data filed at 03/01/15 1203  Gross per 24 hour  Intake 876.25 ml  Output      0 ml  Net 876.25 ml   Filed Weights   02/28/15 2021 03/01/15 0255  Weight: 40.824 kg (90 lb) 40 kg (88 lb 2.9 oz)    Exam:   General:  AAOx3, anxious  Cardiovascular: S1S2/RRR  Respiratory: CTAB  Abdomen: soft, NT, BS present  Musculoskeletal: no edema c/c   Data Reviewed: Basic Metabolic Panel:  Recent Labs Lab 02/28/15 0900 02/28/15 2225 03/01/15 0548  NA 128* 135 137  K 3.7 3.3* 3.5  CL 93* 104 104  CO2 26 22 27   GLUCOSE 127* 133* 101*  BUN 12 9 8   CREATININE 0.51 0.58 0.58  CALCIUM 8.5* 8.2* 8.0*   Liver Function Tests:  Recent Labs Lab 02/28/15 0900 02/28/15 2225  AST 34 37  ALT 16 15  ALKPHOS 48 45  BILITOT 0.8 0.8  PROT 6.3* 6.2*  ALBUMIN 4.1 3.9   No results for input(s): LIPASE, AMYLASE in the last 168 hours. No results for input(s): AMMONIA in the last 168 hours. CBC:  Recent Labs Lab 02/28/15 0900 02/28/15 2225 03/01/15 0548  WBC 5.8 7.3 4.7  NEUTROABS 4.9 6.0 3.2   HGB 11.8* 12.0 10.5*  HCT 34.5* 34.7* 30.9*  MCV 91.5 94.0 92.8  PLT 219 233 188   Cardiac Enzymes: No results for input(s): CKTOTAL, CKMB, CKMBINDEX, TROPONINI in the last 168 hours. BNP (last 3 results) No results for input(s): BNP in the last 8760 hours.  ProBNP (last 3 results) No results for input(s): PROBNP in the last 8760 hours.  CBG: No results for input(s): GLUCAP in the last 168 hours.  No results found for this or any previous visit (from the past 240 hour(s)).   Studies: Dg Chest Portable 1 View  03/01/2015  CLINICAL DATA:  Nausea and vomiting. EXAM: PORTABLE CHEST 1 VIEW COMPARISON:  Frontal and lateral views 01/24/2015 FINDINGS: Lower lung volumes from prior exam. Stable cardiomegaly and tortuous thoracic aorta allowing for differences in technique. Accentuated chronic bronchitic markings, likely secondary to low lung volumes. Small nodular and linear opacities in the right midlung zone air again seen, partially obscured by overlying monitoring leads in patient rotation. No evidence of pulmonary edema. No large pleural effusion or pneumothorax. Diffuse bony under mineralization. IMPRESSION: Stable cardiomegaly and chronic bronchitic change allowing for lower lung volumes and differences in technique. Nodular and linear opacities in the right midlung zone are grossly stable from prior. Electronically Signed   By: Rubye OaksMelanie  Ehinger M.D.   On: 03/01/2015 00:57  Scheduled Meds: . amLODipine  10 mg Oral QHS  . aspirin EC  81 mg Oral Daily  . levothyroxine  50 mcg Oral QAC breakfast  . losartan  100 mg Oral Daily  . mirabegron ER  25 mg Oral Daily  . pantoprazole  40 mg Oral Daily  . pregabalin  100 mg Oral Daily  . pregabalin  200 mg Oral QHS  . sodium chloride  3 mL Intravenous Q12H   Continuous Infusions: . 0.9 % NaCl with KCl 20 mEq / L 75 mL/hr at 03/01/15 1610   Antibiotics Given (last 72 hours)    None      Principal Problem:   Nausea without  vomiting Active Problems:   Hypothyroidism   Essential hypertension   GERD (gastroesophageal reflux disease)   Restless legs   Hypokalemia    Time spent:    [facility]  [facility] Pager [phone]. If 7PM-7AM, please contact night-coverage at [email or web address], password [facility] 03/01/2015, 2:12 PM

## 2015-03-01 NOTE — ED Notes (Signed)
PT NOW VERY RESTLESS, BUT COOPERATIVE. PA AT THE BEDSIDE.

## 2015-03-02 ENCOUNTER — Observation Stay (HOSPITAL_COMMUNITY): Payer: Medicare HMO

## 2015-03-02 DIAGNOSIS — I10 Essential (primary) hypertension: Secondary | ICD-10-CM

## 2015-03-02 DIAGNOSIS — E876 Hypokalemia: Secondary | ICD-10-CM

## 2015-03-02 DIAGNOSIS — R1310 Dysphagia, unspecified: Secondary | ICD-10-CM | POA: Diagnosis not present

## 2015-03-02 DIAGNOSIS — R110 Nausea: Secondary | ICD-10-CM | POA: Diagnosis not present

## 2015-03-02 LAB — CBC
HCT: 33 % — ABNORMAL LOW (ref 36.0–46.0)
HEMOGLOBIN: 11.2 g/dL — AB (ref 12.0–15.0)
MCH: 32.5 pg (ref 26.0–34.0)
MCHC: 33.9 g/dL (ref 30.0–36.0)
MCV: 95.7 fL (ref 78.0–100.0)
PLATELETS: 193 10*3/uL (ref 150–400)
RBC: 3.45 MIL/uL — ABNORMAL LOW (ref 3.87–5.11)
RDW: 13.6 % (ref 11.5–15.5)
WBC: 4.1 10*3/uL (ref 4.0–10.5)

## 2015-03-02 LAB — BASIC METABOLIC PANEL
Anion gap: 6 (ref 5–15)
BUN: 15 mg/dL (ref 6–20)
CHLORIDE: 105 mmol/L (ref 101–111)
CO2: 25 mmol/L (ref 22–32)
CREATININE: 0.54 mg/dL (ref 0.44–1.00)
Calcium: 7.8 mg/dL — ABNORMAL LOW (ref 8.9–10.3)
GFR calc Af Amer: >60 mL/min (ref >60)
GFR calc non Af Amer: >60 mL/min (ref >60)
Glucose, Bld: 92 mg/dL (ref 65–99)
Potassium: 3.9 mmol/L (ref 3.5–5.1)
SODIUM: 136 mmol/L (ref 135–145)

## 2015-03-02 LAB — TSH: TSH: 4.106 u[IU]/mL (ref 0.350–4.500)

## 2015-03-02 MED ORDER — ONDANSETRON 4 MG PO TBDP: 4.0000 mg | ORAL_TABLET | Freq: Three times a day (TID) | ORAL | Status: DC | PRN

## 2015-03-02 MED ORDER — PROMETHAZINE HCL 12.5 MG PO TABS: 12.5000 mg | ORAL_TABLET | Freq: Four times a day (QID) | ORAL | Status: DC | PRN

## 2015-03-02 MED ORDER — SENNOSIDES-DOCUSATE SODIUM 8.6-50 MG PO TABS
1.0000 | ORAL_TABLET | Freq: Once | ORAL | Status: AC
  Administered 2015-03-02: 1 via ORAL
  Filled 2015-03-02: qty 1

## 2015-03-02 NOTE — Progress Notes (Signed)
OT Cancellation Note  Patient Details Name: Di KindleMary Mynhier MRN: [id number] DOB: 09/07/1919   Cancelled Treatment:    Reason Eval/Treat Not Completed: Patient at procedure or test/ unavailable. Will check back as schedule allows, possibly tomorrow.   Edwin CapPatricia Jhamal Plucinski , MS, OTR/L, CLT Pager: [phone]  03/02/2015, 9:53 AM

## 2015-03-02 NOTE — Care Management Note (Signed)
Case Management Note  Patient Details  Name: Pamela Peterson MRN: [id number] Date of Birth: 08/29/1919  Subjective/Objective: 82 y/o f admitted nausea. From home. 3n1 ordered-[facility] dme rep aware of order & d/c.                   Action/Plan:d/c home w/dme   Expected Discharge Date:                  Expected Discharge Plan:  Home/Self Care  In-House Referral:     Discharge planning Services  CM Consult  Post Acute Care Choice:    Choice offered to:     DME Arranged:  3-N-1 DME Agency:  [facility]  HH Arranged:    HH Agency:     Status of Service:  Completed, signed off  Medicare Important Message Given:    Date Medicare IM Given:    Medicare IM give by:    Date Additional Medicare IM Given:    Additional Medicare Important Message give by:     If discussed at Long Length of Stay Meetings, dates discussed:    Additional Comments:  Lanier ClamMahabir, Rondell Frick, RN 03/02/2015, 3:35 PM

## 2015-03-02 NOTE — Evaluation (Signed)
Physical Therapy One TIme Evaluation Patient Details Name: Pamela Peterson MRN: [id number] DOB: 06/28/1919 Today's Date: 03/02/2015   History of Present Illness  82 year old female with a history of hypertension, hyperlipidemia, gastroesophageal reflux disease, and admitted for recurrent persistent nausea.  Clinical Impression  Patient evaluated by Physical Therapy with no further acute PT needs identified. All education has been completed and the patient has no further questions.  Pt ambulated around unit and feels her mobility is at baseline.  Pt and daughter educated on some light activity and exercise pt could participate in at home (low reps, very low weight) as daughter asking for recommendations. See below for any follow-up Physical Therapy or equipment needs. PT is signing off. Thank you for this referral.     Follow Up Recommendations No PT follow up    Equipment Recommendations  None recommended by PT    Recommendations for Other Services       Precautions / Restrictions Precautions Precautions: Fall      Mobility  Bed Mobility               General bed mobility comments: pt up in recliner on arrival  Transfers Overall transfer level: Needs assistance Equipment used: None Transfers: Sit to/from Stand Sit to Stand: Min guard;Supervision            Ambulation/Gait Ambulation/Gait assistance: Min guard;Supervision Ambulation Distance (Feet): 400 Feet Assistive device: None Gait Pattern/deviations: Step-through pattern;Decreased stride length     General Gait Details: slow pace, a little unsteady at times however pt self corrected - states she hasn't been walking very much since admission, feels gait is at baseline  Stairs            Wheelchair Mobility    Modified Rankin (Stroke Patients Only)       Balance Overall balance assessment:  (pt and daughter deny recent falls)                                           Pertinent  Vitals/Pain Pain Assessment: No/denies pain    Home Living Family/patient expects to be discharged to:: Private residence Living Arrangements: Children Available Help at Discharge: Family Type of Home: House Home Access: Stairs to enter Entrance Stairs-Rails: Right Entrance Stairs-Number of Steps: 3 Home Layout: Able to live on main level with bedroom/bathroom Home Equipment: Environmental consultantWalker - 2 wheels      Prior Function Level of Independence: Independent               Hand Dominance        Extremity/Trunk Assessment               Lower Extremity Assessment: Overall WFL for tasks assessed      Cervical / Trunk Assessment: Kyphotic  Communication   Communication: No difficulties  Cognition Arousal/Alertness: Awake/alert Behavior During Therapy: WFL for tasks assessed/performed Overall Cognitive Status: Within Functional Limits for tasks assessed                      General Comments      Exercises        Assessment/Plan    PT Assessment Patent does not need any further PT services  PT Diagnosis Difficulty walking   PT Problem List    PT Treatment Interventions     PT Goals (Current goals can be found in the Care  Plan section) Acute Rehab PT Goals PT Goal Formulation: All assessment and education complete, DC therapy    Frequency     Barriers to discharge        Co-evaluation               End of Session   Activity Tolerance: Patient tolerated treatment well Patient left: in chair;with call bell/phone within reach;with chair alarm set;with family/visitor present Nurse Communication: Mobility status    Functional Assessment Tool Used: clinical judgement Functional Limitation: Mobility: Walking and moving around Mobility: Walking and Moving Around Current Status (Z6109): At least 1 percent but less than 20 percent impaired, limited or restricted Mobility: Walking and Moving Around Goal Status [phone]): 0 percent impaired, limited or  restricted Mobility: Walking and Moving Around Discharge Status [phone]): 0 percent impaired, limited or restricted    Time: 9147-8295 PT Time Calculation (min) (ACUTE ONLY): 14 min   Charges:   PT Evaluation $PT Eval Low Complexity: 1 Procedure     PT G Codes:   PT G-Codes **NOT FOR INPATIENT CLASS** Functional Assessment Tool Used: clinical judgement Functional Limitation: Mobility: Walking and moving around Mobility: Walking and Moving Around Current Status (A2130): At least 1 percent but less than 20 percent impaired, limited or restricted Mobility: Walking and Moving Around Goal Status [phone]): 0 percent impaired, limited or restricted Mobility: Walking and Moving Around Discharge Status [phone]): 0 percent impaired, limited or restricted    Hershy Flenner,KATHrine E 03/02/2015, 12:32 PM Zenovia Jarred, PT, DPT 03/02/2015 Pager: [phone]

## 2015-03-02 NOTE — Progress Notes (Signed)
Patient discharged home with daughter, discharge instructions given and explained to patient/daughter and they verbalized understanding, patient denies any pain/distress. Skin intact, no wound noted. Accompanied home by daughter, transported to the car by staff via wheelchair.

## 2015-03-02 NOTE — Evaluation (Signed)
Clinical/Bedside Swallow Evaluation Patient Details  Name: Pamela Peterson MRN: [id number] Date of Birth: September 30, 1919  Today's Date: 03/02/2015 Time: SLP Start Time (ACUTE ONLY): 1605 SLP Stop Time (ACUTE ONLY): 1630 SLP Time Calculation (min) (ACUTE ONLY): 25 min  Past Medical History:  Past Medical History  Diagnosis Date  . Osteoporosis   . Hypothyroid   . Hypertension   . Hyperlipidemia    Past Surgical History:  Past Surgical History  Procedure Laterality Date  . Bladder surgery     HPI:  Pamela Peterson is a 82 y.o. female with a past medical history of hypertension, hypothyroidism, hyperlipidemia, osteoporosis, recurrent nausea without emesis who returns to the emergency department for the second time today due to recurrent persistent nausea without nausea per MD note.  Daughter states that happens every few months to this SLP and pt/daughter can not identify source.  Pt has undergone esophageal dilatation in the past (last time a few years ago) but reports it was not very helpful.  Daughter reports she thinks GERD contributes to pt's symptoms.  Pt admits to eating a snack late at night and waking in the middle of the night sometimes and drinking hot chocolate.  Pt is maintaning hydration and nutrition per pt and daughter.  SLP swallow evaluation ordered.    Assessment / Plan / Recommendation Clinical Impression  Pt presents with symptoms of known esophageal dysmotlity and observe pt to be kyphotic.  CN exam unremarkable.  SLP provided pt and daughter with written compensation strategies to mitigate her dysphagia including to start meals with liquids, consume liquids t/o meal, cease intake if sense residuals.  Also advised to consider consuming liquid supplements if deficits noted as this may be easier to clear esophagus than solids.  Several small meals each day recommended which pt states she does.  Of note, pt did not sense delayed clearance during esophagram today.  Xerostomia reported by pt  on occasion for which SLP provided mitigations.  Pt does report occasional sensation of food or pills lodging in throat = suspect this is due to referrant sensation from distal esophagus.  No follow up indicated as all education completed.     Aspiration Risk  Mild aspiration risk    Diet Recommendation Regular;Thin liquid   Liquid Administration via: Cup;Straw Medication Administration: Whole meds with liquid Supervision: Patient able to self feed Compensations: Slow rate;Small sips/bites;Follow solids with liquid (start meal with liquids) Postural Changes: Remain upright for at least 30 minutes after po intake;Seated upright at 90 degrees    Other  Recommendations Oral Care Recommendations: Oral care BID   Follow up Recommendations  None    Frequency and Duration     n/a       Prognosis        Swallow Study   General Date of Onset: 03/02/15 HPI: Pamela Peterson is a 82 y.o. female with a past medical history of hypertension, hypothyroidism, hyperlipidemia, osteoporosis, recurrent nausea without emesis who returns to the emergency department for the second time today due to recurrent persistent nausea without nausea per MD note.  Daughter states that happens every few months to this SLP and pt/daughter can not identify source.  Pt has undergone esophageal dilatation in the past (last time a few years ago) but reports it was not very helpful.  Daughter reports she thinks GERD contributes to pt's symptoms.  Pt admits to eating a snack late at night and waking in the middle of the night sometimes and drinking hot chocolate.  Pt is maintaning hydration and nutrition per pt and daughter.  SLP swallow evaluation ordered.  Type of Study: Bedside Swallow Evaluation Diet Prior to this Study: Regular;Thin liquids Temperature Spikes Noted: No Respiratory Status: Room air History of Recent Intubation: No Behavior/Cognition: Alert;Cooperative;Pleasant mood Oral Cavity Assessment: Within Functional  Limits (pt admits to xerstomia *some* and tips provided) Oral Care Completed by SLP: No Oral Cavity - Dentition: Adequate natural dentition Vision: Functional for self-feeding Self-Feeding Abilities: Able to feed self Patient Positioning: Upright in bed (pt is kyphotic) Baseline Vocal Quality: Normal Volitional Cough: Strong Volitional Swallow: Able to elicit    Oral/Motor/Sensory Function Overall Oral Motor/Sensory Function: Within functional limits   Ice Chips Ice chips: Not tested   Thin Liquid Thin Liquid: Within functional limits Presentation: Cup;Self Fed Other Comments: belching    Nectar Thick Nectar Thick Liquid: Not tested   Honey Thick Honey Thick Liquid: Not tested   Puree Puree: Not tested   Solid   GO Functional Assessment Tool Used: clinical judgement Functional Limitations: Swallowing Swallow Current Status (W2956(G8996): At least 1 percent but less than 20 percent impaired, limited or restricted Swallow Goal Status [phone]): At least 1 percent but less than 20 percent impaired, limited or restricted Swallow Discharge Status [phone](G8998): At least 1 percent but less than 20 percent impaired, limited or restricted  Solid: Not tested       Donavan Burnetamara Davarion Cuffee, MS [facility] SLP [phone]

## 2015-03-02 NOTE — Discharge Summary (Signed)
Physician Discharge Summary  Pamela Peterson ZOX:[id number] DOB: [birth year] DOA: 02/28/2015  PCP: Hope Pigeon B, Pamela Peterson  Admit date: 02/28/2015 Discharge date: 03/02/2015  Time spent: 45 minutes  Recommendations for Outpatient Follow-up:  1. PCP Dr.Everly in 1 week   Discharge Diagnoses:  Principal Problem:   Nausea    Esophageal dysmotility   Hypothyroidism   Essential hypertension   GERD (gastroesophageal reflux disease)   Restless legs   Hypokalemia   Dysphagia   Discharge Condition: stable  Diet recommendation: regular  Filed Weights   02/28/15 2021 03/01/15 0255  Weight: 40.824 kg (90 lb) 40 kg (88 lb 2.9 oz)    History of present illness:  Chief Complaint: Persistent nausea HPI: Pamela Peterson is a 82 y.o. female with a past medical history of hypertension, hypothyroidism, hyperlipidemia, osteoporosis, recurrent nausea without emesis who returns to the emergency department for the second time today due to recurrent persistent nausea without nausea which her daughter states that happened about once a month.  Patient came in this morning with complaints of nausea since the previous night that was not relieved by ODT Zofran. She received fluids and antiemetics and was discharged home, but the symptoms did not completely resolve and increase in intensity this evening, so they return back to the ED. She had a similar episode about 5 weeks ago which require an overnight hospitalization. She had an EGD done by GI several years ago and was diagnosed and treated for an esophageal stricture, GERD, and was started on Nexium after this according to her daughter.   Hospital Course:  1. Nausea -recurrent, intermittent issue, didn't have any nausea while inpatient, tolerated all her meals without symptoms -continue PPI, SLP eval pending -no causative med identified -Esophagram completed, showed severe esophageal dysmotility, discussed with daughter regarding this, the fact that there arent good  treatment options, patient has intermittent symptoms that recur every few weeks or months, advised against metoclopramide due to neurological side effects, discussed trying Zofran ODt or phenergan at home, liquids etc when this recurs down the road  -FU with PCP    2. Anxiety -stable  3. HTN -stable, continue home meds  4. Hypothyroidism -continue synthroid,  TSH normal  5. Hypokalemia -replaced  Procedures:  Esophagram: severe esophageal dysmotility  Discharge Exam: Filed Vitals:   03/02/15 1005 03/02/15 1420  BP: 136/72 155/74  Pulse: 71 65  Temp: 98 F (36.7 C) 98 F (36.7 C)  Resp: 18 18    General: AAOx3 Cardiovascular: S1S2/RRR Respiratory: CTAB  Discharge Instructions   Discharge Instructions    Diet - low sodium heart healthy    Complete by:  As directed      Diet - low sodium heart healthy    Complete by:  As directed      Increase activity slowly    Complete by:  As directed      Increase activity slowly    Complete by:  As directed           Current Discharge Medication List    START taking these medications   Details  ondansetron (ZOFRAN ODT) 4 MG disintegrating tablet Take 1 tablet (4 mg total) by mouth every 8 (eight) hours as needed for nausea or vomiting. Qty: 20 tablet, Refills: 0    promethazine (PHENERGAN) 12.5 MG tablet Take 1 tablet (12.5 mg total) by mouth every 6 (six) hours as needed for refractory nausea / vomiting. Qty: 30 tablet, Refills: 0      CONTINUE these medications  which have NOT CHANGED   Details  esomeprazole (NEXIUM) 40 MG capsule Take 40 mg by mouth daily at 12 noon.    levothyroxine (SYNTHROID, LEVOTHROID) 50 MCG tablet Take 50 mcg by mouth daily before breakfast.    losartan (COZAAR) 100 MG tablet Take 100 mg by mouth daily.    mirabegron ER (MYRBETRIQ) 25 MG TB24 tablet Take 25 mg by mouth daily.     amLODipine (NORVASC) 10 MG tablet Take 10 mg by mouth at bedtime.    aspirin EC 81 MG tablet Take 81 mg by  mouth daily.    CALCIUM-VITAMIN D PO Take 1 tablet by mouth 2 (two) times daily. Calcium 600- D3- 2000    Cholecalciferol (VITAMIN D3) 2000 UNITS TABS Take 1 tablet by mouth daily.    Multiple Vitamins-Minerals (MULTIVITAMIN & MINERAL PO) Take 1 tablet by mouth daily.    pregabalin (LYRICA) 100 MG capsule Take 200 mg by mouth 2 (two) times daily.      STOP taking these medications     ondansetron (ZOFRAN) 8 MG tablet      loperamide (IMODIUM) 2 MG capsule        Allergies  Allergen Reactions  . Penicillins Hives and Swelling    Has patient had a PCN reaction causing immediate rash, facial/tongue/throat swelling, SOB or lightheadedness with hypotension: no Has patient had a PCN reaction causing severe rash involving mucus membranes or skin necrosis: no Has patient had a PCN reaction that required hospitalization already in hospital  Has patient had a PCN reaction occurring within the last 10 years: no If all of the above answers are "NO", then may proceed with Cephalosporin use.   . Lorazepam Other (See Comments)    Restless legs   Follow-up Information    Follow up with Hope Pigeon B, Pamela Peterson. Schedule an appointment as soon as possible for a visit in 1 week.   Specialty:  Internal Medicine   Contact information:   [address] [place] [address] [phone]        The results of significant diagnostics from this hospitalization (including imaging, microbiology, ancillary and laboratory) are listed below for reference.    Significant Diagnostic Studies: Dg Esophagus  03/02/2015  CLINICAL DATA:  Nausea EXAM: ESOPHOGRAM/BARIUM SWALLOW TECHNIQUE: Single contrast examination was performed using  thin barium. FLUOROSCOPY TIME:  Radiation Exposure Index (as provided by the fluoroscopic device): 12.2 d Gy cm2 COMPARISON:  None. FINDINGS: Severe esophageal dysmotility with numerous nonperistaltic/tertiary contractions. No definite fixed esophageal narrowing  or stricture. A barium tablet was not administered due to esophageal dysmotility. The patient could not be imaged in the prone position and was not assessed for gastroesophageal reflux. IMPRESSION: Severe esophageal dysmotility. Electronically Signed   By: Charline Bills M.D.   On: 03/02/2015 10:20   Dg Chest Portable 1 View  03/01/2015  CLINICAL DATA:  Nausea and vomiting. EXAM: PORTABLE CHEST 1 VIEW COMPARISON:  Frontal and lateral views 01/24/2015 FINDINGS: Lower lung volumes from prior exam. Stable cardiomegaly and tortuous thoracic aorta allowing for differences in technique. Accentuated chronic bronchitic markings, likely secondary to low lung volumes. Small nodular and linear opacities in the right midlung zone air again seen, partially obscured by overlying monitoring leads in patient rotation. No evidence of pulmonary edema. No large pleural effusion or pneumothorax. Diffuse bony under mineralization. IMPRESSION: Stable cardiomegaly and chronic bronchitic change allowing for lower lung volumes and differences in technique. Nodular and linear opacities in the right midlung zone  are grossly stable from prior. Electronically Signed   By: Rubye OaksMelanie  Ehinger M.D.   On: 03/01/2015 00:57    Microbiology: No results found for this or any previous visit (from the past 240 hour(s)).   Labs: Basic Metabolic Panel:  Recent Labs Lab 02/28/15 0900 02/28/15 2225 03/01/15 0548 03/02/15 0541  NA 128* 135 137 136  K 3.7 3.3* 3.5 3.9  CL 93* 104 104 105  CO2 26 22 27 25   GLUCOSE 127* 133* 101* 92  BUN 12 9 8 15   CREATININE 0.51 0.58 0.58 0.54  CALCIUM 8.5* 8.2* 8.0* 7.8*   Liver Function Tests:  Recent Labs Lab 02/28/15 0900 02/28/15 2225  AST 34 37  ALT 16 15  ALKPHOS 48 45  BILITOT 0.8 0.8  PROT 6.3* 6.2*  ALBUMIN 4.1 3.9   No results for input(s): LIPASE, AMYLASE in the last 168 hours. No results for input(s): AMMONIA in the last 168 hours. CBC:  Recent Labs Lab 02/28/15 0900  02/28/15 2225 03/01/15 0548 03/02/15 0541  WBC 5.8 7.3 4.7 4.1  NEUTROABS 4.9 6.0 3.2  --   HGB 11.8* 12.0 10.5* 11.2*  HCT 34.5* 34.7* 30.9* 33.0*  MCV 91.5 94.0 92.8 95.7  PLT 219 233 188 193   Cardiac Enzymes: No results for input(s): CKTOTAL, CKMB, CKMBINDEX, TROPONINI in the last 168 hours. BNP: BNP (last 3 results) No results for input(s): BNP in the last 8760 hours.  ProBNP (last 3 results) No results for input(s): PROBNP in the last 8760 hours.  CBG: No results for input(s): GLUCAP in the last 168 hours.     SignedZannie Cove:  Cristal Qadir MD  FACP  [facility] 03/02/2015, 2:35 PM

## 2015-03-02 NOTE — Evaluation (Signed)
Occupational Therapy Evaluation Patient Details Name: Pamela Peterson MRN: [id number] DOB: 06/24/1919 Today's Date: 03/02/2015    History of Present Illness 82 year old female with a history of hypertension, hyperlipidemia, gastroesophageal reflux disease, and admitted for recurrent persistent nausea.   Clinical Impression   Patient admitted with above. Patient independent to mod I PTA. Patient currently functioning at an overall mod I level (for increased time).  No additional OT needs identified, D/C from acute OT services and no additional follow-up OT needs at this time. All appropriate education provided to patient and family (daughter). Please re-order OT if needed.    Also discussed pursed lip breathing to help decrease anxiety. Discussed this with patient and patient's daughter.     Follow Up Recommendations  No OT follow up;Supervision - Intermittent    Equipment Recommendations  3 in 1 bedside comode    Recommendations for Other Services  None at this time  Precautions / Restrictions Precautions Precautions: Fall Restrictions Weight Bearing Restrictions: No    Mobility Bed Mobility General bed mobility comments: Seated EOB upon OT entering and exiting room   Transfers Overall transfer level: Needs assistance Equipment used: None Transfers: Sit to/from Stand Sit to Stand: Modified independent (Device/Increase time) General transfer comment: mod I for increased time    Balance Overall balance assessment: Needs assistance Sitting-balance support: No upper extremity supported;Feet supported Sitting balance-Leahy Scale: Good     Standing balance support: No upper extremity supported;During functional activity Standing balance-Leahy Scale: Good    ADL Overall ADL's : Modified independent General ADL Comments: mod I for increased time. Discussed use of 3-n-1 over toilet seat for safety and patient & daughter agreed.     Pertinent Vitals/Pain Pain Assessment: No/denies  pain     Hand Dominance Left   Extremity/Trunk Assessment Upper Extremity Assessment Upper Extremity Assessment: Overall WFL for tasks assessed   Lower Extremity Assessment Lower Extremity Assessment: Overall WFL for tasks assessed   Cervical / Trunk Assessment Cervical / Trunk Assessment: Kyphotic   Communication Communication Communication: No difficulties   Cognition Arousal/Alertness: Awake/alert Behavior During Therapy: WFL for tasks assessed/performed Overall Cognitive Status: Within Functional Limits for tasks assessed              Home Living Family/patient expects to be discharged to:: Private residence Living Arrangements: Children Available Help at Discharge: Family Type of Home: House Home Access: Stairs to enter Secretary/administratorntrance Stairs-Number of Steps: 3 Entrance Stairs-Rails: Right Home Layout: Able to live on main level with bedroom/bathroom     Bathroom Shower/Tub: Other (comment) (walk in tub)   Bathroom Toilet: Standard     Home Equipment: Environmental consultantWalker - 2 wheels   Prior Functioning/Environment Level of Independence: Independent     OT Diagnosis: Generalized weakness   OT Problem List:   N/a, no acute OT needs identified at this time     OT Treatment/Interventions:   N/a, no acute OT needs identified at this time     OT Goals(Current goals can be found in the care plan section) Acute Rehab OT Goals Patient Stated Goal: go home today OT Goal Formulation: All assessment and education complete, DC therapy  OT Frequency:   N/a, no acute OT needs identified at this time     Barriers to D/C:  none at this time    End of Session Nurse Communication: Other (comment) ([facility])  Activity Tolerance: Patient tolerated treatment well Patient left: in bed;with call bell/phone within reach;with family/visitor present (seated EOB)  Time: 1202-1215 OT Time Calculation (min): 13 min Charges:  OT General Charges $OT Visit: 1  Procedure OT Evaluation $OT Eval Low Complexity: 1 Procedure G-Codes: OT G-codes **NOT FOR INPATIENT CLASS** Functional Limitation: Self care Self Care Current Status (K4401): At least 1 percent but less than 20 percent impaired, limited or restricted (mod I) Self Care Goal Status (U2725): At least 1 percent but less than 20 percent impaired, limited or restricted (mod I) Self Care Discharge Status [phone]): At least 1 percent but less than 20 percent impaired, limited or restricted (mod I)  Edwin Cap , MS, OTR/L, CLT Pager: [phone]  03/02/2015, 12:43 PM

## 2015-06-09 ENCOUNTER — Encounter (HOSPITAL_COMMUNITY): Payer: Self-pay | Admitting: Family Medicine

## 2015-06-09 ENCOUNTER — Emergency Department (HOSPITAL_COMMUNITY)
Admission: EM | Admit: 2015-06-09 | Discharge: 2015-06-10 | Disposition: A | Discharge status: Home / Self Care | Payer: Medicare HMO | Attending: Emergency Medicine | Admitting: Emergency Medicine

## 2015-06-09 DIAGNOSIS — R110 Nausea: Secondary | ICD-10-CM | POA: Insufficient documentation

## 2015-06-09 DIAGNOSIS — E039 Hypothyroidism, unspecified: Secondary | ICD-10-CM | POA: Insufficient documentation

## 2015-06-09 DIAGNOSIS — Z880 Allergy status to penicillin: Secondary | ICD-10-CM | POA: Insufficient documentation

## 2015-06-09 DIAGNOSIS — Z7982 Long term (current) use of aspirin: Secondary | ICD-10-CM | POA: Diagnosis not present

## 2015-06-09 DIAGNOSIS — Z79899 Other long term (current) drug therapy: Secondary | ICD-10-CM | POA: Insufficient documentation

## 2015-06-09 DIAGNOSIS — I10 Essential (primary) hypertension: Secondary | ICD-10-CM | POA: Insufficient documentation

## 2015-06-09 LAB — COMPREHENSIVE METABOLIC PANEL
ALT: 18 U/L (ref 14–54)
AST: 47 U/L — AB (ref 15–41)
Albumin: 4 g/dL (ref 3.5–5.0)
Alkaline Phosphatase: 63 U/L (ref 38–126)
Anion gap: 10 (ref 5–15)
BUN: 12 mg/dL (ref 6–20)
CHLORIDE: 90 mmol/L — AB (ref 101–111)
CO2: 27 mmol/L (ref 22–32)
CREATININE: 0.68 mg/dL (ref 0.44–1.00)
Calcium: 9 mg/dL (ref 8.9–10.3)
GFR calc non Af Amer: >60 mL/min (ref >60)
Glucose, Bld: 143 mg/dL — ABNORMAL HIGH (ref 65–99)
POTASSIUM: 4.7 mmol/L (ref 3.5–5.1)
SODIUM: 127 mmol/L — AB (ref 135–145)
Total Bilirubin: 0.8 mg/dL (ref 0.3–1.2)
Total Protein: 6.7 g/dL (ref 6.5–8.1)

## 2015-06-09 LAB — CBC
HEMATOCRIT: 35.6 % — AB (ref 36.0–46.0)
Hemoglobin: 12.6 g/dL (ref 12.0–15.0)
MCH: 31.5 pg (ref 26.0–34.0)
MCHC: 35.4 g/dL (ref 30.0–36.0)
MCV: 89 fL (ref 78.0–100.0)
PLATELETS: 204 10*3/uL (ref 150–400)
RBC: 4 MIL/uL (ref 3.87–5.11)
RDW: 13.1 % (ref 11.5–15.5)
WBC: 5.7 10*3/uL (ref 4.0–10.5)

## 2015-06-09 LAB — URINALYSIS, ROUTINE W REFLEX MICROSCOPIC
Bilirubin Urine: NEGATIVE
GLUCOSE, UA: NEGATIVE mg/dL
HGB URINE DIPSTICK: NEGATIVE
Ketones, ur: 40 mg/dL — AB
LEUKOCYTES UA: NEGATIVE
Nitrite: NEGATIVE
PH: 7 (ref 5.0–8.0)
PROTEIN: NEGATIVE mg/dL
SPECIFIC GRAVITY, URINE: 1.012 (ref 1.005–1.030)

## 2015-06-09 LAB — LIPASE, BLOOD: LIPASE: 30 U/L (ref 11–51)

## 2015-06-09 MED ORDER — ONDANSETRON HCL 4 MG/2ML IJ SOLN
4.0000 mg | Freq: Once | INTRAMUSCULAR | Status: AC
  Administered 2015-06-09: 4 mg via INTRAVENOUS
  Filled 2015-06-09: qty 2

## 2015-06-09 MED ORDER — PANTOPRAZOLE SODIUM 40 MG IV SOLR
40.0000 mg | Freq: Once | INTRAVENOUS | Status: AC
  Administered 2015-06-09: 40 mg via INTRAVENOUS
  Filled 2015-06-09: qty 40

## 2015-06-09 MED ORDER — ONDANSETRON 4 MG PO TBDP
ORAL_TABLET | ORAL | Status: AC
  Filled 2015-06-09: qty 1

## 2015-06-09 MED ORDER — ONDANSETRON 4 MG PO TBDP
4.0000 mg | ORAL_TABLET | Freq: Once | ORAL | Status: AC | PRN
  Administered 2015-06-09: 4 mg via ORAL

## 2015-06-09 MED ORDER — PROCHLORPERAZINE EDISYLATE 5 MG/ML IJ SOLN
5.0000 mg | Freq: Once | INTRAMUSCULAR | Status: AC
  Administered 2015-06-10: 5 mg via INTRAVENOUS
  Filled 2015-06-09: qty 2

## 2015-06-09 MED ORDER — SODIUM CHLORIDE 0.9 % IV BOLUS (SEPSIS)
500.0000 mL | Freq: Once | INTRAVENOUS | Status: AC
  Administered 2015-06-09: 500 mL via INTRAVENOUS

## 2015-06-09 NOTE — ED Provider Notes (Signed)
Medical screening examination/treatment/procedure(s) were conducted as a shared visit with non-physician practitioner(s) and myself.  I personally evaluated the patient during the encounter.  Has issues with recurrent dehydration that get worked up and no answer found. Started again this AM and was not wanting to drink or eat. On my exam after some fluids and anti-emetics she was drinking water without difficulty. VS WNL, abdomen w/o TTP, lungs ctab, heart rrr. No distress. No vomiting.  Likely her recurrent vomiting. On review of records it appears that she has an esophageal dysmotility problem that is likely related. Also sounds likethere is not much to do for it. If patient continues to tolerate PO, is likely stable for dc.    EKG Interpretation   Date/Time:  Wednesday June 09 2015 23:38:30 EDT Ventricular Rate:  72 PR Interval:  162 QRS Duration: 145 QT Interval:  451 QTC Calculation: 494 R Axis:   -35 Text Interpretation:  Sinus rhythm Left bundle branch block No significant  change since last tracing no scarbossa Confirmed by Erroll Lunani, Adeleke Ayokunle  [phone]) on 06/09/2015 11:42:53 PM Also confirmed by Erroll Lunani, Adeleke Ayokunle  [phone]), editor [place]  CCT, BEVERLY ([id number])  on 06/10/2015 8:08:55 AM        Marily MemosJason Jeron Grahn, MD 06/10/15 1046

## 2015-06-09 NOTE — ED Provider Notes (Signed)
CSN: [id number]     Arrival date & time 06/09/15  1748 History   First MD Initiated Contact with Patient 06/09/15 2030     Chief Complaint  Patient presents with  . Nausea  . Dehydration     (Consider location/radiation/quality/duration/timing/severity/associated sxs/prior Treatment) The history is provided by the patient, a relative and medical records. No language interpreter was used.   Pamela Peterson is a 82 y.o. female  with a PMH of HTN, HLD, osteoporosis, and hypothyroidism who presents to the Emergency Department complaining of constant nausea beginning this morning. Per patient and daughter at bedside, this is a chronic problem. Patient is followed by GI and has had scopes done with no explanation for why she is nauseous. Patient denies vomiting, diarrhea, constipation, abdominal pain, chest pain, back pain, and dysuria. She states she has no complaints at this time other than nausea which is c/w chronic nauseous episodes which usually improve with anti-nausea medications and fluids. No alleviating or aggravating factors noted.   Past Medical History  Diagnosis Date  . Osteoporosis   . Hypothyroid   . Hypertension   . Hyperlipidemia    Past Surgical History  Procedure Laterality Date  . Bladder surgery     Family History  Problem Relation Age of Onset  . Tuberculosis Mother   . Heart attack Father   . Alcohol abuse Father   . Heart attack Brother   . COPD Sister   . Cancer Brother    Social History  Substance Use Topics  . Smoking status: Never Smoker   . Smokeless tobacco: None  . Alcohol Use: No   OB History    No data available     Review of Systems  Constitutional: Negative for fever.  HENT: Negative for congestion.   Eyes: Negative for visual disturbance.  Respiratory: Negative for cough and shortness of breath.   Cardiovascular: Negative.   Gastrointestinal: Positive for nausea. Negative for vomiting, abdominal pain, diarrhea, constipation and blood in  stool.  Genitourinary: Negative for dysuria.  Musculoskeletal: Negative for myalgias.  Skin: Negative for rash.  Neurological: Negative for dizziness and headaches.      Allergies  Penicillins and Lorazepam  Home Medications   Prior to Admission medications   Medication Sig Start Date End Date Taking? Authorizing Provider  amLODipine (NORVASC) 10 MG tablet Take 10 mg by mouth at bedtime.   Yes Historical Provider, MD  aspirin EC 81 MG tablet Take 81 mg by mouth daily.   Yes Historical Provider, MD  bismuth subsalicylate (PEPTO BISMOL) 262 MG/15ML suspension Take 30 mLs by mouth every 6 (six) hours as needed for indigestion.   Yes Historical Provider, MD  CALCIUM-VITAMIN D PO Take 1 tablet by mouth 2 (two) times daily. Calcium 600- D3- 2000   Yes Historical Provider, MD  esomeprazole (NEXIUM) 40 MG capsule Take 40 mg by mouth daily at 12 noon.   Yes Historical Provider, MD  levothyroxine (SYNTHROID, LEVOTHROID) 50 MCG tablet Take 50 mcg by mouth daily before breakfast.   Yes Historical Provider, MD  losartan (COZAAR) 100 MG tablet Take 100 mg by mouth daily.   Yes Historical Provider, MD  mirabegron ER (MYRBETRIQ) 25 MG TB24 tablet Take 25 mg by mouth daily.    Yes Historical Provider, MD  Multiple Vitamins-Minerals (MULTIVITAMIN & MINERAL PO) Take 1 tablet by mouth daily.   Yes Historical Provider, MD  pregabalin (LYRICA) 100 MG capsule Take 100-200 mg by mouth 2 (two) times daily. TAKES 1 CAP  IN AM AND 2 CAPS IN PM   Yes Historical Provider, MD  traZODone (DESYREL) 50 MG tablet Take 50 mg by mouth at bedtime as needed for sleep.   Yes Historical Provider, MD  ondansetron (ZOFRAN ODT) 4 MG disintegrating tablet Take 1 tablet (4 mg total) by mouth every 8 (eight) hours as needed for nausea or vomiting. 03/02/15   Zannie Cove, MD  promethazine (PHENERGAN) 12.5 MG tablet Take 1 tablet (12.5 mg total) by mouth every 6 (six) hours as needed for refractory nausea / vomiting. 03/02/15   Zannie Cove, MD   BP 148/86 mmHg  Pulse 82  Temp(Src) 98.1 F (36.7 C) (Oral)  Resp 11  SpO2 95% Physical Exam  Constitutional: She is oriented to person, place, and time. She appears well-developed and well-nourished.  Alert and in no acute distress  HENT:  Head: Normocephalic and atraumatic.  Cardiovascular: Normal rate, regular rhythm and normal heart sounds.  Exam reveals no gallop and no friction rub.   No murmur heard. Pulmonary/Chest: Effort normal and breath sounds normal. No respiratory distress. She has no wheezes. She has no rales. She exhibits no tenderness.  Abdominal: Soft. Bowel sounds are normal. She exhibits no distension and no mass. There is no tenderness. There is no rebound and no guarding.  Musculoskeletal: She exhibits no edema.  Neurological: She is alert and oriented to person, place, and time.  Skin: Skin is warm and dry.  Nursing note and vitals reviewed.   ED Course  Procedures (including critical care time) Labs Review Labs Reviewed  COMPREHENSIVE METABOLIC PANEL - Abnormal; Notable for the following:    Sodium 127 (*)    Chloride 90 (*)    Glucose, Bld 143 (*)    AST 47 (*)    All other components within normal limits  CBC - Abnormal; Notable for the following:    HCT 35.6 (*)    All other components within normal limits  URINALYSIS, ROUTINE W REFLEX MICROSCOPIC (NOT AT [facility]) - Abnormal; Notable for the following:    Ketones, ur 40 (*)    All other components within normal limits  LIPASE, BLOOD    Imaging Review No results found. I have personally reviewed and evaluated these images and lab results as part of my medical decision-making.   EKG Interpretation   Date/Time:  Wednesday June 09 2015 23:38:30 EDT Ventricular Rate:  72 PR Interval:  162 QRS Duration: 145 QT Interval:  451 QTC Calculation: 494 R Axis:   -35 Text Interpretation:  Sinus rhythm Left bundle branch block No significant  change since last tracing no scarbossa  Confirmed by Erroll Luna  [phone]) on 06/09/2015 11:42:53 PM      MDM   Final diagnoses:  Nausea without vomiting   Pamela Peterson presents with daughter for nausea since this morning. This appears to be a chronic problem. Daughter and patient agree this is similar to previous episodes. She is followed by GI and no etiology of nausea has been found. Initial evaluation was performed after 4 mg ODT zofran and patient states no improvement. On exam, patient with non-surgical abdomen. Soft, NT, ND.   Labs:CBC, lipase, and UA reassuring. CMP with hyponatremia of 127, chloride 90, AST 47, glucose 143.  EKG reviewed with no changes from previous.   Patient re-evaluated after 500 IVF bolus, protonix, and zofran. Sxs improved but patient still states she is nauseous and uncomfortable. Will give compazine.   12:41 AM - Patient re-evaluated and feels  improved. Still somewhat nauseous, but much better than on arrival. Patient and daughter at bedside feel safe with discharge to home. They have agreed to follow up with PCP tomorrow. Return precautions given and all questions answered.   Patient seen by and discussed with Dr. Clayborne Dana who agrees with treatment plan.   [facility] Airanna Partin, PA-C 06/10/15 1610  Marily Memos, MD 06/10/15 1047

## 2015-06-09 NOTE — ED Notes (Signed)
Pt here for nausea and dehydration since this am. Denies vomiting and diarrhea.

## 2015-06-10 MED ORDER — AMLODIPINE BESYLATE 5 MG PO TABS
10.0000 mg | ORAL_TABLET | Freq: Once | ORAL | Status: AC
  Administered 2015-06-10: 10 mg via ORAL
  Filled 2015-06-10: qty 2

## 2015-06-10 NOTE — ED Notes (Signed)
Patient verbalized understanding of discharge instructions and denies any further needs or questions at this time. VS stable. Patient ambulatory with steady gait.  

## 2015-06-10 NOTE — Discharge Instructions (Signed)
Please follow up with your primary care provider in the next 3 days.  Return to ER for new or worsening symptoms, any additional concerns.

## 2015-09-22 ENCOUNTER — Encounter (HOSPITAL_BASED_OUTPATIENT_CLINIC_OR_DEPARTMENT_OTHER): Payer: Self-pay

## 2015-09-22 ENCOUNTER — Observation Stay (HOSPITAL_BASED_OUTPATIENT_CLINIC_OR_DEPARTMENT_OTHER)
Admission: EM | Admit: 2015-09-22 | Discharge: 2015-09-24 | Disposition: A | Discharge status: Home / Self Care | Payer: Medicare HMO | Attending: Internal Medicine | Admitting: Internal Medicine

## 2015-09-22 DIAGNOSIS — Z79899 Other long term (current) drug therapy: Secondary | ICD-10-CM | POA: Diagnosis not present

## 2015-09-22 DIAGNOSIS — G2581 Restless legs syndrome: Secondary | ICD-10-CM | POA: Diagnosis not present

## 2015-09-22 DIAGNOSIS — R110 Nausea: Secondary | ICD-10-CM | POA: Diagnosis present

## 2015-09-22 DIAGNOSIS — M810 Age-related osteoporosis without current pathological fracture: Secondary | ICD-10-CM | POA: Diagnosis not present

## 2015-09-22 DIAGNOSIS — Z7982 Long term (current) use of aspirin: Secondary | ICD-10-CM | POA: Diagnosis not present

## 2015-09-22 DIAGNOSIS — I10 Essential (primary) hypertension: Secondary | ICD-10-CM | POA: Insufficient documentation

## 2015-09-22 DIAGNOSIS — E871 Hypo-osmolality and hyponatremia: Principal | ICD-10-CM | POA: Diagnosis present

## 2015-09-22 DIAGNOSIS — K219 Gastro-esophageal reflux disease without esophagitis: Secondary | ICD-10-CM | POA: Diagnosis present

## 2015-09-22 DIAGNOSIS — E039 Hypothyroidism, unspecified: Secondary | ICD-10-CM | POA: Insufficient documentation

## 2015-09-22 LAB — COMPREHENSIVE METABOLIC PANEL
ALBUMIN: 4.3 g/dL (ref 3.5–5.0)
ALT: 17 U/L (ref 14–54)
ANION GAP: 11 (ref 5–15)
AST: 41 U/L (ref 15–41)
Alkaline Phosphatase: 48 U/L (ref 38–126)
BILIRUBIN TOTAL: 1 mg/dL (ref 0.3–1.2)
BUN: 14 mg/dL (ref 6–20)
CO2: 23 mmol/L (ref 22–32)
Calcium: 8.5 mg/dL — ABNORMAL LOW (ref 8.9–10.3)
Chloride: 87 mmol/L — ABNORMAL LOW (ref 101–111)
Creatinine, Ser: 0.57 mg/dL (ref 0.44–1.00)
GFR calc Af Amer: >60 mL/min (ref >60)
GFR calc non Af Amer: >60 mL/min (ref >60)
GLUCOSE: 172 mg/dL — AB (ref 65–99)
POTASSIUM: 3.6 mmol/L (ref 3.5–5.1)
SODIUM: 121 mmol/L — AB (ref 135–145)
Total Protein: 6.7 g/dL (ref 6.5–8.1)

## 2015-09-22 LAB — URINALYSIS, ROUTINE W REFLEX MICROSCOPIC
Bilirubin Urine: NEGATIVE
GLUCOSE, UA: NEGATIVE mg/dL
Hgb urine dipstick: NEGATIVE
KETONES UR: 15 mg/dL — AB
LEUKOCYTES UA: NEGATIVE
Nitrite: NEGATIVE
PH: 7.5 (ref 5.0–8.0)
PROTEIN: NEGATIVE mg/dL
Specific Gravity, Urine: 1.01 (ref 1.005–1.030)

## 2015-09-22 LAB — CBC
HEMATOCRIT: 32.4 % — AB (ref 36.0–46.0)
HEMOGLOBIN: 11.1 g/dL — AB (ref 12.0–15.0)
MCH: 30.4 pg (ref 26.0–34.0)
MCHC: 34.3 g/dL (ref 30.0–36.0)
MCV: 88.8 fL (ref 78.0–100.0)
Platelets: 184 10*3/uL (ref 150–400)
RBC: 3.65 MIL/uL — AB (ref 3.87–5.11)
RDW: 12.8 % (ref 11.5–15.5)
WBC: 5.3 10*3/uL (ref 4.0–10.5)

## 2015-09-22 LAB — CBC WITH DIFFERENTIAL/PLATELET
BASOS ABS: 0 10*3/uL (ref 0.0–0.1)
Basophils Relative: 0 %
EOS PCT: 0 %
Eosinophils Absolute: 0 10*3/uL (ref 0.0–0.7)
HEMATOCRIT: 35.4 % — AB (ref 36.0–46.0)
Hemoglobin: 12.9 g/dL (ref 12.0–15.0)
LYMPHS ABS: 0.6 10*3/uL — AB (ref 0.7–4.0)
LYMPHS PCT: 10 %
MCH: 32.3 pg (ref 26.0–34.0)
MCHC: 36.4 g/dL — AB (ref 30.0–36.0)
MCV: 88.5 fL (ref 78.0–100.0)
MONO ABS: 0.4 10*3/uL (ref 0.1–1.0)
MONOS PCT: 7 %
NEUTROS ABS: 4.8 10*3/uL (ref 1.7–7.7)
Neutrophils Relative %: 83 %
PLATELETS: 224 10*3/uL (ref 150–400)
RBC: 4 MIL/uL (ref 3.87–5.11)
RDW: 12.3 % (ref 11.5–15.5)
WBC: 5.8 10*3/uL (ref 4.0–10.5)

## 2015-09-22 LAB — SODIUM, URINE, RANDOM: Sodium, Ur: 113 mmol/L

## 2015-09-22 LAB — GLUCOSE, CAPILLARY: Glucose-Capillary: 158 mg/dL — ABNORMAL HIGH (ref 65–99)

## 2015-09-22 MED ORDER — PANTOPRAZOLE SODIUM 40 MG PO TBEC
40.0000 mg | DELAYED_RELEASE_TABLET | Freq: Every day | ORAL | Status: DC
  Administered 2015-09-23 – 2015-09-24 (×2): 40 mg via ORAL
  Filled 2015-09-22 (×2): qty 1

## 2015-09-22 MED ORDER — ONDANSETRON HCL 4 MG/2ML IJ SOLN: 4.0000 mg | Freq: Three times a day (TID) | INTRAMUSCULAR | Status: DC | PRN

## 2015-09-22 MED ORDER — LEVOTHYROXINE SODIUM 50 MCG PO TABS
50.0000 ug | ORAL_TABLET | Freq: Every day | ORAL | Status: DC
  Administered 2015-09-23 – 2015-09-24 (×2): 50 ug via ORAL
  Filled 2015-09-22 (×2): qty 1

## 2015-09-22 MED ORDER — TRAZODONE HCL 50 MG PO TABS: 50.0000 mg | ORAL_TABLET | Freq: Every evening | ORAL | Status: DC | PRN

## 2015-09-22 MED ORDER — ENOXAPARIN SODIUM 30 MG/0.3ML ~~LOC~~ SOLN
20.0000 mg | SUBCUTANEOUS | Status: DC
  Administered 2015-09-23: 20 mg via SUBCUTANEOUS
  Filled 2015-09-22: qty 0.3
  Filled 2015-09-22 (×2): qty 0.2

## 2015-09-22 MED ORDER — ASPIRIN EC 81 MG PO TBEC
81.0000 mg | DELAYED_RELEASE_TABLET | Freq: Every day | ORAL | Status: DC
  Administered 2015-09-23 – 2015-09-24 (×2): 81 mg via ORAL
  Filled 2015-09-22 (×2): qty 1

## 2015-09-22 MED ORDER — PROCHLORPERAZINE MALEATE 10 MG PO TABS
10.0000 mg | ORAL_TABLET | Freq: Four times a day (QID) | ORAL | Status: DC | PRN
  Filled 2015-09-22: qty 1

## 2015-09-22 MED ORDER — LOSARTAN POTASSIUM 50 MG PO TABS
100.0000 mg | ORAL_TABLET | Freq: Every day | ORAL | Status: DC
  Administered 2015-09-23 – 2015-09-24 (×2): 100 mg via ORAL
  Filled 2015-09-22 (×2): qty 2

## 2015-09-22 MED ORDER — POTASSIUM CHLORIDE IN NACL 20-0.9 MEQ/L-% IV SOLN
INTRAVENOUS | Status: AC
Start: 2015-09-22 — End: 2015-09-23
  Administered 2015-09-23 (×2): via INTRAVENOUS
  Filled 2015-09-22 (×2): qty 1000

## 2015-09-22 MED ORDER — ONDANSETRON HCL 4 MG/2ML IJ SOLN
4.0000 mg | Freq: Once | INTRAMUSCULAR | Status: AC
  Administered 2015-09-22: 4 mg via INTRAVENOUS
  Filled 2015-09-22: qty 2

## 2015-09-22 MED ORDER — PREGABALIN 100 MG PO CAPS
200.0000 mg | ORAL_CAPSULE | Freq: Every day | ORAL | Status: DC
  Administered 2015-09-23 (×2): 200 mg via ORAL
  Filled 2015-09-22 (×3): qty 2

## 2015-09-22 MED ORDER — PREGABALIN 100 MG PO CAPS: 100.0000 mg | ORAL_CAPSULE | Freq: Two times a day (BID) | ORAL | Status: DC

## 2015-09-22 MED ORDER — AMLODIPINE BESYLATE 10 MG PO TABS
10.0000 mg | ORAL_TABLET | Freq: Every day | ORAL | Status: DC
  Administered 2015-09-23 (×2): 10 mg via ORAL
  Filled 2015-09-22 (×2): qty 1

## 2015-09-22 MED ORDER — FAMOTIDINE IN NACL 20-0.9 MG/50ML-% IV SOLN
20.0000 mg | Freq: Once | INTRAVENOUS | Status: AC
  Administered 2015-09-22: 20 mg via INTRAVENOUS
  Filled 2015-09-22: qty 50

## 2015-09-22 MED ORDER — SODIUM CHLORIDE 0.9 % IV BOLUS (SEPSIS)
500.0000 mL | Freq: Once | INTRAVENOUS | Status: AC
  Administered 2015-09-22: 500 mL via INTRAVENOUS

## 2015-09-22 MED ORDER — MIRABEGRON ER 25 MG PO TB24
25.0000 mg | ORAL_TABLET | Freq: Every day | ORAL | Status: DC
  Administered 2015-09-23 – 2015-09-24 (×2): 25 mg via ORAL
  Filled 2015-09-22 (×2): qty 1

## 2015-09-22 MED ORDER — PREGABALIN 100 MG PO CAPS
100.0000 mg | ORAL_CAPSULE | Freq: Every day | ORAL | Status: DC
  Administered 2015-09-23 – 2015-09-24 (×2): 100 mg via ORAL
  Filled 2015-09-22 (×2): qty 1

## 2015-09-22 NOTE — Progress Notes (Signed)
Patient ID: Syrita Radell, female   DOB: [birth year], 82 y.o.   MRN: [id number] Accepted from [facility] to telemetry bed/observation at [facility] for hyponatremia workup and treatment.   Please call the floor manager at extension [phone] for admitting physician assignment once the patient arrives to the unit.  Per Dr. Rolan Bucco Patient presents with  . Nausea    HPI Ghada Blanche is a 82 y.o. female.  Pt presents with nausea. She's a 82 year old female who is been diagnosed in the past with esophageal dysmotility syndrome. She has recurrent bouts of nausea and decreased appetite. She presents today with similar symptoms. She takes Compazine at home for nausea. She took some earlier today without improvement in symptoms. Her daughter who is with her states that she tends to get very anxious when she's having any symptoms. Patient denies abdominal pain. No vomiting but she has had some dry heaves. No fevers. No urinary symptoms. She states that these are the same symptoms she's had previously.   Component Value Units  Comprehensive metabolic panel [[id number]] (Abnormal) Collected: 09/22/15 1720  Updated: 09/22/15 1755   Specimen Type: Blood   Specimen Source: Vein    Sodium 121 (L) mmol/L   Potassium 3.6 mmol/L   Chloride 87 (L) mmol/L   CO2 23 mmol/L   Glucose, Bld 172 (H) mg/dL   BUN 14 mg/dL   Creatinine, Ser 2.03 mg/dL   Calcium 8.5 (L) mg/dL   Total Protein 6.7 g/dL   Albumin 4.3 g/dL   AST 41 U/L   ALT 17 U/L   Alkaline Phosphatase 48 U/L   Total Bilirubin 1.0 mg/dL   GFR calc non Af Amer >60 mL/min   GFR calc Af Amer >60 mL/min   Anion gap 11  CBC with Differential [[id number]] (Abnormal) Collected: 09/22/15 1720  Updated: 09/22/15 1737   Specimen Type: Blood   Specimen Source: Vein    WBC 5.8 K/uL   RBC 4.00 MIL/uL   Hemoglobin 12.9 g/dL   HCT 45.3 (L) %   MCV 88.5 fL   MCH 32.3 pg   MCHC 36.4 (H) g/dL   RDW 64.6 %   Platelets 224 K/uL   Neutrophils  Relative % 83 %   Neutro Abs 4.8 K/uL   Lymphocytes Relative 10 %   Lymphs Abs 0.6 (L) K/uL   Monocytes Relative 7 %   Monocytes Absolute 0.4 K/uL   Eosinophils Relative 0 %   Eosinophils Absolute 0.0 K/uL   Basophils Relative 0 %   Basophils Absolute 0.0 K/uL  EKG 09/22/2015  Vent. rate 84 BPM PR interval * ms P-R-T axes 65 14 77 QRS duration 144 ms QT/QTc 465/550 ms Sinus rhythm Left bundle branch block Worsening ST depression laterally, when compared to previous EKG  Sanda Klein, M.D. [phone].

## 2015-09-22 NOTE — ED Notes (Signed)
Paged via [facility] @ 6:43 pm

## 2015-09-22 NOTE — ED Provider Notes (Signed)
[facility] Provider Note   CSN: [id number] Arrival date & time: 09/22/15  1659  First Provider Contact:  First MD Initiated Contact with Patient 09/22/15 1728        History   Chief Complaint Chief Complaint  Patient presents with  . Nausea    HPI Pamela Peterson is a 82 y.o. female.  Pt presents with nausea. She's a 82 year old female who is been diagnosed in the past with esophageal dysmotility syndrome. She has recurrent bouts of nausea and decreased appetite. She presents today with similar symptoms. She takes Compazine at home for nausea. She took some earlier today without improvement in symptoms. Her daughter who is with her states that she tends to get very anxious when she's having any symptoms. Patient denies abdominal pain. No vomiting but she has had some dry heaves. No fevers. No urinary symptoms. She states that these are the same symptoms she's had previously.      Past Medical History:  Diagnosis Date  . Hyperlipidemia   . Hypertension   . Hypothyroid   . Osteoporosis     Patient Active Problem List   Diagnosis Date Noted  . Hyponatremia 09/22/2015  . Dysphagia   . Altered mental status 03/01/2015  . Restless legs 03/01/2015  . Hypokalemia 03/01/2015  . Failure to thrive in adult 01/23/2015  . Hypothyroidism 01/23/2015  . Diarrhea 01/23/2015  . Nausea without vomiting 01/23/2015  . Essential hypertension 01/23/2015  . GERD (gastroesophageal reflux disease) 01/23/2015    Past Surgical History:  Procedure Laterality Date  . BLADDER SURGERY      OB History    No data available       Home Medications    Prior to Admission medications   Medication Sig Start Date End Date Taking? Authorizing Provider  MIRTAZAPINE PO Take by mouth.   Yes Historical Provider, MD  prochlorperazine (COMPAZINE) 10 MG tablet Take 10 mg by mouth every 6 (six) hours as needed for nausea or vomiting.   Yes Historical Provider, MD  amLODipine (NORVASC) 10 MG  tablet Take 10 mg by mouth at bedtime.    Historical Provider, MD  aspirin EC 81 MG tablet Take 81 mg by mouth daily.    Historical Provider, MD  CALCIUM-VITAMIN D PO Take 1 tablet by mouth 2 (two) times daily. Calcium 600- D3- 2000    Historical Provider, MD  esomeprazole (NEXIUM) 40 MG capsule Take 40 mg by mouth daily at 12 noon.    Historical Provider, MD  levothyroxine (SYNTHROID, LEVOTHROID) 50 MCG tablet Take 50 mcg by mouth daily before breakfast.    Historical Provider, MD  losartan (COZAAR) 100 MG tablet Take 100 mg by mouth daily.    Historical Provider, MD  mirabegron ER (MYRBETRIQ) 25 MG TB24 tablet Take 25 mg by mouth daily.     Historical Provider, MD  Multiple Vitamins-Minerals (MULTIVITAMIN & MINERAL PO) Take 1 tablet by mouth daily.    Historical Provider, MD  ondansetron (ZOFRAN ODT) 4 MG disintegrating tablet Take 1 tablet (4 mg total) by mouth every 8 (eight) hours as needed for nausea or vomiting. 03/02/15   Zannie Cove, MD  pregabalin (LYRICA) 100 MG capsule Take 100-200 mg by mouth 2 (two) times daily. TAKES 1 CAP IN AM AND 2 CAPS IN PM    Historical Provider, MD  promethazine (PHENERGAN) 12.5 MG tablet Take 1 tablet (12.5 mg total) by mouth every 6 (six) hours as needed for refractory nausea / vomiting. 03/02/15  Zannie Cove, MD  traZODone (DESYREL) 50 MG tablet Take 50 mg by mouth at bedtime as needed for sleep.    Historical Provider, MD    Family History Family History  Problem Relation Age of Onset  . Heart attack Brother   . COPD Sister   . Cancer Brother   . Tuberculosis Mother   . Heart attack Father   . Alcohol abuse Father     Social History Social History  Substance Use Topics  . Smoking status: Never Smoker  . Smokeless tobacco: Never Used  . Alcohol use No     Allergies   Penicillins and Lorazepam   Review of Systems Review of Systems  Constitutional: Negative for chills, diaphoresis, fatigue and fever.  HENT: Negative for congestion,  rhinorrhea and sneezing.   Eyes: Negative.   Respiratory: Negative for cough, chest tightness and shortness of breath.   Cardiovascular: Negative for chest pain and leg swelling.  Gastrointestinal: Positive for nausea. Negative for abdominal pain, blood in stool, diarrhea and vomiting.  Genitourinary: Negative for difficulty urinating, flank pain, frequency and hematuria.  Musculoskeletal: Negative for arthralgias and back pain.  Skin: Negative for rash.  Neurological: Negative for dizziness, speech difficulty, weakness, numbness and headaches.     Physical Exam Updated Vital Signs BP 153/66 (BP Location: Left Arm)   Pulse 82   Temp 98.6 F (37 C) (Oral)   Resp 20   Ht  (1.295 m)   Wt 87 lb (39.5 kg)   SpO2 95%   BMI 23.52 kg/m   Physical Exam  Constitutional: She is oriented to person, place, and time. She appears well-developed and well-nourished.  HENT:  Head: Normocephalic and atraumatic.  Eyes: Pupils are equal, round, and reactive to light.  Neck: Normal range of motion. Neck supple.  Cardiovascular: Normal rate, regular rhythm and normal heart sounds.   Pulmonary/Chest: Effort normal and breath sounds normal. No respiratory distress. She has no wheezes. She has no rales. She exhibits no tenderness.  Abdominal: Soft. Bowel sounds are normal. There is no tenderness. There is no rebound and no guarding.  Musculoskeletal: Normal range of motion. She exhibits no edema.  Lymphadenopathy:    She has no cervical adenopathy.  Neurological: She is alert and oriented to person, place, and time.  Skin: Skin is warm and dry. No rash noted.  Psychiatric: She has a normal mood and affect.     ED Treatments / Results  Labs (all labs ordered are listed, but only abnormal results are displayed) Labs Reviewed  COMPREHENSIVE METABOLIC PANEL - Abnormal; Notable for the following:       Result Value   Sodium 121 (*)    Chloride 87 (*)    Glucose, Bld 172 (*)    Calcium 8.5  (*)    All other components within normal limits  CBC WITH DIFFERENTIAL/PLATELET - Abnormal; Notable for the following:    HCT 35.4 (*)    MCHC 36.4 (*)    Lymphs Abs 0.6 (*)    All other components within normal limits  URINALYSIS, ROUTINE W REFLEX MICROSCOPIC (NOT AT [facility])    EKG  EKG Interpretation  Date/Time:  Wednesday September 22 2015 18:46:49 EDT Ventricular Rate:  84 PR Interval:    QRS Duration: 144 QT Interval:  465 QTC Calculation: 550 R Axis:   14 Text Interpretation:  Sinus rhythm Left bundle branch block slight ST depression laterally, otherwise, unchanged from prior EKG Confirmed by Liany Mumpower  MD, Bernis Stecher ([id number])  on 09/22/2015 6:51:31 PM       Radiology No results found.  Procedures Procedures (including critical care time)  Medications Ordered in ED Medications  sodium chloride 0.9 % bolus 500 mL (0 mLs Intravenous Stopped 09/22/15 1852)  ondansetron (ZOFRAN) injection 4 mg (4 mg Intravenous Given 09/22/15 1740)  famotidine (PEPCID) IVPB 20 mg premix (0 mg Intravenous Stopped 09/22/15 1815)     Initial Impression / Assessment and Plan / ED Course  I have reviewed the triage vital signs and the nursing notes.  Pertinent labs & imaging results that were available during my care of the patient were reviewed by me and considered in my medical decision making (see chart for details).  Clinical Course    Patient presents with nausea and dry heaves. She's had similar symptoms in the past with esophageal dysmotility syndrome. She has no abdominal pain on exam. She was given Zofran IV fluids and Pepcid. She's feeling a little bit better but still nauseated. Her sodium is markedly low at 121. Her prior value in April was 127 and previously to that it was in the 130s. Given that it's dropped down to 121 today I feel that she needs to be admitted for observation and treatment. I will consult the hospitalist at [facility].  I spoke with Dr. Robb Matar who has accepted pt for  transfer to [facility].  Final Clinical Impressions(s) / ED Diagnoses   Final diagnoses:  Hyponatremia  Nausea    New Prescriptions New Prescriptions   No medications on file     Rolan Bucco, MD 09/22/15 1911

## 2015-09-22 NOTE — ED Notes (Signed)
MD at bedside. 

## 2015-09-22 NOTE — ED Triage Notes (Signed)
Nausea since last night-NAD-presents to triage in w/c-daughter with pt

## 2015-09-22 NOTE — ED Notes (Signed)
Nausea onset last p.m. EMS came out this a.m. Nausea continued, so brought to ED. Took Compazine at home.

## 2015-09-22 NOTE — ED Notes (Signed)
Pt transported to [place] via [facility] 

## 2015-09-22 NOTE — H&P (Signed)
History and Physical    Pamela Peterson HYW:[id number] DOB: [birth year] DOA: 09/22/2015  PCP: Leola Brazil, DO   Patient coming from: Home.  Chief Complaint: Dry heaves.  HPI: Pamela Peterson is a 82 y.o. female with medical history significant of hyperlipidemia, hypertension, hypothyroidism, osteoporosis who presented to [facility] due to persistent dry heaves since yesterday.  The patient has been worked up in the past by GI and has a history of esophageal dysmotility disorder. Her daughter states that she gets these episodes of persistent nausea without vomiting every few months. She denies abdominal pain, diarrhea, constipation, melena or hematochezia. She denies headache, fever, chills, chest pain, dyspnea, palpitations, cough, dysuria or hematuria.  ED Course: The patient received IV fluids, IV famotidine and antiemetics reporting relief. Workup was significant for a sodium level of 121 mmol per liter and hyperglycemia 171 mg/dL.  Review of Systems: As per HPI otherwise 10 point review of systems negative.  Past Medical History:  Diagnosis Date  . Hyperlipidemia   . Hypertension   . Hypothyroid   . Osteoporosis     Past Surgical History:  Procedure Laterality Date  . BLADDER SURGERY       reports that she has never smoked. She has never used smokeless tobacco. She reports that she does not drink alcohol or use drugs.  Allergies  Allergen Reactions  . Penicillins Hives and Swelling    Has patient had a PCN reaction causing immediate rash, facial/tongue/throat swelling, SOB or lightheadedness with hypotension: no Has patient had a PCN reaction causing severe rash involving mucus membranes or skin necrosis: no Has patient had a PCN reaction that required hospitalization already in hospital  Has patient had a PCN reaction occurring within the last 10 years: no If all of the above answers are "NO", then may proceed with Cephalosporin use.   . Lorazepam Other (See Comments)   Restless legs    Family History  Problem Relation Age of Onset  . Heart attack Brother   . COPD Sister   . Cancer Brother   . Tuberculosis Mother   . Heart attack Father   . Alcohol abuse Father      Prior to Admission medications   Medication Sig Start Date End Date Taking? Authorizing Provider  MIRTAZAPINE PO Take by mouth.   Yes Historical Provider, MD  prochlorperazine (COMPAZINE) 10 MG tablet Take 10 mg by mouth every 6 (six) hours as needed for nausea or vomiting.   Yes Historical Provider, MD  amLODipine (NORVASC) 10 MG tablet Take 10 mg by mouth at bedtime.    Historical Provider, MD  aspirin EC 81 MG tablet Take 81 mg by mouth daily.    Historical Provider, MD  CALCIUM-VITAMIN D PO Take 1 tablet by mouth 2 (two) times daily. Calcium 600- D3- 2000    Historical Provider, MD  esomeprazole (NEXIUM) 40 MG capsule Take 40 mg by mouth daily at 12 noon.    Historical Provider, MD  levothyroxine (SYNTHROID, LEVOTHROID) 50 MCG tablet Take 50 mcg by mouth daily before breakfast.    Historical Provider, MD  losartan (COZAAR) 100 MG tablet Take 100 mg by mouth daily.    Historical Provider, MD  mirabegron ER (MYRBETRIQ) 25 MG TB24 tablet Take 25 mg by mouth daily.     Historical Provider, MD  Multiple Vitamins-Minerals (MULTIVITAMIN & MINERAL PO) Take 1 tablet by mouth daily.    Historical Provider, MD  ondansetron (ZOFRAN ODT) 4 MG disintegrating tablet Take 1 tablet (4 mg  total) by mouth every 8 (eight) hours as needed for nausea or vomiting. 03/02/15   Zannie Cove, MD  pregabalin (LYRICA) 100 MG capsule Take 100-200 mg by mouth 2 (two) times daily. TAKES 1 CAP IN AM AND 2 CAPS IN PM    Historical Provider, MD  promethazine (PHENERGAN) 12.5 MG tablet Take 1 tablet (12.5 mg total) by mouth every 6 (six) hours as needed for refractory nausea / vomiting. 03/02/15   Zannie Cove, MD  traZODone (DESYREL) 50 MG tablet Take 50 mg by mouth at bedtime as needed for sleep.    Historical Provider,  MD    Physical Exam: Vitals:   09/22/15 1707 09/22/15 1850 09/22/15 2000 09/22/15 2115  BP: 155/81 153/66 155/71 (!) 141/77  Pulse: 87 82 83 76  Resp: Temp: 98 F (36.7 C) 98.6 F (37 C)  98.4 F (36.9 C)  TempSrc: Oral Oral  Oral  SpO2: 98% 95% 97% 95%  Weight: 39.5 kg (87 lb)   43.4 kg (95 lb 9.6 oz)  Height:  (1.295 m)         Constitutional: NAD, Frail elderly female. Vitals:   09/22/15 1707 09/22/15 1850 09/22/15 2000 09/22/15 2115  BP: 155/81 153/66 155/71 (!) 141/77  Pulse: 87 82 83 76  Resp: Temp: 98 F (36.7 C) 98.6 F (37 C)  98.4 F (36.9 C)  TempSrc: Oral Oral  Oral  SpO2: 98% 95% 97% 95%  Weight: 39.5 kg (87 lb)   43.4 kg (95 lb 9.6 oz)  Height:  (1.295 m)      Eyes: PERRL, lids and conjunctivae normal ENMT: Mucous membranes are moist. Posterior pharynx clear of any exudate or lesions. Neck: normal, supple, no masses, no thyromegaly Respiratory: clear to auscultation bilaterally, no wheezing, no crackles. Normal respiratory effort. No accessory muscle use.  Cardiovascular: Regular rate and rhythm, no murmurs / rubs / gallops. No extremity edema. 2+ pedal pulses. No carotid bruits.  Abdomen: no tenderness, no masses palpated. No hepatosplenomegaly. Bowel sounds positive.  Musculoskeletal: no clubbing / cyanosis. Good ROM, no contractures. Normal muscle tone.  Skin: no rashes, lesions, ulcers on limited skin exam Neurologic: CN 2-12 grossly intact. Sensation intact, DTR normal. Strength 5/5 in all 4.  Psychiatric: Normal judgment and insight. Alert and oriented x 3. Normal mood.    Labs on Admission: I have personally reviewed following labs and imaging studies  CBC:  Recent Labs Lab 09/22/15 1720  WBC 5.8  NEUTROABS 4.8  HGB 12.9  HCT 35.4*  MCV 88.5  PLT 224   Basic Metabolic Panel:  Recent Labs Lab 09/22/15 1720  NA 121*  K 3.6  CL 87*  CO2 23  GLUCOSE 172*  BUN 14  CREATININE 0.57  CALCIUM  8.5*   GFR: Estimated Creatinine Clearance: 22.3 mL/min (by C-G formula based on SCr of 0.8 mg/dL). Liver Function Tests:  Recent Labs Lab 09/22/15 1720  AST 41  ALT 17  ALKPHOS 48  BILITOT 1.0  PROT 6.7  ALBUMIN 4.3   CBG:  Recent Labs Lab 09/22/15 2106  GLUCAP 158*   Urine analysis:    Component Value Date/Time   COLORURINE YELLOW 09/22/2015 1945   APPEARANCEUR CLEAR 09/22/2015 1945   LABSPEC 1.010 09/22/2015 1945   PHURINE 7.5 09/22/2015 1945   GLUCOSEU NEGATIVE 09/22/2015 1945   HGBUR NEGATIVE 09/22/2015 1945   BILIRUBINUR NEGATIVE 09/22/2015 1945   KETONESUR 15 (A) 09/22/2015 1945  PROTEINUR NEGATIVE 09/22/2015 1945   UROBILINOGEN 0.2 04/27/2014 2329   NITRITE NEGATIVE 09/22/2015 1945   LEUKOCYTESUR NEGATIVE 09/22/2015 1945    EKG: Independently reviewed.  Vent. rate 84 BPM PR interval * ms P-R-T axes 65 14 77 QRS duration 144 ms QT/QTc 465/550 ms Sinus rhythm Left bundle branch block Worsening ST depression laterally, when compared to previous EKG  Assessment/Plan Principal Problem:   Hyponatremia This could be as a result of decreased oral intake and sometimes can happen due to losartan. Admit to telemetry/observation. Continue gentle normal saline infusion. Check urine sodium. Check urine osmolality. Check serum osmolality and ACTH in the morning. The patient is on a high dose of losartan for her weight.  Active Problems:   Hypothyroidism Continue levothyroxine. Check TSH level in the morning.    Nausea without vomiting This is chronic and recurrent. The patient was worked up by GI several years ago. Continue antiemetics.    Essential hypertension Continue losartan 100 mg by mouth daily. Amlodipine 10 mg by mouth daily. Monitor blood pressure.    GERD (gastroesophageal reflux disease) Currently taking Nexium 40 mg by mouth daily at home. Protonix 40 mg by mouth daily while in the hospital.    DVT prophylaxis: Lovenox Code  Status: Full code. Family Communication: Her daughter Olegario Messier was present in the room and provided history.  Disposition Plan: Admit for hyponatremia workup and treatment. Consults called:  Admission status: Observation/telemetry   Bobette Mo MD [facility] Pager [phone].  If 7PM-7AM, please contact night-coverage [email or web address] Password [facility]  09/22/2015, 10:47 PM

## 2015-09-23 DIAGNOSIS — E871 Hypo-osmolality and hyponatremia: Secondary | ICD-10-CM | POA: Diagnosis not present

## 2015-09-23 LAB — BASIC METABOLIC PANEL
ANION GAP: 4 — AB (ref 5–15)
BUN: 9 mg/dL (ref 6–20)
CO2: 25 mmol/L (ref 22–32)
Calcium: 7.5 mg/dL — ABNORMAL LOW (ref 8.9–10.3)
Chloride: 96 mmol/L — ABNORMAL LOW (ref 101–111)
Creatinine, Ser: 0.51 mg/dL (ref 0.44–1.00)
GFR calc Af Amer: >60 mL/min (ref >60)
GFR calc non Af Amer: >60 mL/min (ref >60)
GLUCOSE: 103 mg/dL — AB (ref 65–99)
POTASSIUM: 3.7 mmol/L (ref 3.5–5.1)
Sodium: 125 mmol/L — ABNORMAL LOW (ref 135–145)

## 2015-09-23 LAB — CREATININE, SERUM
Creatinine, Ser: 0.51 mg/dL (ref 0.44–1.00)
GFR calc Af Amer: >60 mL/min (ref >60)

## 2015-09-23 LAB — OSMOLALITY: Osmolality: 257 mOsm/kg — ABNORMAL LOW (ref 275–295)

## 2015-09-23 LAB — CBC
HEMATOCRIT: 27.9 % — AB (ref 36.0–46.0)
Hemoglobin: 10.1 g/dL — ABNORMAL LOW (ref 12.0–15.0)
MCH: 31.7 pg (ref 26.0–34.0)
MCHC: 36.2 g/dL — ABNORMAL HIGH (ref 30.0–36.0)
MCV: 87.5 fL (ref 78.0–100.0)
Platelets: 190 10*3/uL (ref 150–400)
RBC: 3.19 MIL/uL — AB (ref 3.87–5.11)
RDW: 13.1 % (ref 11.5–15.5)
WBC: 4.4 10*3/uL (ref 4.0–10.5)

## 2015-09-23 LAB — TSH: TSH: 2.172 u[IU]/mL (ref 0.350–4.500)

## 2015-09-23 LAB — OSMOLALITY, URINE: Osmolality, Ur: 428 mOsm/kg (ref 300–900)

## 2015-09-23 NOTE — Progress Notes (Signed)
Patient ID: Pamela Peterson, female   DOB: Jul 05, 1919, 82 y.o.   MRN: [id number]   PROGRESS NOTE    Pamela Peterson  WUJ:[id number] DOB: 05-29-1919 DOA: 09/22/2015  PCP: Leola Brazil, DO   Brief Narrative:  82 y.o. female with medical history significant of hyperlipidemia, hypertension, hypothyroidism, osteoporosis who presented to [facility] due to persistent dry heaves and was found to have Na level 121.   The patient has been worked up in the past by GI and has a history of esophageal dysmotility disorder. Her daughter states that she gets these episodes of persistent nausea without vomiting every few months. She denies abdominal pain, diarrhea, constipation, melena or hematochezia. She denies headache, fever, chills, chest pain, dyspnea, palpitations, cough, dysuria or hematuria.  Assessment & Plan:   Principal Problem:   Hyponatremia - given hypoosmolality Osm = 257, this is classified as hypoosmolal hyponatremia - differential is extensive but we can safely exclude CHF, cirrhosis as no clinical evidence of volume overload  - since pt still looks dry on exam and Na has improved with IVF, suspect that pre renal etiology, dehydration, poor oral intake is the main cause - would liberalize diet and continue with IVF - repeat BMP in AM and if Na better, can likely be discharged home  - please note that TSH is WNL and pt already on synthroid so this is also unlikely cause of hyponatremia   Active Problems:   Hypothyroidism - TSH is stable and WNL - continue synthroid     Nausea without vomiting - better - allow antiemetics as needed     Essential hypertension - reasonable inpatient control     GERD (gastroesophageal reflux disease) - continue PPI   DVT prophylaxis: Lovenox SQ Code Status: Full  Family Communication: Patient at bedside  Disposition Plan:   Consultants:   None  Procedures:   None  Antimicrobials:   None   Subjective: No events overnight.    Objective: Vitals:   09/22/15 2000 09/22/15 2115 09/23/15 0502 09/23/15 1315  BP: 155/71 (!) 141/77 128/64 121/68  Pulse: 83 76 88 80  Resp: Temp:  98.4 F (36.9 C) 98.2 F (36.8 C) 98.7 F (37.1 C)  TempSrc:  Oral Oral Oral  SpO2: 97% 95% 95% 96%  Weight:  43.4 kg (95 lb 9.6 oz) 40.1 kg (88 lb 8 oz)   Height:        Intake/Output Summary (Last 24 hours) at 09/23/15 1947 Last data filed at 09/23/15 1700  Gross per 24 hour  Intake              720 ml  Output                0 ml  Net              720 ml   Filed Weights   09/22/15 1707 09/22/15 2115 09/23/15 0502  Weight: 39.5 kg (87 lb) 43.4 kg (95 lb 9.6 oz) 40.1 kg (88 lb 8 oz)    Examination:  General exam: Appears calm and comfortable  Respiratory system: Clear to auscultation. Respiratory effort normal. Cardiovascular system: S1 & S2 heard, RRR. No JVD, rubs, gallops or clicks. No pedal edema. Gastrointestinal system: Abdomen is nondistended, soft and nontender. No organomegaly or masses felt. Normal bowel sounds heard. Central nervous system: Alert and oriented. No focal neurological deficits.  Data Reviewed: I have personally reviewed following labs and imaging studies  CBC:  Recent Labs  Lab 09/22/15 1720 09/22/15 2337 09/23/15 0702  WBC 5.8 5.3 4.4  NEUTROABS 4.8  --   --   HGB 12.9 11.1* 10.1*  HCT 35.4* 32.4* 27.9*  MCV 88.5 88.8 87.5  PLT 224 184 190   Basic Metabolic Panel:  Recent Labs Lab 09/22/15 1720 09/22/15 2337 09/23/15 0702  NA 121*  --  125*  K 3.6  --  3.7  CL 87*  --  96*  CO2 23  --  25  GLUCOSE 172*  --  103*  BUN 14  --  9  CREATININE 0.57 0.51 0.51  CALCIUM 8.5*  --  7.5*   Liver Function Tests:  Recent Labs Lab 09/22/15 1720  AST 41  ALT 17  ALKPHOS 48  BILITOT 1.0  PROT 6.7  ALBUMIN 4.3   CBG:  Recent Labs Lab 09/22/15 2106  GLUCAP 158*   Thyroid Function Tests:  Recent Labs  09/23/15 0702  TSH 2.172   Urine analysis:     Component Value Date/Time   COLORURINE YELLOW 09/22/2015 1945   APPEARANCEUR CLEAR 09/22/2015 1945   LABSPEC 1.010 09/22/2015 1945   PHURINE 7.5 09/22/2015 1945   GLUCOSEU NEGATIVE 09/22/2015 1945   HGBUR NEGATIVE 09/22/2015 1945   BILIRUBINUR NEGATIVE 09/22/2015 1945   KETONESUR 15 (A) 09/22/2015 1945   PROTEINUR NEGATIVE 09/22/2015 1945   UROBILINOGEN 0.2 04/27/2014 2329   NITRITE NEGATIVE 09/22/2015 1945   LEUKOCYTESUR NEGATIVE 09/22/2015 1945    Radiology Studies: No results found.    Scheduled Meds: . amLODipine  10 mg Oral QHS  . aspirin EC  81 mg Oral Daily  . enoxaparin (LOVENOX) injection  20 mg Subcutaneous Q24H  . levothyroxine  50 mcg Oral QAC breakfast  . losartan  100 mg Oral Daily  . mirabegron ER  25 mg Oral Daily  . pantoprazole  40 mg Oral Daily  . pregabalin  100 mg Oral Daily  . pregabalin  200 mg Oral QHS   Continuous Infusions: . 0.9 % NaCl with KCl 20 mEq / L 75 mL/hr at 09/23/15 1311     LOS: 0 days   Time spent: 20 minutes   Debbora Presto, MD [facility] Pager [phone]  If 7PM-7AM, please contact night-coverage [email or web address] Password [facility] 09/23/2015, 7:47 PM

## 2015-09-23 NOTE — Care Management Obs Status (Signed)
MEDICARE OBSERVATION STATUS NOTIFICATION   Patient Details  Name: Pamela Peterson MRN: [id number] Date of Birth: [birth year]   Medicare Observation Status Notification Given:  Yes    Darrold Span, RN 09/23/2015, 12:05 PM

## 2015-09-24 DIAGNOSIS — I10 Essential (primary) hypertension: Secondary | ICD-10-CM

## 2015-09-24 DIAGNOSIS — R110 Nausea: Secondary | ICD-10-CM | POA: Diagnosis not present

## 2015-09-24 DIAGNOSIS — E038 Other specified hypothyroidism: Secondary | ICD-10-CM

## 2015-09-24 DIAGNOSIS — E871 Hypo-osmolality and hyponatremia: Secondary | ICD-10-CM

## 2015-09-24 LAB — CBC
HCT: 30.5 % — ABNORMAL LOW (ref 36.0–46.0)
HEMOGLOBIN: 10.8 g/dL — AB (ref 12.0–15.0)
MCH: 31.6 pg (ref 26.0–34.0)
MCHC: 35.4 g/dL (ref 30.0–36.0)
MCV: 89.2 fL (ref 78.0–100.0)
PLATELETS: 199 10*3/uL (ref 150–400)
RBC: 3.42 MIL/uL — AB (ref 3.87–5.11)
RDW: 13.4 % (ref 11.5–15.5)
WBC: 4.9 10*3/uL (ref 4.0–10.5)

## 2015-09-24 LAB — BASIC METABOLIC PANEL
ANION GAP: 6 (ref 5–15)
BUN: 11 mg/dL (ref 6–20)
CHLORIDE: 102 mmol/L (ref 101–111)
CO2: 25 mmol/L (ref 22–32)
Calcium: 8 mg/dL — ABNORMAL LOW (ref 8.9–10.3)
Creatinine, Ser: 0.57 mg/dL (ref 0.44–1.00)
GFR calc Af Amer: >60 mL/min (ref >60)
GLUCOSE: 101 mg/dL — AB (ref 65–99)
POTASSIUM: 3.8 mmol/L (ref 3.5–5.1)
Sodium: 133 mmol/L — ABNORMAL LOW (ref 135–145)

## 2015-09-24 NOTE — Discharge Summary (Signed)
Physician Discharge Summary  Pamela Peterson LTR:[id number] DOB: 01/10/1920 DOA: 09/22/2015  PCP: Hope Pigeon B, DO  Admit date: 09/22/2015 Discharge date: 09/24/2015  Time spent: >35 minutes  Recommendations for Outpatient Follow-up:  F/u with PCP in 3-7 days as needed  Discharge Diagnoses:  Principal Problem:   Hyponatremia Active Problems:   Hypothyroidism   Nausea without vomiting   Essential hypertension   GERD (gastroesophageal reflux disease)   Discharge Condition: stable   Diet recommendation: regular   Filed Weights   09/22/15 1707 09/22/15 2115 09/23/15 0502  Weight: 39.5 kg (87 lb) 43.4 kg (95 lb 9.6 oz) 40.1 kg (88 lb 8 oz)    History of present illness:  82 y.o.femalewith medical history significant of hyperlipidemia, hypertension, hypothyroidism, osteoporosis who presented to [facility] due to persistent dry heaves and was found to have Na level 121.   The patient has been worked up in the past by GI and has a history of esophageal dysmotility disorder. Her daughter states that she gets these episodes of persistent nausea without vomiting every few months. She denies abdominal pain, diarrhea, constipation, melena or hematochezia. She denies headache, fever, chills, chest pain, dyspnea, palpitations, cough, dysuria or hematuria.    Hospital Course:     Hyponatremia. given hypoosmolality Osm = 257, this is classified as hypoosmolal hyponatremia. differential is extensive but we can safely exclude CHF, cirrhosis as no clinical evidence of volume overload. please note that TSH is WNL and pt already on synthroid so this is also unlikely cause of hyponatremia  - since pt still looks dry on exam and Na has improved with IVF, suspect that pre renal etiology, dehydration, poor oral intake is the main cause. Reemphasized to cont oral hydration at home     Hypothyroidism. TSH is stable and WNL. continue synthroid     Nausea without vomiting. No new episode inpatient.   Essential hypertension. reasonable inpatient control     GERD (gastroesophageal reflux disease) continue PPI     Procedures:  none (i.e. Studies not automatically included, echos, thoracentesis, etc; not x-rays)  Consultations:  none  Discharge Exam: Vitals:   09/23/15 2020 09/24/15 0447  BP: (!) 155/59 (!) 141/51  Pulse: 73 61  Resp: 18 17  Temp: 98.3 F (36.8 C) 97.6 F (36.4 C)    General: alert, no distress  Cardiovascular: s1,s2 rrr Respiratory: CTA BL   Discharge Instructions  Discharge Instructions    Diet - low sodium heart healthy    Complete by:  As directed   Discharge instructions    Complete by:  As directed   Please follow up with primary care doctor in 4-7 days   Increase activity slowly    Complete by:  As directed       Medication List    STOP taking these medications   promethazine 12.5 MG tablet Commonly known as:  PHENERGAN     TAKE these medications   acetaminophen 325 MG tablet Commonly known as:  TYLENOL Take 325-650 mg by mouth every 8 (eight) hours as needed for mild pain or moderate pain.   amLODipine 10 MG tablet Commonly known as:  NORVASC Take 10 mg by mouth at bedtime.   aspirin EC 81 MG tablet Take 81 mg by mouth daily.   CALCIUM-VITAMIN D PO Take 1 tablet by mouth 2 (two) times daily. Calcium 600- D3- 2000   esomeprazole 40 MG capsule Commonly known as:  NEXIUM Take 40 mg by mouth daily at 12 noon.  levothyroxine 50 MCG tablet Commonly known as:  SYNTHROID, LEVOTHROID Take 50 mcg by mouth daily before breakfast.   losartan 100 MG tablet Commonly known as:  COZAAR Take 100 mg by mouth daily.   mirabegron ER 25 MG Tb24 tablet Commonly known as:  MYRBETRIQ Take 25 mg by mouth daily.   mirtazapine 7.5 MG tablet Commonly known as:  REMERON Take 7.5 mg by mouth at bedtime.   MULTIVITAMIN & MINERAL PO Take 1 tablet by mouth daily.   pregabalin 100 MG capsule Commonly known as:  LYRICA Take 100-200 mg by  mouth See admin instructions. 100 mg in the morning and 200 mg in the evening   prochlorperazine 10 MG tablet Commonly known as:  COMPAZINE Take 10 mg by mouth every 6 (six) hours as needed for nausea or vomiting.   traZODone 50 MG tablet Commonly known as:  DESYREL Take 50 mg by mouth at bedtime as needed for sleep.      Allergies  Allergen Reactions  . Other Shortness Of Breath and Other (See Comments)    CONFUSION AND PHYSICALLY SICK: Detergents, formaldehyde, any strong smells  . Penicillins Hives and Swelling    Has patient had a PCN reaction causing immediate rash, facial/tongue/throat swelling, SOB or lightheadedness with hypotension: Yes Has patient had a PCN reaction causing severe rash involving mucus membranes or skin necrosis: NO Has patient had a PCN reaction that required hospitalization already in hospital  Has patient had a PCN reaction occurring within the last 10 years: no If all of the above answers are "NO", then may proceed with Cephalosporin use.   . Sulfa Antibiotics Hives and Swelling  . Lorazepam Other (See Comments)    Restless legs      The results of significant diagnostics from this hospitalization (including imaging, microbiology, ancillary and laboratory) are listed below for reference.    Significant Diagnostic Studies: No results found.  Microbiology: No results found for this or any previous visit (from the past 240 hour(s)).   Labs: Basic Metabolic Panel:  Recent Labs Lab 09/22/15 1720 09/22/15 2337 09/23/15 0702 09/24/15 0252  NA 121*  --  125* 133*  K 3.6  --  3.7 3.8  CL 87*  --  96* 102  CO2 23  --  25 25  GLUCOSE 172*  --  103* 101*  BUN 14  --  9 11  CREATININE 0.57 0.51 0.51 0.57  CALCIUM 8.5*  --  7.5* 8.0*   Liver Function Tests:  Recent Labs Lab 09/22/15 1720  AST 41  ALT 17  ALKPHOS 48  BILITOT 1.0  PROT 6.7  ALBUMIN 4.3   No results for input(s): LIPASE, AMYLASE in the last 168 hours. No results for  input(s): AMMONIA in the last 168 hours. CBC:  Recent Labs Lab 09/22/15 1720 09/22/15 2337 09/23/15 0702 09/24/15 0252  WBC 5.8 5.3 4.4 4.9  NEUTROABS 4.8  --   --   --   HGB 12.9 11.1* 10.1* 10.8*  HCT 35.4* 32.4* 27.9* 30.5*  MCV 88.5 88.8 87.5 89.2  PLT 224 184 190 199   Cardiac Enzymes: No results for input(s): CKTOTAL, CKMB, CKMBINDEX, TROPONINI in the last 168 hours. BNP: BNP (last 3 results) No results for input(s): BNP in the last 8760 hours.  ProBNP (last 3 results) No results for input(s): PROBNP in the last 8760 hours.  CBG:  Recent Labs Lab 09/22/15 2106  GLUCAP 158*       Signed:  Steffanie Mingle  N  [facility] 09/24/2015, 8:29 AM

## 2015-09-24 NOTE — Evaluation (Addendum)
Physical Therapy Evaluation Patient Details Name: Pamela Peterson MRN: [id number] DOB: [birth year] Today's Date: 09/24/2015   History of Present Illness  82 y.o. female with h/o HTN, GERD, osteoporosis admitted with nausea and dry heaving. Dx of hyponatremia.  Clinical Impression  Pt ambulated 160' with RW, no loss of balance. She is at baseline of modified independence with mobility. She denies falls in past 6 months. From PT standpoint she is ready to DC home, no further PT indicated.     Follow Up Recommendations No PT follow up    Equipment Recommendations  None recommended by PT    Recommendations for Other Services       Precautions / Restrictions Precautions Precautions: Fall Precaution Comments: pt reports 2 falls 6 months ago Restrictions Weight Bearing Restrictions: No      Mobility  Bed Mobility               General bed mobility comments: NT- up in recliner  Transfers Overall transfer level: Modified independent Equipment used: Rolling walker (2 wheeled)             General transfer comment: used armrests to push up  Ambulation/Gait Ambulation/Gait assistance: Modified independent (Device/Increase time) Ambulation Distance (Feet): 160 Feet Assistive device: Rolling walker (2 wheeled) Gait Pattern/deviations: WFL(Within Functional Limits)   Gait velocity interpretation: at or above normal speed for age/gender General Gait Details: moderately kyphotic posture, noted h/o osteoporosis; no LOB  Stairs            Wheelchair Mobility    Modified Rankin (Stroke Patients Only)       Balance Overall balance assessment: Modified Independent                                           Pertinent Vitals/Pain Pain Assessment: No/denies pain    Home Living Family/patient expects to be discharged to:: Private residence Living Arrangements: Children Available Help at Discharge: Family;Available 24 hours/day Type of Home:  House Home Access: Stairs to enter Entrance Stairs-Rails: Right Entrance Stairs-Number of Steps: 3 Home Layout: Able to live on main level with bedroom/bathroom Home Equipment: Walker - 4 wheels;Cane - single point      Prior Function Level of Independence: Independent with assistive device(s)         Comments: uses rollator when "not feeling well"; at times doesn't use AD, Independent with bathing/dressing     Hand Dominance        Extremity/Trunk Assessment   Upper Extremity Assessment: Overall WFL for tasks assessed           Lower Extremity Assessment: Overall WFL for tasks assessed      Cervical / Trunk Assessment: Kyphotic  Communication   Communication: No difficulties  Cognition Arousal/Alertness: Awake/alert Behavior During Therapy: WFL for tasks assessed/performed Overall Cognitive Status: Within Functional Limits for tasks assessed                      General Comments      Exercises        Assessment/Plan    PT Assessment Patent does not need any further PT services  PT Diagnosis     PT Problem List    PT Treatment Interventions     PT Goals (Current goals can be found in the Care Plan section) Acute Rehab PT Goals Patient Stated Goal: return to playing cards PT  Goal Formulation: All assessment and education complete, DC therapy    Frequency     Barriers to discharge        Co-evaluation               End of Session Equipment Utilized During Treatment: Gait belt Activity Tolerance: Patient tolerated treatment well Patient left: in chair;with call bell/phone within reach Nurse Communication: Mobility status         Time: 1021-1039 PT Time Calculation (min) (ACUTE ONLY): 18 min   Charges:   PT Evaluation $PT Eval Low Complexity: 1 Procedure     PT G Codes:   PT G-Codes **NOT FOR INPATIENT CLASS**  Functional Assessment Tool Used clinical judgement byJenniferKUhlenberg,PT at07/28/171046    Functional Limitation Mobility: Walking and moving around byJenniferKUhlenberg,PT at07/28/171046   Mobility: Walking and Moving Around Current Status ([id number]) 0 percent impaired, limited or restricted byJenniferKUhlenberg,PT at07/28/171046   Mobility: Walking and Moving Around Goal Status [phone]) 0 percent impaired, limited or restricted byJenniferKUhlenberg,PT at07/28/171046   Mobility: Walking and Moving Around Discharge Status [phone]) 0 percent impaired, limited or restricted         Tamala Ser 09/24/2015, 10:47 AM [phone]

## 2015-10-12 ENCOUNTER — Emergency Department (HOSPITAL_COMMUNITY)
Admission: EM | Admit: 2015-10-12 | Discharge: 2015-10-12 | Disposition: A | Discharge status: Home / Self Care | Payer: Medicare HMO | Attending: Emergency Medicine | Admitting: Emergency Medicine

## 2015-10-12 ENCOUNTER — Encounter (HOSPITAL_COMMUNITY): Payer: Self-pay

## 2015-10-12 DIAGNOSIS — Z791 Long term (current) use of non-steroidal anti-inflammatories (NSAID): Secondary | ICD-10-CM | POA: Diagnosis not present

## 2015-10-12 DIAGNOSIS — Z79899 Other long term (current) drug therapy: Secondary | ICD-10-CM | POA: Insufficient documentation

## 2015-10-12 DIAGNOSIS — E876 Hypokalemia: Secondary | ICD-10-CM | POA: Insufficient documentation

## 2015-10-12 DIAGNOSIS — E039 Hypothyroidism, unspecified: Secondary | ICD-10-CM | POA: Diagnosis not present

## 2015-10-12 DIAGNOSIS — Z7982 Long term (current) use of aspirin: Secondary | ICD-10-CM | POA: Insufficient documentation

## 2015-10-12 DIAGNOSIS — I10 Essential (primary) hypertension: Secondary | ICD-10-CM | POA: Insufficient documentation

## 2015-10-12 DIAGNOSIS — R110 Nausea: Secondary | ICD-10-CM

## 2015-10-12 DIAGNOSIS — E871 Hypo-osmolality and hyponatremia: Secondary | ICD-10-CM | POA: Diagnosis not present

## 2015-10-12 LAB — CBC WITH DIFFERENTIAL/PLATELET
Basophils Absolute: 0 10*3/uL (ref 0.0–0.1)
Basophils Relative: 0 %
Eosinophils Absolute: 0 10*3/uL (ref 0.0–0.7)
Eosinophils Relative: 0 %
HEMATOCRIT: 35.5 % — AB (ref 36.0–46.0)
Hemoglobin: 12.6 g/dL (ref 12.0–15.0)
LYMPHS ABS: 0.6 10*3/uL — AB (ref 0.7–4.0)
LYMPHS PCT: 8 %
MCH: 32 pg (ref 26.0–34.0)
MCHC: 35.5 g/dL (ref 30.0–36.0)
MCV: 90.1 fL (ref 78.0–100.0)
Monocytes Absolute: 0.7 10*3/uL (ref 0.1–1.0)
Monocytes Relative: 9 %
NEUTROS PCT: 83 %
Neutro Abs: 6.5 10*3/uL (ref 1.7–7.7)
Platelets: 211 10*3/uL (ref 150–400)
RBC: 3.94 MIL/uL (ref 3.87–5.11)
RDW: 13.6 % (ref 11.5–15.5)
WBC: 7.9 10*3/uL (ref 4.0–10.5)

## 2015-10-12 LAB — BASIC METABOLIC PANEL
ANION GAP: 10 (ref 5–15)
BUN: 19 mg/dL (ref 6–20)
CALCIUM: 8.7 mg/dL — AB (ref 8.9–10.3)
CO2: 24 mmol/L (ref 22–32)
Chloride: 95 mmol/L — ABNORMAL LOW (ref 101–111)
Creatinine, Ser: 0.62 mg/dL (ref 0.44–1.00)
GFR calc Af Amer: >60 mL/min (ref >60)
GFR calc non Af Amer: >60 mL/min (ref >60)
GLUCOSE: 126 mg/dL — AB (ref 65–99)
Potassium: 3.2 mmol/L — ABNORMAL LOW (ref 3.5–5.1)
Sodium: 129 mmol/L — ABNORMAL LOW (ref 135–145)

## 2015-10-12 MED ORDER — PROCHLORPERAZINE EDISYLATE 5 MG/ML IJ SOLN
10.0000 mg | Freq: Once | INTRAMUSCULAR | Status: AC
  Administered 2015-10-12: 10 mg via INTRAVENOUS
  Filled 2015-10-12: qty 2

## 2015-10-12 MED ORDER — SODIUM CHLORIDE 0.9 % IV BOLUS (SEPSIS)
1000.0000 mL | Freq: Once | INTRAVENOUS | Status: AC
  Administered 2015-10-12: 1000 mL via INTRAVENOUS

## 2015-10-12 NOTE — ED Notes (Signed)
PT used bedpan. Urine at bedside

## 2015-10-12 NOTE — ED Triage Notes (Signed)
Pt presents with family member with c/o nausea that started this morning. Per family, pt has these episodes every few weeks, comes to the ER to get hydrated. Pt's family member reported that her sodium is usually low during these episodes and that they have a hard time getting her to drink to stay hydrated.

## 2015-10-12 NOTE — ED Provider Notes (Signed)
[facility]-EMERGENCY DEPT Provider Note   CSN: [id number] Arrival date & time: 10/12/15  1431     History   Chief Complaint Chief Complaint  Patient presents with  . Nausea    HPI Pamela Peterson is a 82 y.o. female.  HPI  CC: fatigue  Onset/Duration: 8 hours Timing: constant; rapidly progressive Location: n/a Quality: n/a Severity: moderate Modifying Factors:  Improved by: usually with hydration  Worsened by: not taking PO Associated Signs/Symptoms:  Pertinent (+): "delirium" per the daughter. Nausea. Had Diarrhea this morning.  Pertinent (-): fever, chills, emesis, chest pain, SOB, abd pain, dysuria Context: pt has been seen here multiple times for the same and found to have low sodium that improves with IVF, resulting in resolution of her symptoms.  No recent Abx. No travel or suspicious food.   Past Medical History:  Diagnosis Date  . Hyperlipidemia   . Hypertension   . Hypothyroid   . Osteoporosis     Patient Active Problem List   Diagnosis Date Noted  . Hyponatremia 09/22/2015  . Dysphagia   . Altered mental status 03/01/2015  . Restless legs 03/01/2015  . Hypokalemia 03/01/2015  . Failure to thrive in adult 01/23/2015  . Hypothyroidism 01/23/2015  . Diarrhea 01/23/2015  . Nausea without vomiting 01/23/2015  . Essential hypertension 01/23/2015  . GERD (gastroesophageal reflux disease) 01/23/2015    Past Surgical History:  Procedure Laterality Date  . BLADDER SURGERY      OB History    No data available       Home Medications    Prior to Admission medications   Medication Sig Start Date End Date Taking? Authorizing Provider  amLODipine (NORVASC) 10 MG tablet Take 10 mg by mouth at bedtime.   Yes Historical Provider, MD  aspirin EC 81 MG tablet Take 81 mg by mouth daily.   Yes Historical Provider, MD  CALCIUM-VITAMIN D PO Take 1 tablet by mouth 2 (two) times daily. Calcium 600- D3- 2000   Yes Historical Provider, MD  esomeprazole (NEXIUM) 40 MG  capsule Take 40 mg by mouth daily at 12 noon.   Yes Historical Provider, MD  levothyroxine (SYNTHROID, LEVOTHROID) 50 MCG tablet Take 50 mcg by mouth daily before breakfast.   Yes Historical Provider, MD  losartan (COZAAR) 100 MG tablet Take 100 mg by mouth daily.   Yes Historical Provider, MD  mirabegron ER (MYRBETRIQ) 25 MG TB24 tablet Take 25 mg by mouth daily.    Yes Historical Provider, MD  Multiple Vitamins-Minerals (MULTIVITAMIN & MINERAL PO) Take 1 tablet by mouth daily.   Yes Historical Provider, MD  pregabalin (LYRICA) 100 MG capsule Take 100-200 mg by mouth See admin instructions. 100 mg in the morning and 200 mg in the evening   Yes Historical Provider, MD  prochlorperazine (COMPAZINE) 10 MG tablet Take 10 mg by mouth every 6 (six) hours as needed for nausea or vomiting.   Yes Historical Provider, MD  acetaminophen (TYLENOL) 325 MG tablet Take 325-650 mg by mouth every 8 (eight) hours as needed for mild pain or moderate pain.    Historical Provider, MD  mirtazapine (REMERON) 7.5 MG tablet Take 7.5 mg by mouth at bedtime.    Historical Provider, MD  traZODone (DESYREL) 50 MG tablet Take 50 mg by mouth at bedtime as needed for sleep.    Historical Provider, MD    Family History Family History  Problem Relation Age of Onset  . Heart attack Brother   . COPD Sister   .  Cancer Brother   . Tuberculosis Mother   . Heart attack Father   . Alcohol abuse Father     Social History Social History  Substance Use Topics  . Smoking status: Never Smoker  . Smokeless tobacco: Never Used  . Alcohol use No     Allergies   Other; Penicillins; Sulfa antibiotics; and Lorazepam   Review of Systems Review of Systems Ten systems are reviewed and are negative for acute change except as noted in the HPI   Physical Exam Updated Vital Signs BP 108/89 (BP Location: Right Arm)   Pulse 83   Temp 97.8 F (36.6 C) (Oral)   Resp 18   Ht 4\' 3"  (1.295 m)   Wt 90 lb (40.8 kg)   SpO2 100%    BMI 24.33 kg/m   Physical Exam  Constitutional: She appears well-developed and well-nourished. No distress.  HENT:  Head: Normocephalic and atraumatic.  Right Ear: External ear normal.  Left Ear: External ear normal.  Nose: Nose normal.  Eyes: Conjunctivae and EOM are normal. Pupils are equal, round, and reactive to light. Right eye exhibits no discharge. Left eye exhibits no discharge. No scleral icterus.  Neck: Normal range of motion. Neck supple.  Cardiovascular: Normal rate, regular rhythm and normal heart sounds.  Exam reveals no gallop and no friction rub.   No murmur heard. Pulmonary/Chest: Effort normal and breath sounds normal. No stridor. No respiratory distress. She has no wheezes.  Abdominal: Soft. She exhibits no distension. There is no tenderness.  Musculoskeletal: She exhibits no edema or tenderness.  Neurological: She is alert. She is disoriented (oriented to person, place, and current events). GCS eye subscore is 4. GCS verbal subscore is 5. GCS motor subscore is 6.  Skin: Skin is warm and dry. No rash noted. She is not diaphoretic. No erythema.  Psychiatric: She has a normal mood and affect.     ED Treatments / Results  Labs (all labs ordered are listed, but only abnormal results are displayed) Labs Reviewed  CBC WITH DIFFERENTIAL/PLATELET - Abnormal; Notable for the following:       Result Value   HCT 35.5 (*)    Lymphs Abs 0.6 (*)    All other components within normal limits  BASIC METABOLIC PANEL - Abnormal; Notable for the following:    Sodium 129 (*)    Potassium 3.2 (*)    Chloride 95 (*)    Glucose, Bld 126 (*)    Calcium 8.7 (*)    All other components within normal limits    EKG  EKG Interpretation  Date/Time:  Tuesday October 12 2015 16:35:05 EDT Ventricular Rate:  75 PR Interval:    QRS Duration: 140 QT Interval:  495 QTC Calculation: 553 R Axis:   0 Text Interpretation:  Sinus rhythm Left bundle branch block Confirmed by [facility] MD,  Veeda Virgo ([id number]) on 10/12/2015 4:41:06 PM       Radiology No results found.  Procedures Procedures (including critical care time)  Medications Ordered in ED Medications  sodium chloride 0.9 % bolus 1,000 mL (0 mLs Intravenous Stopped 10/12/15 1849)  prochlorperazine (COMPAZINE) injection 10 mg (10 mg Intravenous Given 10/12/15 1711)     Initial Impression / Assessment and Plan / ED Course  I have reviewed the triage vital signs and the nursing notes.  Pertinent labs & imaging results that were available during my care of the patient were reviewed by me and considered in my medical decision making (see chart for details).  Clinical Course    Similar nausea to prior episodes of low sodium. Sodium here 129 which is not significantly decreased. Also noted mild hypokalemia. Patient provided with Compazine and IV fluids with improved symptoms.  Patient denied any cardiac symptoms, EKG unchanged. Low suspicion for ACS at this time.  No historical or laboratory evidence of possible infection.  Abdomen benign.  Patient is safe for discharge with strict return precautions. Patient is to follow up closely with her primary care provider for repeat electrolytes and further workup and management of the patient's recurrent nausea.  Final Clinical Impressions(s) / ED Diagnoses   Final diagnoses:  Nausea without vomiting  Hypokalemia  Hyponatremia   Disposition: Discharge  Condition: Good  I have discussed the results, Dx and Tx plan with the patient and daughter who expressed understanding and agree(s) with the plan. Discharge instructions discussed at great length. The patient and daughter were given strict return precautions who verbalized understanding of the instructions. No further questions at time of discharge.    Discharge Medication List as of 10/12/2015  7:49 PM      Follow Up: Leola Brazilebecca B Everly, DO [id number] [place] [address] [phone]  Call  in 1 day       Nira ConnPedro Eduardo Brandilee Pies, [place] 10/13/15 [phone]

## 2015-11-11 ENCOUNTER — Emergency Department (HOSPITAL_BASED_OUTPATIENT_CLINIC_OR_DEPARTMENT_OTHER)
Admission: EM | Admit: 2015-11-11 | Discharge: 2015-11-12 | Disposition: A | Discharge status: Home / Self Care | Payer: Medicare HMO | Attending: Emergency Medicine | Admitting: Emergency Medicine

## 2015-11-11 ENCOUNTER — Encounter (HOSPITAL_BASED_OUTPATIENT_CLINIC_OR_DEPARTMENT_OTHER): Payer: Self-pay

## 2015-11-11 DIAGNOSIS — E039 Hypothyroidism, unspecified: Secondary | ICD-10-CM | POA: Insufficient documentation

## 2015-11-11 DIAGNOSIS — E871 Hypo-osmolality and hyponatremia: Secondary | ICD-10-CM | POA: Insufficient documentation

## 2015-11-11 DIAGNOSIS — Z7982 Long term (current) use of aspirin: Secondary | ICD-10-CM | POA: Insufficient documentation

## 2015-11-11 DIAGNOSIS — I10 Essential (primary) hypertension: Secondary | ICD-10-CM | POA: Diagnosis not present

## 2015-11-11 DIAGNOSIS — F419 Anxiety disorder, unspecified: Secondary | ICD-10-CM | POA: Diagnosis present

## 2015-11-11 HISTORY — DX: Anxiety disorder, unspecified: F41.9

## 2015-11-11 NOTE — ED Provider Notes (Addendum)
[facility] Provider Note   CSN: [id number] Arrival date & time: 11/11/15  2149  By signing my name below, I, Pamela Peterson, attest that this documentation has been prepared under the direction and in the presence of Pamela Hiott, MD . Electronically Signed: Majel Peterson, Scribe. 11/11/2015. 11:45 PM.  History   Chief Complaint Chief Complaint  Patient presents with  . Anxiety   The history is provided by a relative. No language interpreter was used.  Anxiety  This is a recurrent problem. The current episode started 3 to 5 hours ago. The problem has been gradually worsening. Pertinent negatives include no chest pain, no abdominal pain, no headaches and no shortness of breath. Nothing aggravates the symptoms. Nothing relieves the symptoms. Treatments tried: compazine. The treatment provided no relief.   HPI Comments: Pamela Peterson is a 82 y.o. female with PMHx of anxiety, HTN and HLD, who presents to the Emergency Department complaining of gradually worsening, nausea and increased anxiety that began at ~5:30 PM this evening. Pt's daughter reports associated shortness of breath and difficulty swallowing. Per daughter, pt typically experiences similar episodes about 1 times a month with no known cause. She denies fever, vomiting, diarrhea, congestion and cough. She notes pt takes compazine for her anxiety/nausea.   PCP: Dr. Suzie Portela   Past Medical History:  Diagnosis Date  . Anxiety   . Hyperlipidemia   . Hypertension   . Hypothyroid   . Osteoporosis     Patient Active Problem List   Diagnosis Date Noted  . Hyponatremia 09/22/2015  . Dysphagia   . Altered mental status 03/01/2015  . Restless legs 03/01/2015  . Hypokalemia 03/01/2015  . Failure to thrive in adult 01/23/2015  . Hypothyroidism 01/23/2015  . Diarrhea 01/23/2015  . Nausea without vomiting 01/23/2015  . Essential hypertension 01/23/2015  . GERD (gastroesophageal reflux disease) 01/23/2015    Past Surgical  History:  Procedure Laterality Date  . BLADDER SURGERY      OB History    No data available     Home Medications    Prior to Admission medications   Medication Sig Start Date End Date Taking? Authorizing Provider  acetaminophen (TYLENOL) 325 MG tablet Take 325-650 mg by mouth every 8 (eight) hours as needed for mild pain or moderate pain.    Historical Provider, MD  amLODipine (NORVASC) 10 MG tablet Take 10 mg by mouth at bedtime.    Historical Provider, MD  aspirin EC 81 MG tablet Take 81 mg by mouth daily.    Historical Provider, MD  CALCIUM-VITAMIN D PO Take 1 tablet by mouth 2 (two) times daily. Calcium 600- D3- 2000    Historical Provider, MD  esomeprazole (NEXIUM) 40 MG capsule Take 40 mg by mouth daily at 12 noon.    Historical Provider, MD  levothyroxine (SYNTHROID, LEVOTHROID) 50 MCG tablet Take 50 mcg by mouth daily before breakfast.    Historical Provider, MD  losartan (COZAAR) 100 MG tablet Take 100 mg by mouth daily.    Historical Provider, MD  mirabegron ER (MYRBETRIQ) 25 MG TB24 tablet Take 25 mg by mouth daily.     Historical Provider, MD  mirtazapine (REMERON) 7.5 MG tablet Take 7.5 mg by mouth at bedtime.    Historical Provider, MD  Multiple Vitamins-Minerals (MULTIVITAMIN & MINERAL PO) Take 1 tablet by mouth daily.    Historical Provider, MD  pregabalin (LYRICA) 100 MG capsule Take 100-200 mg by mouth See admin instructions. 100 mg in the morning and 200  mg in the evening    Historical Provider, MD  prochlorperazine (COMPAZINE) 10 MG tablet Take 10 mg by mouth every 6 (six) hours as needed for nausea or vomiting.    Historical Provider, MD  traZODone (DESYREL) 50 MG tablet Take 50 mg by mouth at bedtime as needed for sleep.    Historical Provider, MD    Family History Family History  Problem Relation Age of Onset  . Heart attack Brother   . COPD Sister   . Cancer Brother   . Tuberculosis Mother   . Heart attack Father   . Alcohol abuse Father     Social  History Social History  Substance Use Topics  . Smoking status: Never Smoker  . Smokeless tobacco: Never Used  . Alcohol use No     Allergies   Other; Penicillins; Sulfa antibiotics; and Lorazepam   Review of Systems Review of Systems  Constitutional: Negative for diaphoresis and fever.  HENT: Positive for trouble swallowing. Negative for congestion.   Eyes: Negative for photophobia.  Respiratory: Negative for cough and shortness of breath.   Cardiovascular: Negative for chest pain.  Gastrointestinal: Positive for nausea. Negative for abdominal pain, diarrhea and vomiting.  Genitourinary: Negative for dysuria.  Neurological: Negative for dizziness, tremors, seizures, syncope, speech difficulty, weakness, light-headedness, numbness and headaches.  All other systems reviewed and are negative.  Physical Exam Updated Vital Signs BP 156/77 (BP Location: Left Arm)   Pulse 83   Temp 98.2 F (36.8 C) (Oral)   Resp 22   Ht 4\' 3"  (1.295 m)   Wt 89 lb (40.4 kg)   SpO2 100%   BMI 24.06 kg/m   Physical Exam  Constitutional: She appears well-developed and well-nourished.  HENT:  Head: Normocephalic.  Mouth/Throat: Oropharynx is clear and moist. No oropharyngeal exudate.  Eyes: Conjunctivae and EOM are normal. Pupils are equal, round, and reactive to light. Right eye exhibits no discharge. Left eye exhibits no discharge. No scleral icterus.  Neck: Normal range of motion. Neck supple. No JVD present. No tracheal deviation present.  Trachea is midline. No stridor or carotid bruits.   Cardiovascular: Normal rate, regular rhythm, normal heart sounds and intact distal pulses.   No murmur heard. Pulmonary/Chest: Effort normal and breath sounds normal. No stridor. No respiratory distress. She has no wheezes. She has no rales.  Lungs CTA bilaterally.  Abdominal: Soft. She exhibits no distension. There is no tenderness. There is no rebound and no guarding.  Hyperactive bowel sounds to her  neck  Musculoskeletal: Normal range of motion. She exhibits no edema or tenderness.  All compartments soft   Lymphadenopathy:    She has no cervical adenopathy.  Neurological: She is alert. She has normal reflexes. She displays normal reflexes. She exhibits normal muscle tone.  Skin: Skin is warm and dry. Capillary refill takes less than 2 seconds.  Psychiatric: She has a normal mood and affect. Her behavior is normal.  Nursing note and vitals reviewed.  ED Treatments / Results  Labs (all labs ordered are listed, but only abnormal results are displayed) Labs Reviewed - No data to display  EKG  EKG Interpretation None       Radiology No results found.  Procedures Procedures (including critical care time)  Medications Ordered in ED Medications - No data to display  Initial Impression / Assessment and Plan / ED Course  I have reviewed the triage vital signs and the nursing notes.  Pertinent labs & imaging results that were available  during my care of the patient were reviewed by me and considered in my medical decision making (see chart for details).  Clinical Course  DIAGNOSTIC STUDIES:  Oxygen Saturation is 100% on RA, normal by my interpretation.    COORDINATION OF CARE:  11:46 AM Discussed treatment plan with pt at bedside and pt agreed to plan.  Vitals:   11/12/15 0100 11/12/15 0130  BP: 143/70 146/70  Pulse: 83 84  Resp: 21 14  Temp: 98 F (36.7 C)    Results for orders placed or performed during the hospital encounter of 11/11/15  CBC with Differential/Platelet  Result Value Ref Range   WBC 6.2 4.0 - 10.5 K/uL   RBC 3.74 (L) 3.87 - 5.11 MIL/uL   Hemoglobin 12.4 12.0 - 15.0 g/dL   HCT 16.134.0 (L) 09.636.0 - 04.546.0 %   MCV 90.9 78.0 - 100.0 fL   MCH 33.2 26.0 - 34.0 pg   MCHC 36.5 (H) 30.0 - 36.0 g/dL   RDW 40.913.0 81.111.5 - 91.415.5 %   Platelets 213 150 - 400 K/uL   Neutrophils Relative % 86 %   Neutro Abs 5.3 1.7 - 7.7 K/uL   Lymphocytes Relative 9 %   Lymphs Abs  0.5 (L) 0.7 - 4.0 K/uL   Monocytes Relative 5 %   Monocytes Absolute 0.3 0.1 - 1.0 K/uL   Eosinophils Relative 0 %   Eosinophils Absolute 0.0 0.0 - 0.7 K/uL   Basophils Relative 0 %   Basophils Absolute 0.0 0.0 - 0.1 K/uL  Basic metabolic panel  Result Value Ref Range   Sodium 127 (L) 135 - 145 mmol/L   Potassium 3.3 (L) 3.5 - 5.1 mmol/L   Chloride 94 (L) 101 - 111 mmol/L   CO2 21 (L) 22 - 32 mmol/L   Glucose, Bld 152 (H) 65 - 99 mg/dL   BUN 14 6 - 20 mg/dL   Creatinine, Ser 7.820.55 0.44 - 1.00 mg/dL   Calcium 8.6 (L) 8.9 - 10.3 mg/dL   GFR calc non Af Amer >60 >60 mL/min   GFR calc Af Amer >60 >60 mL/min   Anion gap 12 5 - 15  Troponin I  Result Value Ref Range   Troponin I <0.03 <0.03 ng/mL  Lipase, blood  Result Value Ref Range   Lipase 32 11 - 51 U/L  Urinalysis, Routine w reflex microscopic ([facility])  Result Value Ref Range   Color, Urine YELLOW YELLOW   APPearance CLEAR CLEAR   Specific Gravity, Urine 1.009 1.005 - 1.030   pH 8.5 (H) 5.0 - 8.0   Glucose, UA NEGATIVE NEGATIVE mg/dL   Hgb urine dipstick NEGATIVE NEGATIVE   Bilirubin Urine NEGATIVE NEGATIVE   Ketones, ur 15 (A) NEGATIVE mg/dL   Protein, ur NEGATIVE NEGATIVE mg/dL   Nitrite NEGATIVE NEGATIVE   Leukocytes, UA TRACE (A) NEGATIVE  Urine microscopic-add on  Result Value Ref Range   Squamous Epithelial / LPF 0-5 (A) NONE SEEN   WBC, UA 0-5 0 - 5 WBC/hpf   RBC / HPF NONE SEEN 0 - 5 RBC/hpf   Bacteria, UA RARE (A) NONE SEEN   No results found.  EKG Interpretation  Date/Time:  Friday November 12 2015 00:35:30 EDT Ventricular Rate:  82 PR Interval:    QRS Duration: 140 QT Interval:  447 QTC Calculation: 523 R Axis:   16 Text Interpretation:  Sinus rhythm IVCD, consider atypical LBBB Confirmed by [facility]  [facility], Macon Sandiford ([id number]) on 11/12/2015 1:02:11 AM  Medications  sodium chloride 0.9 % bolus 500 mL (0 mLs Intravenous Stopped 11/12/15 0139)  ondansetron (ZOFRAN) injection 4 mg (4 mg  Intravenous Given 11/12/15 0105)  sodium chloride 0.9 % bolus 500 mL (0 mLs Intravenous Stopped 11/12/15 0212)    I personally performed the services described in this documentation, which was scribed in my presence. The recorded information has been reviewed and is accurate.   Final Clinical Impressions(s) / ED Diagnoses   Final diagnoses:  None   PO challenged successfully.  See you doctor in the AM to discuss further work up of ongoing hyponatremia.  Increase salt content in your diet.   New Prescriptions New Prescriptions   No medications on file  Will d/c with zofran.  All questions answered to patient's satisfaction. Based on history and exam patient has been appropriately medically screened and emergency conditions excluded. Patient is stable for discharge at this time. Follow up with your PMD for recheck in 2 days and strict return precautions given   Hila Bolding, MD 11/12/15 [id number]    Cy Blamer, MD 11/12/15 [phone]

## 2015-11-11 NOTE — ED Triage Notes (Addendum)
Daughter states pt has hx of anxiety-increase in anxiety started approx 530pm-was given compazine for nausea with no relief-NAD-presents to triage in w/c-denies pain-denies SI/HI

## 2015-11-12 LAB — CBC WITH DIFFERENTIAL/PLATELET
BASOS PCT: 0 %
Basophils Absolute: 0 10*3/uL (ref 0.0–0.1)
Eosinophils Absolute: 0 10*3/uL (ref 0.0–0.7)
Eosinophils Relative: 0 %
HEMATOCRIT: 34 % — AB (ref 36.0–46.0)
HEMOGLOBIN: 12.4 g/dL (ref 12.0–15.0)
LYMPHS ABS: 0.5 10*3/uL — AB (ref 0.7–4.0)
Lymphocytes Relative: 9 %
MCH: 33.2 pg (ref 26.0–34.0)
MCHC: 36.5 g/dL — AB (ref 30.0–36.0)
MCV: 90.9 fL (ref 78.0–100.0)
MONOS PCT: 5 %
Monocytes Absolute: 0.3 10*3/uL (ref 0.1–1.0)
NEUTROS ABS: 5.3 10*3/uL (ref 1.7–7.7)
NEUTROS PCT: 86 %
Platelets: 213 10*3/uL (ref 150–400)
RBC: 3.74 MIL/uL — ABNORMAL LOW (ref 3.87–5.11)
RDW: 13 % (ref 11.5–15.5)
WBC: 6.2 10*3/uL (ref 4.0–10.5)

## 2015-11-12 LAB — LIPASE, BLOOD: LIPASE: 32 U/L (ref 11–51)

## 2015-11-12 LAB — BASIC METABOLIC PANEL
Anion gap: 12 (ref 5–15)
BUN: 14 mg/dL (ref 6–20)
CHLORIDE: 94 mmol/L — AB (ref 101–111)
CO2: 21 mmol/L — AB (ref 22–32)
CREATININE: 0.55 mg/dL (ref 0.44–1.00)
Calcium: 8.6 mg/dL — ABNORMAL LOW (ref 8.9–10.3)
GFR calc non Af Amer: >60 mL/min (ref >60)
Glucose, Bld: 152 mg/dL — ABNORMAL HIGH (ref 65–99)
POTASSIUM: 3.3 mmol/L — AB (ref 3.5–5.1)
Sodium: 127 mmol/L — ABNORMAL LOW (ref 135–145)

## 2015-11-12 LAB — URINALYSIS, ROUTINE W REFLEX MICROSCOPIC
Bilirubin Urine: NEGATIVE
Glucose, UA: NEGATIVE mg/dL
Hgb urine dipstick: NEGATIVE
Ketones, ur: 15 mg/dL — AB
NITRITE: NEGATIVE
PROTEIN: NEGATIVE mg/dL
Specific Gravity, Urine: 1.009 (ref 1.005–1.030)
pH: 8.5 — ABNORMAL HIGH (ref 5.0–8.0)

## 2015-11-12 LAB — URINE MICROSCOPIC-ADD ON: RBC / HPF: NONE SEEN RBC/hpf (ref 0–5)

## 2015-11-12 LAB — TROPONIN I: Troponin I: <0.03 ng/mL (ref <0.03)

## 2015-11-12 MED ORDER — ONDANSETRON 8 MG PO TBDP: ORAL_TABLET | ORAL | 0 refills | Status: DC

## 2015-11-12 MED ORDER — SODIUM CHLORIDE 0.9 % IV BOLUS (SEPSIS)
500.0000 mL | Freq: Once | INTRAVENOUS | Status: AC
  Administered 2015-11-12: 500 mL via INTRAVENOUS

## 2015-11-12 MED ORDER — ONDANSETRON 8 MG PO TBDP
8.0000 mg | ORAL_TABLET | Freq: Once | ORAL | Status: AC
  Administered 2015-11-12: 8 mg via ORAL
  Filled 2015-11-12: qty 1

## 2015-11-12 MED ORDER — ONDANSETRON HCL 4 MG/2ML IJ SOLN
4.0000 mg | Freq: Once | INTRAMUSCULAR | Status: AC
  Administered 2015-11-12: 4 mg via INTRAVENOUS
  Filled 2015-11-12: qty 2

## 2015-11-12 NOTE — ED Notes (Signed)
Pt's daughter verbalizes understanding of dc instructions and denies any further needs at this time 

## 2015-11-12 NOTE — ED Notes (Signed)
Pt tolerated PO fluids

## 2017-01-29 ENCOUNTER — Inpatient Hospital Stay (HOSPITAL_COMMUNITY)
Admission: EM | Admit: 2017-01-29 | Discharge: 2017-01-31 | DRG: 291 | Disposition: A | Discharge status: Hospice | Payer: Medicare Other | Attending: Family Medicine | Admitting: Family Medicine

## 2017-01-29 ENCOUNTER — Other Ambulatory Visit: Payer: Self-pay

## 2017-01-29 ENCOUNTER — Emergency Department (HOSPITAL_COMMUNITY): Payer: Medicare Other

## 2017-01-29 ENCOUNTER — Encounter (HOSPITAL_COMMUNITY): Payer: Self-pay | Admitting: Emergency Medicine

## 2017-01-29 DIAGNOSIS — Z6820 Body mass index (BMI) 20.0-20.9, adult: Secondary | ICD-10-CM

## 2017-01-29 DIAGNOSIS — E039 Hypothyroidism, unspecified: Secondary | ICD-10-CM | POA: Diagnosis present

## 2017-01-29 DIAGNOSIS — Z888 Allergy status to other drugs, medicaments and biological substances status: Secondary | ICD-10-CM

## 2017-01-29 DIAGNOSIS — G2581 Restless legs syndrome: Secondary | ICD-10-CM | POA: Diagnosis present

## 2017-01-29 DIAGNOSIS — W19XXXA Unspecified fall, initial encounter: Secondary | ICD-10-CM | POA: Diagnosis present

## 2017-01-29 DIAGNOSIS — J441 Chronic obstructive pulmonary disease with (acute) exacerbation: Secondary | ICD-10-CM

## 2017-01-29 DIAGNOSIS — K219 Gastro-esophageal reflux disease without esophagitis: Secondary | ICD-10-CM | POA: Diagnosis present

## 2017-01-29 DIAGNOSIS — R64 Cachexia: Secondary | ICD-10-CM | POA: Diagnosis present

## 2017-01-29 DIAGNOSIS — M810 Age-related osteoporosis without current pathological fracture: Secondary | ICD-10-CM | POA: Diagnosis present

## 2017-01-29 DIAGNOSIS — Z7189 Other specified counseling: Secondary | ICD-10-CM | POA: Diagnosis not present

## 2017-01-29 DIAGNOSIS — Z515 Encounter for palliative care: Secondary | ICD-10-CM | POA: Diagnosis present

## 2017-01-29 DIAGNOSIS — E785 Hyperlipidemia, unspecified: Secondary | ICD-10-CM | POA: Diagnosis present

## 2017-01-29 DIAGNOSIS — Z8249 Family history of ischemic heart disease and other diseases of the circulatory system: Secondary | ICD-10-CM

## 2017-01-29 DIAGNOSIS — I509 Heart failure, unspecified: Secondary | ICD-10-CM | POA: Diagnosis not present

## 2017-01-29 DIAGNOSIS — F419 Anxiety disorder, unspecified: Secondary | ICD-10-CM | POA: Diagnosis present

## 2017-01-29 DIAGNOSIS — Z9119 Patient's noncompliance with other medical treatment and regimen: Secondary | ICD-10-CM | POA: Diagnosis not present

## 2017-01-29 DIAGNOSIS — Y92129 Unspecified place in nursing home as the place of occurrence of the external cause: Secondary | ICD-10-CM

## 2017-01-29 DIAGNOSIS — F0390 Unspecified dementia without behavioral disturbance: Secondary | ICD-10-CM | POA: Diagnosis present

## 2017-01-29 DIAGNOSIS — R739 Hyperglycemia, unspecified: Secondary | ICD-10-CM | POA: Diagnosis present

## 2017-01-29 DIAGNOSIS — F329 Major depressive disorder, single episode, unspecified: Secondary | ICD-10-CM | POA: Diagnosis present

## 2017-01-29 DIAGNOSIS — E43 Unspecified severe protein-calorie malnutrition: Secondary | ICD-10-CM | POA: Diagnosis present

## 2017-01-29 DIAGNOSIS — Z66 Do not resuscitate: Secondary | ICD-10-CM | POA: Diagnosis present

## 2017-01-29 DIAGNOSIS — N179 Acute kidney failure, unspecified: Secondary | ICD-10-CM | POA: Diagnosis present

## 2017-01-29 DIAGNOSIS — Z9981 Dependence on supplemental oxygen: Secondary | ICD-10-CM

## 2017-01-29 DIAGNOSIS — E876 Hypokalemia: Secondary | ICD-10-CM | POA: Diagnosis present

## 2017-01-29 DIAGNOSIS — I5023 Acute on chronic systolic (congestive) heart failure: Secondary | ICD-10-CM | POA: Diagnosis present

## 2017-01-29 DIAGNOSIS — H9190 Unspecified hearing loss, unspecified ear: Secondary | ICD-10-CM | POA: Diagnosis present

## 2017-01-29 DIAGNOSIS — Z880 Allergy status to penicillin: Secondary | ICD-10-CM | POA: Diagnosis not present

## 2017-01-29 DIAGNOSIS — Z882 Allergy status to sulfonamides status: Secondary | ICD-10-CM

## 2017-01-29 DIAGNOSIS — I110 Hypertensive heart disease with heart failure: Secondary | ICD-10-CM | POA: Diagnosis present

## 2017-01-29 DIAGNOSIS — J9601 Acute respiratory failure with hypoxia: Secondary | ICD-10-CM | POA: Diagnosis not present

## 2017-01-29 HISTORY — DX: Heart failure, unspecified: I50.9

## 2017-01-29 LAB — CBC WITH DIFFERENTIAL/PLATELET
Basophils Absolute: 0 10*3/uL (ref 0.0–0.1)
Basophils Relative: 0 %
Eosinophils Absolute: 0 10*3/uL (ref 0.0–0.7)
Eosinophils Relative: 0 %
HCT: 35.2 % — ABNORMAL LOW (ref 36.0–46.0)
Hemoglobin: 11.4 g/dL — ABNORMAL LOW (ref 12.0–15.0)
LYMPHS ABS: 0.6 10*3/uL — AB (ref 0.7–4.0)
LYMPHS PCT: 9 %
MCH: 30 pg (ref 26.0–34.0)
MCHC: 32.4 g/dL (ref 30.0–36.0)
MCV: 92.6 fL (ref 78.0–100.0)
Monocytes Absolute: 0.4 10*3/uL (ref 0.1–1.0)
Monocytes Relative: 6 %
NEUTROS PCT: 85 %
Neutro Abs: 5.5 10*3/uL (ref 1.7–7.7)
Platelets: 147 10*3/uL — ABNORMAL LOW (ref 150–400)
RBC: 3.8 MIL/uL — AB (ref 3.87–5.11)
RDW: 17 % — ABNORMAL HIGH (ref 11.5–15.5)
WBC: 6.5 10*3/uL (ref 4.0–10.5)

## 2017-01-29 LAB — COMPREHENSIVE METABOLIC PANEL
ALBUMIN: 3.8 g/dL (ref 3.5–5.0)
ALT: 14 U/L (ref 14–54)
AST: 39 U/L (ref 15–41)
Alkaline Phosphatase: 62 U/L (ref 38–126)
Anion gap: 10 (ref 5–15)
BUN: 20 mg/dL (ref 6–20)
CHLORIDE: 107 mmol/L (ref 101–111)
CO2: 26 mmol/L (ref 22–32)
CREATININE: 1.01 mg/dL — AB (ref 0.44–1.00)
Calcium: 9.1 mg/dL (ref 8.9–10.3)
GFR calc non Af Amer: 46 mL/min — ABNORMAL LOW (ref >60)
GFR, EST AFRICAN AMERICAN: 53 mL/min — AB (ref >60)
Glucose, Bld: 135 mg/dL — ABNORMAL HIGH (ref 65–99)
Potassium: 3.9 mmol/L (ref 3.5–5.1)
SODIUM: 143 mmol/L (ref 135–145)
Total Bilirubin: 1.1 mg/dL (ref 0.3–1.2)
Total Protein: 6.7 g/dL (ref 6.5–8.1)

## 2017-01-29 LAB — URINALYSIS, ROUTINE W REFLEX MICROSCOPIC
Bilirubin Urine: NEGATIVE
GLUCOSE, UA: NEGATIVE mg/dL
HGB URINE DIPSTICK: NEGATIVE
Ketones, ur: NEGATIVE mg/dL
Leukocytes, UA: NEGATIVE
Nitrite: NEGATIVE
PH: 6 (ref 5.0–8.0)
Protein, ur: NEGATIVE mg/dL
SPECIFIC GRAVITY, URINE: 1.013 (ref 1.005–1.030)

## 2017-01-29 LAB — I-STAT CG4 LACTIC ACID, ED: Lactic Acid, Venous: 1.26 mmol/L (ref 0.5–1.9)

## 2017-01-29 LAB — BRAIN NATRIURETIC PEPTIDE: B Natriuretic Peptide: 3918.2 pg/mL — ABNORMAL HIGH (ref 0.0–100.0)

## 2017-01-29 LAB — TROPONIN I: TROPONIN I: 0.05 ng/mL — AB (ref <0.03)

## 2017-01-29 MED ORDER — PREGABALIN 75 MG PO CAPS: 150.0000 mg | ORAL_CAPSULE | Freq: Every day | ORAL | Status: DC

## 2017-01-29 MED ORDER — ENOXAPARIN SODIUM 30 MG/0.3ML ~~LOC~~ SOLN
30.0000 mg | SUBCUTANEOUS | Status: DC
  Administered 2017-01-30: 30 mg via SUBCUTANEOUS
  Filled 2017-01-29: qty 0.3

## 2017-01-29 MED ORDER — LEVOTHYROXINE SODIUM 50 MCG PO TABS
50.0000 ug | ORAL_TABLET | Freq: Every day | ORAL | Status: DC
  Administered 2017-01-30 (×2): 50 ug via ORAL
  Filled 2017-01-29 (×2): qty 1

## 2017-01-29 MED ORDER — CALCIUM CARBONATE-VITAMIN D 500-200 MG-UNIT PO TABS
0.5000 | ORAL_TABLET | Freq: Every day | ORAL | Status: DC
  Administered 2017-01-30 – 2017-01-31 (×2): 0.5 via ORAL
  Filled 2017-01-29 (×2): qty 1

## 2017-01-29 MED ORDER — QUETIAPINE FUMARATE 25 MG PO TABS
25.0000 mg | ORAL_TABLET | Freq: Every day | ORAL | Status: DC
  Administered 2017-01-30 (×2): 25 mg via ORAL
  Filled 2017-01-29 (×2): qty 1

## 2017-01-29 MED ORDER — MIRTAZAPINE 15 MG PO TABS
7.5000 mg | ORAL_TABLET | Freq: Every day | ORAL | Status: DC
  Administered 2017-01-30 (×2): 7.5 mg via ORAL
  Filled 2017-01-29 (×2): qty 1

## 2017-01-29 MED ORDER — PANTOPRAZOLE SODIUM 40 MG PO TBEC
40.0000 mg | DELAYED_RELEASE_TABLET | Freq: Every day | ORAL | Status: DC
  Administered 2017-01-30 – 2017-01-31 (×2): 40 mg via ORAL
  Filled 2017-01-29 (×2): qty 1

## 2017-01-29 MED ORDER — IPRATROPIUM-ALBUTEROL 0.5-2.5 (3) MG/3ML IN SOLN
3.0000 mL | Freq: Once | RESPIRATORY_TRACT | Status: AC
  Administered 2017-01-29: 3 mL via RESPIRATORY_TRACT
  Filled 2017-01-29: qty 3

## 2017-01-29 MED ORDER — ACETAMINOPHEN 325 MG PO TABS: 325.0000 mg | ORAL_TABLET | Freq: Three times a day (TID) | ORAL | Status: DC | PRN

## 2017-01-29 MED ORDER — IPRATROPIUM-ALBUTEROL 0.5-2.5 (3) MG/3ML IN SOLN: 3.0000 mL | RESPIRATORY_TRACT | Status: DC | PRN

## 2017-01-29 MED ORDER — BUSPIRONE HCL 10 MG PO TABS
5.0000 mg | ORAL_TABLET | Freq: Three times a day (TID) | ORAL | Status: DC
  Administered 2017-01-30 – 2017-01-31 (×5): 5 mg via ORAL
  Filled 2017-01-29 (×5): qty 1

## 2017-01-29 MED ORDER — METOPROLOL TARTRATE 50 MG PO TABS
50.0000 mg | ORAL_TABLET | Freq: Every day | ORAL | Status: DC
  Administered 2017-01-30 – 2017-01-31 (×2): 50 mg via ORAL
  Filled 2017-01-29 (×2): qty 1

## 2017-01-29 MED ORDER — ASPIRIN EC 81 MG PO TBEC
81.0000 mg | DELAYED_RELEASE_TABLET | Freq: Every day | ORAL | Status: DC
  Administered 2017-01-30 – 2017-01-31 (×2): 81 mg via ORAL
  Filled 2017-01-29 (×2): qty 1

## 2017-01-29 MED ORDER — FUROSEMIDE 10 MG/ML IJ SOLN
60.0000 mg | Freq: Once | INTRAMUSCULAR | Status: AC
  Administered 2017-01-29: 60 mg via INTRAVENOUS
  Filled 2017-01-29: qty 6

## 2017-01-29 MED ORDER — LISINOPRIL 5 MG PO TABS
5.0000 mg | ORAL_TABLET | Freq: Every day | ORAL | Status: DC
  Administered 2017-01-30: 5 mg via ORAL
  Filled 2017-01-29: qty 1

## 2017-01-29 NOTE — ED Provider Notes (Signed)
[facility] EMERGENCY DEPARTMENT Provider Note   CSN: [id number] Arrival date & time: 01/29/17  1257     History   Chief Complaint Chief Complaint  Patient presents with  . Fall    HPI Pamela Peterson is a 83 y.o. female who presents with a fall. PMH significant for HTN, HLD, anxiety, osteoporosis, hypothyroidism, hx of hyponatremia and dehydration due to esophageal dysmotility syndrome. Patient is from [facility]. The patient has been lethargic and less talkative than normal for the past three days according to SNF staff. She wears 2L O2 at baseline and has required 6L O2. She had a fall at the nursing home which was unwitnessed. She denies any pain. She denies SOB. Daughter Nestor Ramp) is POA and is at bedside. Pt is DNR. She usually receives her care at [facility].  LEVEL 5 CAVEAT due to dementia  HPI  Past Medical History:  Diagnosis Date  . Anxiety   . Hyperlipidemia   . Hypertension   . Hypothyroid   . Osteoporosis     Patient Active Problem List   Diagnosis Date Noted  . Hyponatremia 09/22/2015  . Dysphagia   . Altered mental status 03/01/2015  . Restless legs 03/01/2015  . Hypokalemia 03/01/2015  . Failure to thrive in adult 01/23/2015  . Hypothyroidism 01/23/2015  . Diarrhea 01/23/2015  . Nausea without vomiting 01/23/2015  . Essential hypertension 01/23/2015  . GERD (gastroesophageal reflux disease) 01/23/2015    Past Surgical History:  Procedure Laterality Date  . BLADDER SURGERY      OB History    No data available       Home Medications    Prior to Admission medications   Medication Sig Start Date End Date Taking? Authorizing Provider  acetaminophen (TYLENOL) 325 MG tablet Take 325-650 mg by mouth every 8 (eight) hours as needed for mild pain or moderate pain.    [provider]  amLODipine (NORVASC) 10 MG tablet Take 10 mg by mouth at bedtime.    [provider]  aspirin EC 81 MG tablet Take 81  mg by mouth daily.    [provider]  CALCIUM-VITAMIN D PO Take 1 tablet by mouth 2 (two) times daily. Calcium 600- D3- 2000    [provider]  esomeprazole (NEXIUM) 40 MG capsule Take 40 mg by mouth daily at 12 noon.    [provider]  levothyroxine (SYNTHROID, LEVOTHROID) 50 MCG tablet Take 50 mcg by mouth daily before breakfast.    [provider]  losartan (COZAAR) 100 MG tablet Take 100 mg by mouth daily.    [provider]  mirabegron ER (MYRBETRIQ) 25 MG TB24 tablet Take 25 mg by mouth daily.     [provider]  mirtazapine (REMERON) 7.5 MG tablet Take 7.5 mg by mouth at bedtime.    [provider]  Multiple Vitamins-Minerals (MULTIVITAMIN & MINERAL PO) Take 1 tablet by mouth daily.    [provider]  ondansetron (ZOFRAN ODT) 8 MG disintegrating tablet 8mg  ODT q8 hours prn nausea 11/12/15   Palumbo, April, MD  pregabalin (LYRICA) 100 MG capsule Take 100-200 mg by mouth See admin instructions. 100 mg in the morning and 200 mg in the evening    [provider]  prochlorperazine (COMPAZINE) 10 MG tablet Take 10 mg by mouth every 6 (six) hours as needed for nausea or vomiting.    [provider]  traZODone (DESYREL) 50 MG tablet Take 50 mg by  mouth at bedtime as needed for sleep.    [provider]    Family History Family History  Problem Relation Age of Onset  . Heart attack Brother   . COPD Sister   . Cancer Brother   . Tuberculosis Mother   . Heart attack Father   . Alcohol abuse Father     Social History Social History   Tobacco Use  . Smoking status: Never Smoker  . Smokeless tobacco: Never Used  Substance Use Topics  . Alcohol use: No  . Drug use: No     Allergies   Other; Penicillins; Sulfa antibiotics; and Lorazepam   Review of Systems Review of Systems  Unable to perform ROS: Dementia     Physical Exam Updated Vital Signs BP (!) 166/107 (BP Location:  Left Arm)   Pulse 70   Temp 98 F (36.7 C) (Oral)   Resp (!) 28   Ht 4\' 3"  (1.295 m)   Wt 40.8 kg (90 lb)   SpO2 100%   BMI 24.33 kg/m   Physical Exam  Constitutional: She is oriented to person, place, and time. She appears well-developed and well-nourished. No distress.  HENT:  Head: Normocephalic and atraumatic.  Eyes: Conjunctivae are normal. Pupils are equal, round, and reactive to light. Right eye exhibits no discharge. Left eye exhibits no discharge. No scleral icterus.  Neck: Normal range of motion.  No C-spine tenderness  Cardiovascular: Normal rate and regular rhythm. Exam reveals no gallop and no friction rub.  No murmur heard. Pulmonary/Chest: Effort normal. Tachypnea noted. No respiratory distress. She has wheezes. She has rhonchi. She has rales.  Abdominal: Soft. Bowel sounds are normal. She exhibits no distension. There is no tenderness.  Musculoskeletal:  Kyphotic spine  FROM of shoulder, elbow, wrists bilaterally. No tenderness or signs of trauma   Neurological: She is alert and oriented to person, place, and time.  Skin: Skin is warm and dry.  Psychiatric: She has a normal mood and affect. Her behavior is normal.  Nursing note and vitals reviewed.    ED Treatments / Results  Labs (all labs ordered are listed, but only abnormal results are displayed) Labs Reviewed  COMPREHENSIVE METABOLIC PANEL - Abnormal; Notable for the following components:      Result Value   Glucose, Bld 135 (*)    Creatinine, Ser 1.01 (*)    GFR calc non Af Amer 46 (*)    GFR calc Af Amer 53 (*)    All other components within normal limits  CBC WITH DIFFERENTIAL/PLATELET - Abnormal; Notable for the following components:   RBC 3.80 (*)    Hemoglobin 11.4 (*)    HCT 35.2 (*)    RDW 17.0 (*)    Platelets 147 (*)    Lymphs Abs 0.6 (*)    All other components within normal limits  BRAIN NATRIURETIC PEPTIDE - Abnormal; Notable for the following components:   B Natriuretic Peptide  3,918.2 (*)    All other components within normal limits  URINALYSIS, ROUTINE W REFLEX MICROSCOPIC  TROPONIN I  I-STAT CG4 LACTIC ACID, ED    EKG  EKG Interpretation None       Radiology Dg Chest Portable 1 View  Result Date: 01/29/2017 CLINICAL DATA:  83 year old female status post fall while walking. No syncope reported. Dyspnea. EXAM: PORTABLE CHEST 1 VIEW COMPARISON:  03/01/2015 radiographs of the chest FINDINGS: The left heart border is obscured by confluent pulmonary opacity likely to represent a moderate to large  left pleural effusion with compressive atelectasis. Trace right pleural effusion. Pulmonary vascular congestion is noted. The thoracic aorta is calcified and ectatic. Deviation of the trachea convex to the right due to tortuous thoracic aorta. No apparent fracture or pneumothorax. Small loose bodies are seen in the left axillary pouch. IMPRESSION: 1. Bilateral pleural effusions, moderate to large on the left and trace on the right with pulmonary vascular congestion. 2. Aortic atherosclerosis. 3. No acute osseous appearing abnormality. Electronically Signed   By: Tollie Ethavid  Kwon M.D.   On: 01/29/2017 15:11    Procedures Procedures (including critical care time)  Medications Ordered in ED Medications  furosemide (LASIX) injection 60 mg (60 mg Intravenous Given 01/29/17 1514)     Initial Impression / Assessment and Plan / ED Course  I have reviewed the triage vital signs and the nursing notes.  Pertinent labs & imaging results that were available during my care of the patient were reviewed by me and considered in my medical decision making (see chart for details).  83 year old female with CHF exacerbation. She has a new O2 requirement - has been on 6L O2 (normally is on 2L). She is also hypertensive and tachypneic. She appears volume overloaded on exam. Lungs have rales bilaterally and mild expiratory wheezes. She has pitting edema bilaterally. No obvious signs of trauma  from fall. CBC is remarkable for mild anemia. CMP is remarkable for mild hyperglycemia. BNP is 3,918.2 with no value to compare. CXR shows bilateral pleural effusions with moderate-large on the left and small on the right with pulmonary vascular congestion. Daughter reports her EF is ~25-30%. Unfortunately I am not able to access outside records to review echo reports or recent office visits. She was given Lasix IV and will be admitted for further management. Spoke to IM residency team who will admit  Final Clinical Impressions(s) / ED Diagnoses   Final diagnoses:  Acute on chronic congestive heart failure, unspecified heart failure type [facility](HCC)    ED Discharge Orders    None       Bethel BornGekas, Acelin Ferdig Marie, PA-C 01/29/17 1616    Melene PlanFloyd, Dan, DO 01/30/17 [phone]

## 2017-01-29 NOTE — ED Notes (Signed)
Pt placed on a purewick 

## 2017-01-29 NOTE — ED Triage Notes (Signed)
Patient from [facility] with [facility] with complaint of fall while walking without syncope, EMS reports facility denied LOC and that the patient did not hit her head. Facility reports that the patient has been increasingly lethargic x3 days, reports she frequently gets pneumonia. Wears 2L O2 [place] at baseline, EMS reports patient required 6L O2 [place] to maintain O2 saturation above 94%.  Patient is hard of hearing, does not have hearing aids in. History of dementia, states she does not remember falling. Also disoriented to date and location.

## 2017-01-29 NOTE — ED Notes (Signed)
Family being updated

## 2017-01-29 NOTE — H&P (Signed)
Family Medicine Teaching [facility] Admission History and Physical Service Pager: [phone]  Patient name: Pamela Peterson Medical record number: [id number] Date of birth: 08/02/1919 Age: 83 y.o. Gender: female  Primary Care Provider: Leola BrazilEverly, Rebecca B, DO Consultants: none Code Status: DNR  Chief Complaint: fall  Assessment and Plan: Pamela Peterson is a 83 y.o. female presenting with fall and SOB. PMH is significant for HFrEF, dementia, depression/anxiety, hearing loss  Dyspnea- likely CHF exacerbation, would like to r/o ACS as well. Per daughter, POA, patient is frequently admitted to [facility] for CHF exacerbations, last echo in October EF 20-25%. Patient's BNP elevated to almost 4000, pitting edema in LE bilaterally. Pleural effusions seen on CXR. No signs of infiltrate on CXR, no leukocytosis, and no fever making pneumonia unlikely. Increased O2 requirement, on 2L baseline and requiring 6L O2 in ED. Received IV lasix in ED so far with minimal improvement in respiratory status. -admit to med-surg attending Dr. Gwendolyn GrantWalden -vitals per floor w/ pulse ox -continue IV diuresis -supplemental O2 as needed to keep sats >92% -trend troponin -monitor for signs of infection -SLP consult to assess swallowing function as frequent pneumonia may be related to aspiration  Fall- per EMS patient fell today, no head trama on exam, no concerning neurologic findings -PT/OT -up with assistance -fall precautions  HTN- on 5 mg lisinopril. Normotensive in ED. -continue home medication  Hypothyroidism-on 50mcg synthroid daily -continue synthroid  Depression/anxiety- on seroquil, buspar, and remeron  -continue home medications  Severe protein-calorie malnutrition- patient cachectic appearing -nutrition c/s  FEN/GI: pending SLP eval Prophylaxis: lovenox  Disposition: admit to inpatient for management  History of Present Illness:  Pamela Peterson is a 83 y.o. female presenting with shortness of  breath.   Patient is unable to provide history due to dementia. She resides at [facility]. Daughter is at bedside and reports patient is frequently admitted, almost monthly, to [facility] for fluid overload. She also frequently has pneumonia. Patient reportedly fell today at SNF while walking. She did not hit her head, per report. Per daughter she has been unwell for the past year and getting worse each admission. Per daughter she had some leg swelling on Sunday and the swelling comes as goes as well as her respiratory status waxes and wanes. Per daughter she has severe dementia.   Review Of Systems: Per HPI with the following additions:   Review of Systems  Unable to perform ROS: Dementia    Patient Active Problem List   Diagnosis Date Noted  . CHF exacerbation (HCC) 01/29/2017  . Hyponatremia 09/22/2015  . Dysphagia   . Altered mental status 03/01/2015  . Restless legs 03/01/2015  . Failure to thrive in adult 01/23/2015  . Hypothyroidism 01/23/2015  . Essential hypertension 01/23/2015  . GERD (gastroesophageal reflux disease) 01/23/2015    Past Medical History: Past Medical History:  Diagnosis Date  . Anxiety   . CHF (congestive heart failure) (HCC)   . Hyperlipidemia   . Hypertension   . Hypothyroid   . Osteoporosis     Past Surgical History: Past Surgical History:  Procedure Laterality Date  . BLADDER SURGERY      Social History: Social History   Tobacco Use  . Smoking status: Never Smoker  . Smokeless tobacco: Never Used  Substance Use Topics  . Alcohol use: No  . Drug use: No   Additional social history: resides at [facility]  Please also refer to relevant sections of EMR.  Family History: Family  History  Problem Relation Age of Onset  . Heart attack Brother   . COPD Sister   . Cancer Brother   . Tuberculosis Mother   . Heart attack Father   . Alcohol abuse Father    Allergies and Medications: Allergies  Allergen Reactions   . Other Shortness Of Breath and Other (See Comments)    CONFUSION AND PHYSICALLY SICK: Detergents, formaldehyde, any strong smells  . Penicillins Hives and Swelling    Has patient had a PCN reaction causing immediate rash, facial/tongue/throat swelling, SOB or lightheadedness with hypotension: Yes Has patient had a PCN reaction causing severe rash involving mucus membranes or skin necrosis: NO Has patient had a PCN reaction that required hospitalization already in hospital  Has patient had a PCN reaction occurring within the last 10 years: no If all of the above answers are "NO", then may proceed with Cephalosporin use.   . Sulfa Antibiotics Hives and Swelling  . Lorazepam Other (See Comments)    Restless legs   No current facility-administered medications on file prior to encounter.    Current Outpatient Medications on File Prior to Encounter  Medication Sig Dispense Refill  . acetaminophen (TYLENOL) 325 MG tablet Take 325-650 mg by mouth every 8 (eight) hours as needed for mild pain or moderate pain.    Marland Kitchen aspirin EC 81 MG tablet Take 81 mg by mouth daily.    . busPIRone (BUSPAR) 5 MG tablet Take 5 mg by mouth 3 (three) times daily.    . calcium-vitamin D (OSCAL WITH D) 250-125 MG-UNIT tablet Take 1 tablet by mouth daily.    Marland Kitchen esomeprazole (NEXIUM) 40 MG capsule Take 40 mg by mouth daily.     Marland Kitchen levothyroxine (SYNTHROID, LEVOTHROID) 50 MCG tablet Take 50 mcg by mouth at bedtime.     Marland Kitchen lisinopril (PRINIVIL,ZESTRIL) 5 MG tablet Take 5 mg by mouth daily.    . magnesium oxide (MAG-OX) 400 MG tablet Take 400 mg by mouth daily.    . metoprolol tartrate (LOPRESSOR) 25 MG tablet Take 50 mg by mouth daily.    . mirtazapine (REMERON) 7.5 MG tablet Take 7.5 mg by mouth at bedtime.    . Multiple Vitamins-Minerals (MULTIVITAMIN & MINERAL PO) Take 1 tablet by mouth daily.    . Potassium Hydroxide 10 % SOLN Take 7.5 mLs by mouth daily.    . pregabalin (LYRICA) 150 MG capsule Take 150-300 mg by mouth  See admin instructions. 150mg  in am, 300mg  at bedtime    . QUEtiapine (SEROQUEL) 25 MG tablet Take 25 mg by mouth at bedtime.    . ondansetron (ZOFRAN ODT) 8 MG disintegrating tablet 8mg  ODT q8 hours prn nausea (Patient not taking: Reported on 01/29/2017) 8 tablet 0  . pregabalin (LYRICA) 100 MG capsule Take 100 mg by mouth daily.       Objective: BP 121/62   Pulse 70   Temp 98 F (36.7 C) (Oral)   Resp (!) 22   Ht 4\' 3"  (1.295 m)   Wt 90 lb (40.8 kg)   SpO2 98%   BMI 24.33 kg/m  Exam: General: frail, elderly lady laying in bed w/  in place in mild distress Eyes: PERRLA, EOMI ENTM: dry lips and mucous membranes Neck: supple, non-tender, no LAD Cardiovascular: RRR no MRG Respiratory: increased work of breathing, wheezes appreciated bilaterally, diminished in bases Gastrointestinal: soft, non-tender, non-distended, +BS MSK: moves all extremities equally Derm: warm, dry, no rashes noted Neuro: alert to person only. No focal deficits,  strength 4/5 in upper and lower extremities bilaterally  Labs and Imaging: CBC BMET  Recent Labs  Lab 01/29/17 1306  WBC 6.5  HGB 11.4*  HCT 35.2*  PLT 147*   Recent Labs  Lab 01/29/17 1306  NA 143  K 3.9  CL 107  CO2 26  BUN 20  CREATININE 1.01*  GLUCOSE 135*  CALCIUM 9.1     BNP >3900  Dg Chest Portable 1 View  Result Date: 01/29/2017 CLINICAL DATA:  83 year old female status post fall while walking. No syncope reported. Dyspnea. EXAM: PORTABLE CHEST 1 VIEW COMPARISON:  03/01/2015 radiographs of the chest FINDINGS: The left heart border is obscured by confluent pulmonary opacity likely to represent a moderate to large left pleural effusion with compressive atelectasis. Trace right pleural effusion. Pulmonary vascular congestion is noted. The thoracic aorta is calcified and ectatic. Deviation of the trachea convex to the right due to tortuous thoracic aorta. No apparent fracture or pneumothorax. Small loose bodies are seen in the  left axillary pouch. IMPRESSION: 1. Bilateral pleural effusions, moderate to large on the left and trace on the right with pulmonary vascular congestion. 2. Aortic atherosclerosis. 3. No acute osseous appearing abnormality. Electronically Signed   By: Tollie Ethavid  Kwon M.D.   On: 01/29/2017 15:11     Tillman SersRiccio, Kathlynn Swofford C, DO 01/29/2017, 5:08 PM PGY-2, [place] FPTS Intern pager: [phone], text pages welcome

## 2017-01-30 ENCOUNTER — Encounter (HOSPITAL_COMMUNITY): Payer: Self-pay

## 2017-01-30 ENCOUNTER — Other Ambulatory Visit: Payer: Self-pay

## 2017-01-30 DIAGNOSIS — Z515 Encounter for palliative care: Secondary | ICD-10-CM

## 2017-01-30 DIAGNOSIS — N179 Acute kidney failure, unspecified: Secondary | ICD-10-CM

## 2017-01-30 DIAGNOSIS — Z7189 Other specified counseling: Secondary | ICD-10-CM

## 2017-01-30 DIAGNOSIS — J441 Chronic obstructive pulmonary disease with (acute) exacerbation: Secondary | ICD-10-CM

## 2017-01-30 LAB — BASIC METABOLIC PANEL
Anion gap: 14 (ref 5–15)
BUN: 20 mg/dL (ref 6–20)
CALCIUM: 8.3 mg/dL — AB (ref 8.9–10.3)
CHLORIDE: 100 mmol/L — AB (ref 101–111)
CO2: 28 mmol/L (ref 22–32)
Creatinine, Ser: 1.13 mg/dL — ABNORMAL HIGH (ref 0.44–1.00)
GFR, EST AFRICAN AMERICAN: 46 mL/min — AB (ref >60)
GFR, EST NON AFRICAN AMERICAN: 40 mL/min — AB (ref >60)
Glucose, Bld: 98 mg/dL (ref 65–99)
Potassium: 3 mmol/L — ABNORMAL LOW (ref 3.5–5.1)
Sodium: 142 mmol/L (ref 135–145)

## 2017-01-30 LAB — CBC
HEMATOCRIT: 30.2 % — AB (ref 36.0–46.0)
HEMOGLOBIN: 9.6 g/dL — AB (ref 12.0–15.0)
MCH: 29.2 pg (ref 26.0–34.0)
MCHC: 31.8 g/dL (ref 30.0–36.0)
MCV: 91.8 fL (ref 78.0–100.0)
Platelets: 127 10*3/uL — ABNORMAL LOW (ref 150–400)
RBC: 3.29 MIL/uL — ABNORMAL LOW (ref 3.87–5.11)
RDW: 16.4 % — AB (ref 11.5–15.5)
WBC: 4.9 10*3/uL (ref 4.0–10.5)

## 2017-01-30 LAB — TROPONIN I
TROPONIN I: 0.06 ng/mL — AB (ref <0.03)
Troponin I: 0.06 ng/mL (ref <0.03)
Troponin I: 0.04 ng/mL (ref <0.03)

## 2017-01-30 LAB — MRSA PCR SCREENING: MRSA by PCR: NEGATIVE

## 2017-01-30 MED ORDER — IPRATROPIUM-ALBUTEROL 0.5-2.5 (3) MG/3ML IN SOLN: 3.0000 mL | RESPIRATORY_TRACT | Status: DC

## 2017-01-30 MED ORDER — POTASSIUM CHLORIDE CRYS ER 20 MEQ PO TBCR
40.0000 meq | EXTENDED_RELEASE_TABLET | ORAL | Status: AC
  Administered 2017-01-30: 40 meq via ORAL
  Filled 2017-01-30: qty 2

## 2017-01-30 MED ORDER — FUROSEMIDE 10 MG/ML IJ SOLN
60.0000 mg | Freq: Every day | INTRAMUSCULAR | Status: DC
  Administered 2017-01-30: 60 mg via INTRAVENOUS
  Filled 2017-01-30: qty 6

## 2017-01-30 MED ORDER — IPRATROPIUM-ALBUTEROL 0.5-2.5 (3) MG/3ML IN SOLN
3.0000 mL | Freq: Three times a day (TID) | RESPIRATORY_TRACT | Status: DC
  Administered 2017-01-30 – 2017-01-31 (×4): 3 mL via RESPIRATORY_TRACT
  Filled 2017-01-30 (×4): qty 3

## 2017-01-30 NOTE — Progress Notes (Signed)
Initial Nutrition Assessment  DOCUMENTATION CODES:   Severe malnutrition in context of chronic illness  INTERVENTION:   -RD will follow for diet advancement and supplement as appropriate based upon goals of care discussions and SLP evaluation  NUTRITION DIAGNOSIS:   Severe Malnutrition related to chronic illness(dementia) as evidenced by moderate fat depletion, severe fat depletion, moderate muscle depletion, severe muscle depletion.  GOAL:   Patient will meet greater than or equal to 90% of their needs  MONITOR:   PO intake, Supplement acceptance, Diet advancement, Labs, Weight trends, Skin, I & O's  REASON FOR ASSESSMENT:   Consult Assessment of nutrition requirement/status  ASSESSMENT:   Pamela Peterson is a 83 y.o. female presenting with fall and SOB. PMH is significant for HFrEF, dementia, depression/anxiety, hearing loss  Pt admitted with dyspnea.   Pt sitting in recliner chair at time of visit and hard to arouse. No family at bedside to provide additional hx.   Pt NPO; noted SLP evaluation pending. Pt consumed a bite of graham cracker, 100% of pudding cup, and a sip of Ensure. Pt denies any swallowing difficulties PTA.   Reviewed wt hx; noted UBW around 95#. Pt has had wt loss over a year ago, however, no recent wt readings to assess more acute wt loss.   Per H&P, pt with general decline in health over the past few months. Suspect poor oral intake PTA. Given pt's advanced age and dementia, nutrition support (ex temporary vs permanent artifical feeding) would not benefit pt.    Medications reviewed and include lasix and remeron.   Labs reviewed: K: 3.0.    NUTRITION - FOCUSED PHYSICAL EXAM:    Most Recent Value  Orbital Region  Severe depletion  Upper Arm Region  Severe depletion  Thoracic and Lumbar Region  Unable to assess  Buccal Region  Moderate depletion  Temple Region  Severe depletion  Clavicle Bone Region  Severe depletion  Clavicle and Acromion Bone  Region  Severe depletion  Scapular Bone Region  Severe depletion  Dorsal Hand  Severe depletion  Patellar Region  Severe depletion  Anterior Thigh Region  Moderate depletion  Posterior Calf Region  Moderate depletion  Edema (RD Assessment)  Mild  Hair  Reviewed  Eyes  Reviewed  Mouth  Unable to assess  Skin  Reviewed  Nails  Reviewed      Diet Order:  Diet NPO time specified  EDUCATION NEEDS:   Not appropriate for education at this time  Skin:  Skin Assessment: Reviewed RN Assessment  Last BM:  01/30/17  Height:   Ht Readings from Last 1 Encounters:  01/29/17 4\' 3"  (1.295 m)    Weight:   Wt Readings from Last 1 Encounters:  01/30/17 82 lb (37.2 kg)    Ideal Body Weight:  38.6 kg  BMI:  Body mass index is 22.17 kg/m.  Estimated Nutritional Needs:   Kcal:  1000-1200  Protein:  40-65 grams  Fluid:  > 1 L    Deaven Barron A. Mayford KnifeWilliams, RD, LDN, CDE Pager: [phone] After hours Pager: [phone]

## 2017-01-30 NOTE — Progress Notes (Signed)
Family Medicine Teaching Service Daily Progress Note Intern Pager: [phone]  Patient name: Pamela Peterson Medical record number: [id number] Date of birth: 10/03/1919 Age: 83 y.o. Gender: female  Primary Care Provider: Leola BrazilEverly, Rebecca B, DO Consultants: none Code Status: DNR  Pt Overview and Major Events to Date:  Admitted to FPTS on 01/29/17  Assessment and Plan: Pamela Peterson is a 83 y.o. female presenting with fall and SOB. PMH is significant for HFrEF, dementia, depression/anxiety, hearing loss  Dyspnea Likely 2/2 CHF exacerbation. Troponins elevated but with flat trend at 0.05>0.06>0.06, so less likely cardiac in origin. Per daughter, POA, patient is frequently admitted to [facility] for CHF exacerbations, per patient's daughter last echo in October EF 20-25%. Patient's BNP elevated to 3918.2, no previous readings to compare, pitting edema in LE bilaterally. Pleural effusions seen on CXR. No signs of infiltrate on CXR, no leukocytosis, and no fever making pneumonia unlikely. Increased O2 requirement, on 2L baseline but currently satting 94% on 5L O2.  -vitals per floor w/ pulse ox -continue IV diuresis -supplemental O2 as needed to keep sats >92% -monitor for signs of infection -SLP consult to assess swallowing function as frequent pneumonia may be related to aspiration -EKG to r/o cardiac cause -duoneb treatment  -strict I's and O's  Fall Per EMS patient fell today, no head trama noted on admission, no concerning neurologic findings -PT/OT -up with assistance -fall precautions  Hypokalemia K of 3.0 today. K on admission of 3.9.  -replace with Kdur as needed -monitor with daily BMP  AKI SCr elevated to 1.13 today. Elevated to 1.01 on admission. Baseline appears to be ~0.6. -continue to monitor -need to be careful with fluid hydration given history of CHF  HTN Home medication: 5 mg lisinopril. Slightly hypertensive today at 157/44.  -continue home  medication  Hypothyroidism Home medication: 50mcg synthroid daily -continue synthroid  Depression/anxiety Home medications: seroquil, buspar, and remeron  -continue home medications  Severe protein-calorie malnutrition Patient cachectic appearing -nutrition consulted, appreciate recommendations   Disposition Discussed care of patient with her daughter. Explained that patient is likely nearing end stage of disease process as she is re-admitted once a month. Patient's daughter explained that she noticed a decline in her dementia recently as well.  -hospice consult ordered. Discussed with patient's daughter prior to putting in referral   FEN/GI: pending SLP eval Prophylaxis: lovenox  Disposition: [facility]  Subjective:  Patient today states she is doing well. Patient is currently on 4L O2 and states she is breathing well. Denies chest pain or pain. Nursing had no concerns. Daughter notes that patient is declining in dementia. Is interested in hospice but also wants more information on hospice care.   Objective: Temp:  [98 F (36.7 C)] 98 F (36.7 C) (12/04 0516) Pulse Rate:  [35-88] 75 (12/04 1029) Resp:  [18-28] 18 (12/04 0516) BP: (121-172)/(44-107) 156/52 (12/04 0842) SpO2:  [92 %-100 %] 95 % (12/04 0842) Weight:  [82 lb (37.2 kg)-90 lb (40.8 kg)] 82 lb (37.2 kg) (12/04 0516) Physical Exam: General: awake and alert, NAD Cardiovascular: RRR, no MRG Respiratory: crackles throughout, no increased work of breathing, no retractions  Abdomen: soft, non tender, bowel sounds normal x 4 quadrants  Extremities: no edema, non tender   Laboratory: Recent Labs  Lab 01/29/17 1306 01/30/17 0501  WBC 6.5 4.9  HGB 11.4* 9.6*  HCT 35.2* 30.2*  PLT 147* 127*   Recent Labs  Lab 01/29/17 1306 01/30/17 0501  NA 143 142  K 3.9 3.0*  CL 107 100*  CO2 26 28  BUN 20 20  CREATININE 1.01* 1.13*  CALCIUM 9.1 8.3*  PROT 6.7  --   BILITOT 1.1  --   ALKPHOS 62  --   ALT 14  --    AST 39  --   GLUCOSE 135* 98    Imaging/Diagnostic Tests: Dg Chest Portable 1 View  Result Date: 01/29/2017 CLINICAL DATA:  83 year old female status post fall while walking. No syncope reported. Dyspnea. EXAM: PORTABLE CHEST 1 VIEW COMPARISON:  03/01/2015 radiographs of the chest FINDINGS: The left heart border is obscured by confluent pulmonary opacity likely to represent a moderate to large left pleural effusion with compressive atelectasis. Trace right pleural effusion. Pulmonary vascular congestion is noted. The thoracic aorta is calcified and ectatic. Deviation of the trachea convex to the right due to tortuous thoracic aorta. No apparent fracture or pneumothorax. Small loose bodies are seen in the left axillary pouch. IMPRESSION: 1. Bilateral pleural effusions, moderate to large on the left and trace on the right with pulmonary vascular congestion. 2. Aortic atherosclerosis. 3. No acute osseous appearing abnormality. Electronically Signed   By: Tollie Ethavid  Kwon M.D.   On: 01/29/2017 15:11     Oralia ManisAbraham, Matix Henshaw, DO 01/30/2017, 12:01 PM PGY-1, [facility] Family Medicine FPTS Intern pager: [phone], text pages welcome

## 2017-01-30 NOTE — Discharge Summary (Signed)
Family Medicine Teaching [facility] Discharge Summary  Patient name: Pamela Peterson Medical record number: [id number] Date of birth: 02/16/1920 Age: 83 y.o. Gender: female Date of Admission: 01/29/2017  Date of Discharge: 01/31/17 Admitting Physician: Tobey GrimJeffrey H Orpah Hausner, MD  Primary Care Provider: Leola BrazilEverly, Rebecca B, DO Consultants: palliative care  Indication for Hospitalization: Dyspnea   Discharge Diagnoses/Problem List:  Dyspnea Fall Hypokalemia AKI HTN Hypothyroidism Depression/anxiety Severe protein-calorie malnutrition   Disposition: SNF w/ hospice   Discharge Condition: stable  Discharge Exam:  Patient refusing exam.   Brief Hospital Course:  Pamela Peterson is a 83 y.o. female presenting with SOB. Patient has history of dementia and therefor history and subjective opinion of complaints may not be accurate. Patient is a [facility]. Per patient's daughter patient has frequent, almost monthly, admission to [facility] for fluid overload and pneumonia. CXR on admission patient had bilateral pleural effusions, but no acute changes. While admitted patient was placed on increased supplemental O2, home O2 2L and was put on 6L O2 on admission but transitioned back to home O2 prior to discharge. Patient received IV lasix, 60mg  daily, and improved. Patient was transitioned to PO lasix prior to discharge. Patient's mental status waxed and waned during admission. At times patient very pleasant, and at other times refusing care.    Issues for Follow Up:  1. Discharged on prn lasix for SOB 2. Monitor lung sounds, patient refusing exams  3. Discharged with oral lasix. Alternate between 20 and 40mg .  4. If gaining more than 2lbs every 2 days take an extra dose of 40mg  lasix.  5. Stopped lyrica, patient did not need during hospitalization. Given kidney function discontinued. Could consider restarting at a lower dose.   Significant Procedures: none    Significant Labs and Imaging:  Recent Labs  Lab 01/29/17 1306 01/30/17 0501  WBC 6.5 4.9  HGB 11.4* 9.6*  HCT 35.2* 30.2*  PLT 147* 127*   Recent Labs  Lab 01/29/17 1306 01/30/17 0501 01/31/17 0447  NA 143 142 144  K 3.9 3.0* 3.6  CL 107 100* 102  CO2 26 28 32  GLUCOSE 135* 98 120*  BUN 20 20 26*  CREATININE 1.01* 1.13* 1.15*  CALCIUM 9.1 8.3* 8.3*  ALKPHOS 62  --   --   AST 39  --   --   ALT 14  --   --   ALBUMIN 3.8  --   --     Ref. Range 01/29/2017 16:55 01/29/2017 23:44 01/30/2017 05:01 01/30/2017 10:16  Troponin I Latest Ref Range: <0.03 ng/mL 0.05 (HH) 0.06 (HH) 0.06 (HH) 0.04 (HH)   Dg Chest Portable 1 View  Result Date: 01/29/2017 CLINICAL DATA:  83 year old female status post fall while walking. No syncope reported. Dyspnea. EXAM: PORTABLE CHEST 1 VIEW COMPARISON:  03/01/2015 radiographs of the chest FINDINGS: The left heart border is obscured by confluent pulmonary opacity likely to represent a moderate to large left pleural effusion with compressive atelectasis. Trace right pleural effusion. Pulmonary vascular congestion is noted. The thoracic aorta is calcified and ectatic. Deviation of the trachea convex to the right due to tortuous thoracic aorta. No apparent fracture or pneumothorax. Small loose bodies are seen in the left axillary pouch. IMPRESSION: 1. Bilateral pleural effusions, moderate to large on the left and trace on the right with pulmonary vascular congestion. 2. Aortic atherosclerosis. 3. No acute osseous appearing abnormality. Electronically Signed   By: Tollie Ethavid  Kwon M.D.   On: 01/29/2017 15:11  Results/Tests Pending at Time of Discharge:  Unresulted Labs (From admission, onward)   Start     Ordered   01/31/17 0500  Basic metabolic panel  Daily,   R     01/30/17 1154      Discharge Medications:  Allergies as of 01/31/2017      Reactions   Other Shortness Of Breath, Other (See Comments)   CONFUSION AND PHYSICALLY SICK: Detergents,  formaldehyde, any strong smells   Penicillins Hives, Swelling   Has patient had a PCN reaction causing immediate rash, facial/tongue/throat swelling, SOB or lightheadedness with hypotension: Yes Has patient had a PCN reaction causing severe rash involving mucus membranes or skin necrosis: NO Has patient had a PCN reaction that required hospitalization already in hospital  Has patient had a PCN reaction occurring within the last 10 years: no If all of the above answers are "NO", then may proceed with Cephalosporin use.   Sulfa Antibiotics Hives, Swelling   Lorazepam Other (See Comments)   Restless legs      Medication List    STOP taking these medications   pregabalin 100 MG capsule Commonly known as:  LYRICA   pregabalin 150 MG capsule Commonly known as:  LYRICA     TAKE these medications   acetaminophen 325 MG tablet Commonly known as:  TYLENOL Take 325-650 mg by mouth every 8 (eight) hours as needed for mild pain or moderate pain.   aspirin EC 81 MG tablet Take 81 mg by mouth daily.   busPIRone 5 MG tablet Commonly known as:  BUSPAR Take 5 mg by mouth 3 (three) times daily.   calcium-vitamin D 250-125 MG-UNIT tablet Commonly known as:  OSCAL WITH D Take 1 tablet by mouth daily.   esomeprazole 40 MG capsule Commonly known as:  NEXIUM Take 40 mg by mouth daily.   furosemide 40 MG tablet Commonly known as:  LASIX Alternate between 40 and 20 mg daily (40mg  every M/W/F, 20 mg on T/Th/Sat/Sun)   levothyroxine 50 MCG tablet Commonly known as:  SYNTHROID, LEVOTHROID Take 50 mcg by mouth at bedtime.   lisinopril 5 MG tablet Commonly known as:  PRINIVIL,ZESTRIL Take 5 mg by mouth daily.   magnesium oxide 400 MG tablet Commonly known as:  MAG-OX Take 400 mg by mouth daily.   metoprolol tartrate 25 MG tablet Commonly known as:  LOPRESSOR Take 50 mg by mouth daily.   mirtazapine 7.5 MG tablet Commonly known as:  REMERON Take 7.5 mg by mouth at bedtime.    MULTIVITAMIN & MINERAL PO Take 1 tablet by mouth daily.   ondansetron 8 MG disintegrating tablet Commonly known as:  ZOFRAN ODT 8mg  ODT q8 hours prn nausea   Potassium Hydroxide 10 % Soln Take 7.5 mLs by mouth daily.   QUEtiapine 25 MG tablet Commonly known as:  SEROQUEL Take 25 mg by mouth at bedtime.       Discharge Instructions: Please refer to Patient Instructions section of EMR for full details.  Patient was counseled important signs and symptoms that should prompt return to medical care, changes in medications, dietary instructions, activity restrictions, and follow up appointments.   Follow-Up Appointments: Follow-up Information    Leola Brazil, DO. Schedule an appointment as soon as possible for a visit in 2 day(s).   Specialty:  Internal Medicine Why:  Please call and schedule a follow up with your PCP within 1 week of discharge  Contact information: [address] [place] [address] [phone]  Oralia ManisAbraham, Sherin, DO 01/31/2017, 3:16 PM PGY-1, [facility]  Family Medicine Teaching Service  Discharge Note : Attending Renold DonJeff Shayla Heming MD Pager [phone] Inpatient Team Pager:  [phone]  I have reviewed this patient and the patient's chart and have discussed discharge planning with the resident at the time of discharge. I agree with the discharge plan as above.  Of note -- troponins elevated with T-wave inversions noted.  Patient being discharged on hospice.  Agree with non-intervention.

## 2017-01-30 NOTE — Evaluation (Signed)
Clinical/Bedside Swallow Evaluation Patient Details  Name: Pamela Peterson MRN: [id number] Date of Birth: 10/03/1919  Today's Date: 01/30/2017 Time: SLP Start Time (ACUTE ONLY): 0944 SLP Stop Time (ACUTE ONLY): 0954 SLP Time Calculation (min) (ACUTE ONLY): 10 min  Past Medical History:  Past Medical History:  Diagnosis Date  . Anxiety   . CHF (congestive heart failure) (HCC)   . Hyperlipidemia   . Hypertension   . Hypothyroid   . Osteoporosis    Past Surgical History:  Past Surgical History:  Procedure Laterality Date  . BLADDER SURGERY     HPI:  Pamela SlaterMary Haynesis a 83 y.o.femalewho presents with a fall. PMH significant for HTN, HLD, anxiety, osteoporosis, hypothyroidism, hx of hyponatremia and dehydration due to esophageal dysmotility syndrome.CXR bilateral pleural effusions, moderate to large on the left and trace on the right with pulmonary vascular congestion. Esophagram 03/02/15 Severe esophageal dysmotility.     Assessment / Plan / Recommendation Clinical Impression  SLP discussed and pt aware of her history of significant esophageal dysmotility and need to remain upright following meals. Mastication efficient and transit timely, no oral residue. Subjective assessment of pharyngeal swallow is within normal limits; no indications of aspiration identified across textures/liquids. Aspiration post prandial would be primary concern. Reiterated esophageal precautions and recommend regular texture/thin liquids, pills with thin. No further ST needed.    SLP Visit Diagnosis: Dysphagia, unspecified (R13.10)    Aspiration Risk  (mild-mod)    Diet Recommendation Regular;Thin liquid   Liquid Administration via: Cup;Straw Medication Administration: Whole meds with liquid Supervision: Patient able to self feed Compensations: Slow rate;Small sips/bites Postural Changes: Remain upright for at least 30 minutes after po intake;Seated upright at 90 degrees    Other  Recommendations Oral Care  Recommendations: Oral care BID   Follow up Recommendations None      Frequency and Duration            Prognosis        Swallow Study   General HPI: Pamela SlaterMary Haynesis a 83 y.o.femalewho presents with a fall. PMH significant for HTN, HLD, anxiety, osteoporosis, hypothyroidism, hx of hyponatremia and dehydration due to esophageal dysmotility syndrome.CXR bilateral pleural effusions, moderate to large on the left and trace on the right with pulmonary vascular congestion. Esophagram 03/02/15 Severe esophageal dysmotility.   Type of Study: Bedside Swallow Evaluation Previous Swallow Assessment: (see HPI) Diet Prior to this Study: NPO Temperature Spikes Noted: No Respiratory Status: Nasal cannula History of Recent Intubation: No Behavior/Cognition: Alert;Cooperative;Pleasant mood Oral Cavity Assessment: Dry Oral Care Completed by SLP: Yes Oral Cavity - Dentition: Adequate natural dentition(missing some posterior lower) Vision: Functional for self-feeding Self-Feeding Abilities: Able to feed self Patient Positioning: Upright in chair Baseline Vocal Quality: Normal Volitional Cough: Strong Volitional Swallow: Able to elicit    Oral/Motor/Sensory Function Overall Oral Motor/Sensory Function: Within functional limits   Ice Chips Ice chips: Not tested   Thin Liquid Thin Liquid: Within functional limits Presentation: Cup;Straw    Nectar Thick Nectar Thick Liquid: Not tested   Honey Thick Honey Thick Liquid: Not tested   Puree Puree: Within functional limits   Solid   GO   Solid: Within functional limits        Royce MacadamiaLitaker, Nada Godley Willis 01/30/2017,12:06 PM   Breck CoonsLisa Willis Lonell FaceLitaker M.Ed [employer]-SLP Pager [phone]

## 2017-01-30 NOTE — Evaluation (Signed)
Physical Therapy Evaluation Patient Details Name: Pamela Peterson MRN: [id number] DOB: 08/10/1919 Today's Date: 01/30/2017   History of Present Illness  Pt is a 83 y.o. female with known HFrEF (with Echo 20-25% in October), dementia, hearing impairment, who presents on 01/29/17 post-fall [facility] and SOB for 1 day duration; worked up for CHF exacerbation.      Clinical Impression  Pt presents with generalized weakness and an overall decrease in functional mobility secondary to above. PTA, [facility]; inconsistent historian, but reports mod indep with RW and intermittent assist for ADLs from staff. Today, pt required minA for all mobility and use of RW for ambulation in room. Pt would benefit from continued acute PT services to maximize functional mobility and independence prior to d/cwith SNF-level therapies.     Follow Up Recommendations SNF;Supervision for mobility/OOB    Equipment Recommendations  None recommended by PT    Recommendations for Other Services OT consult     Precautions / Restrictions Precautions Precautions: Fall Restrictions Weight Bearing Restrictions: No      Mobility  Bed Mobility Overal bed mobility: Needs Assistance Bed Mobility: Supine to Sit     Supine to sit: Min guard        Transfers Overall transfer level: Needs assistance Equipment used: Rolling walker (2 wheeled) Transfers: Sit to/from [employer]Stand;Stand Pivot Transfers Sit to Stand: Min assist Stand pivot transfers: Min assist       General transfer comment: MinA to maintain balance using RW to stand pivot to [facility] as pt incontinent of urine without time to walk to bathroom  Ambulation/Gait Ambulation/Gait assistance: Min assist Ambulation Distance (Feet): 10 Feet Assistive device: Rolling walker (2 wheeled) Gait Pattern/deviations: Step-to pattern;Shuffle;Trunk flexed Gait velocity: Decreased Gait velocity interpretation: <1.8 ft/sec, indicative of risk for  recurrent falls General Gait Details: Short, shuffling steps with RW and minA to maintain balance while ambulating in room  Stairs            Wheelchair Mobility    Modified Rankin (Stroke Patients Only)       Balance Overall balance assessment: Needs assistance   Sitting balance-Leahy Scale: Fair       Standing balance-Leahy Scale: Poor Standing balance comment: Reliant on UE support                             Pertinent Vitals/Pain Pain Assessment: Faces Faces Pain Scale: No hurt    Home Living Family/patient expects to be discharged to:: Skilled nursing facility                 Additional Comments: [facility]; agreeable for rehab at SNF    Prior Function Level of Independence: Needs assistance   Gait / Transfers Assistance Needed: Unsure if pt reliable historian. Reports mod indep for ambulation with RW; but later states "someone always walks with me". Indep with dressing and toileting. Reports intermittent assist for bathing.           Hand Dominance        Extremity/Trunk Assessment   Upper Extremity Assessment Upper Extremity Assessment: Generalized weakness    Lower Extremity Assessment Lower Extremity Assessment: Generalized weakness    Cervical / Trunk Assessment Cervical / Trunk Assessment: Kyphotic;Other exceptions Cervical / Trunk Exceptions: Appears to have scoliosis  Communication   Communication: HOH  Cognition Arousal/Alertness: Awake/alert Behavior During Therapy: WFL for tasks assessed/performed Overall Cognitive Status: History of  cognitive impairments - at baseline Area of Impairment: Orientation;Memory;Problem solving                 Orientation Level: Disoriented to;Place;Time   Memory: Decreased short-term memory       Problem Solving: Decreased initiation;Requires verbal cues        General Comments General comments (skin integrity, edema, etc.): VSS on RA     Exercises     Assessment/Plan    PT Assessment Patient needs continued PT services  PT Problem List Decreased strength;Decreased activity tolerance;Decreased balance;Decreased mobility       PT Treatment Interventions DME instruction;Gait training;Stair training;Functional mobility training;Therapeutic activities;Therapeutic exercise;Balance training;Patient/family education    PT Goals (Current goals can be found in the Care Plan section)  Acute Rehab PT Goals Patient Stated Goal: Return home PT Goal Formulation: With patient Time For Goal Achievement: 02/13/17 Potential to Achieve Goals: Good    Frequency Min 2X/week   Barriers to discharge        Co-evaluation               AM-PAC PT "6 Clicks" Daily Activity  Outcome Measure Difficulty turning over in bed (including adjusting bedclothes, sheets and blankets)?: A Little Difficulty moving from lying on back to sitting on the side of the bed? : A Little Difficulty sitting down on and standing up from a chair with arms (e.g., wheelchair, bedside commode, etc,.)?: Unable Help needed moving to and from a bed to chair (including a wheelchair)?: A Little Help needed walking in hospital room?: A Little Help needed climbing 3-5 steps with a railing? : A Lot 6 Click Score: 15    End of Session Equipment Utilized During Treatment: Gait belt Activity Tolerance: Patient tolerated treatment well Patient left: in chair;with call bell/phone within reach;with chair alarm set Nurse Communication: Mobility status PT Visit Diagnosis: Other abnormalities of gait and mobility (R26.89);Unsteadiness on feet (R26.81)    Time: [id number] PT Time Calculation (min) (ACUTE ONLY): 29 min   Charges:   PT Evaluation $PT Eval Moderate Complexity: 1 Mod PT Treatments $Therapeutic Activity: 8-22 mins   PT G Codes:        Ina HomesJaclyn Bergen Magner, PT, DPT Acute Rehab Services  Pager: [phone]  Malachy ChamberJaclyn L Lorn Butcher 01/30/2017, 10:17 AM

## 2017-01-30 NOTE — Consult Note (Signed)
Consultation Note Date: 01/30/2017   Patient Name: Pamela Peterson  DOB: Oct 07, 1919  MRN: [id number]  Age / Sex: 83 y.o., female  PCP: Nicola Girt, DO Referring Physician: Alveda Reasons, MD  Reason for Consultation: Establishing goals of care and Hospice Evaluation  HPI/Patient Profile: 83 y.o. female  with past medical history of dementia, CHF w/ EF 20-25% in Oct, multiple falls, HTN, HLD, anxiety, esophageal dysmotility, recurrent pna, and oxygen dependent admitted on 01/29/2017 with fall and increasing lethargy. CXR revealed bilateral pleural effusions. BNP 4000. Patient initially tachypneic and short of breath. Signs of fluid overload on physical assessment. Treated for CHF exacerbation with lasix.  PMT consulted to discuss hospice options with daughter.    Clinical Assessment and Goals of Care: Met w/ patient at bedside. Reports she feels better than she did on admission. Tells me she is eating well, unsure if true d/t dementia. Not oriented to time and easily confused but understands she is in the hospital for fluid overload. Wants to have goals of care conversation with daughter, Juliann Pulse. Reached out to [place] via phone, will meet with her 12/5 at 11 am to discuss possibility of Hospice following at ALF.  Primary Decision Maker NEXT OF KIN daughter Juliann Pulse    SUMMARY OF RECOMMENDATIONS   -PMT will follow up with daughter tomorrow to discuss hospice following at ALF  Code Status/Advance Care Planning:  DNR   Symptom Management:   None  Palliative Prophylaxis:   Aspiration, Delirium Protocol and Frequent Pain Assessment  Additional Recommendations (Limitations, Scope, Preferences):  DNR, otherwise continue current care  Psycho-social/Spiritual:   Desire for further Chaplaincy support:no  Additional Recommendations: Education on Hospice  Prognosis:   < 6 months  Discharge Planning: To Be Determined      Primary  Diagnoses: Present on Admission: **None**   I have reviewed the medical record, interviewed the patient and family, and examined the patient. The following aspects are pertinent.  Past Medical History:  Diagnosis Date  . Anxiety   . CHF (congestive heart failure) ([place])   . Hyperlipidemia   . Hypertension   . Hypothyroid   . Osteoporosis    Social History   Socioeconomic History  . Marital status: Widowed    Spouse name: None  . Number of children: None  . Years of education: None  . Highest education level: None  Social Needs  . Financial resource strain: None  . Food insecurity - worry: None  . Food insecurity - inability: None  . Transportation needs - medical: None  . Transportation needs - non-medical: None  Occupational History  . None  Tobacco Use  . Smoking status: Never Smoker  . Smokeless tobacco: Never Used  Substance and Sexual Activity  . Alcohol use: No  . Drug use: No  . Sexual activity: None  Other Topics Concern  . None  Social History Narrative  . None   Family History  Problem Relation Age of Onset  . Heart attack Brother   . COPD Sister   . Cancer Brother   . Tuberculosis Mother   . Heart attack Father   . Alcohol abuse Father    Scheduled Meds: . aspirin EC  81 mg Oral Daily  . busPIRone  5 mg Oral TID  . calcium-vitamin D  0.5 tablet Oral Daily  . enoxaparin (LOVENOX) injection  30 mg Subcutaneous Q24H  . furosemide  60 mg Intravenous Daily  . ipratropium-albuterol  3 mL Nebulization TID  . levothyroxine  50 mcg Oral QHS  . metoprolol tartrate  50 mg Oral Daily  . mirtazapine  7.5 mg Oral QHS  . pantoprazole  40 mg Oral Daily  . potassium chloride  40 mEq Oral Q2H  . QUEtiapine  25 mg Oral QHS   Continuous Infusions: PRN Meds:.acetaminophen Allergies  Allergen Reactions  . Other Shortness Of Breath and Other (See Comments)    CONFUSION AND PHYSICALLY SICK: Detergents, formaldehyde, any strong smells  . Penicillins Hives and  Swelling    Has patient had a PCN reaction causing immediate rash, facial/tongue/throat swelling, SOB or lightheadedness with hypotension: Yes Has patient had a PCN reaction causing severe rash involving mucus membranes or skin necrosis: NO Has patient had a PCN reaction that required hospitalization already in hospital  Has patient had a PCN reaction occurring within the last 10 years: no If all of the above answers are "NO", then may proceed with Cephalosporin use.   . Sulfa Antibiotics Hives and Swelling  . Lorazepam Other (See Comments)    Restless legs   Review of Systems  Unable to perform ROS: Dementia    Physical Exam  Constitutional: She is cooperative. No distress.  Elderly, frail appearing  HENT:  Head: Normocephalic and atraumatic.  Cardiovascular: Normal rate and intact distal pulses.  Pulmonary/Chest: Effort normal and breath sounds normal. No accessory muscle usage. No respiratory distress.  Abdominal: Soft. Bowel sounds are normal. She exhibits no distension. There is no tenderness.  Neurological: She is alert.  Oriented to self and situation, disoriented to time and place  Skin: Skin is warm and dry.  Psychiatric: She has a normal mood and affect. Her speech is normal and behavior is normal. Cognition and memory are impaired.    Vital Signs: BP (!) 156/52 (BP Location: Right Arm)   Pulse 75   Temp 98 F (36.7 C) (Oral)   Resp 18   Ht 4' 3"  (1.295 m)   Wt 37.2 kg (82 lb)   SpO2 95%   BMI 22.17 kg/m  Pain Assessment: No/denies pain       SpO2: SpO2: 95 % O2 Device:SpO2: 95 % O2 Flow Rate: .O2 Flow Rate (L/min): 3 L/min  IO: Intake/output summary:   Intake/Output Summary (Last 24 hours) at 01/30/2017 1309 Last data filed at 01/30/2017 0942 Gross per 24 hour  Intake 240 ml  Output 650 ml  Net -410 ml    LBM: Last BM Date: 01/30/17 Baseline Weight: Weight: 40.8 kg (90 lb) Most recent weight: Weight: 37.2 kg (82 lb)     Palliative Assessment/Data:  50%    Time Total: 30 minutes Greater than 50%  of this time was spent counseling and coordinating care related to the above assessment and plan.  Juel Burrow, DNP, AGNP-C Palliative Medicine Team [phone]

## 2017-01-30 NOTE — Clinical Social Work Note (Signed)
Clinical Social Work Assessment  Patient Details  Name: Pamela Peterson MRN: [id number] Date of Birth: 07/02/1919  Date of referral:  01/30/17               Reason for consult:  Discharge Planning                Permission sought to share information with:  Facility Medical sales representativeContact Representative, Family Supports Permission granted to share information::  Yes, Verbal Permission Granted  Name::     Pamela RampKathy Peterson  Agency::  [facility]  Relationship::  Daughter  Contact Information:  [phone]  Housing/Transportation Living arrangements for the past 2 months:  Assisted Living Facility Source of Information:  Medical Team, Adult Children Patient Interpreter Needed:  None Criminal Activity/Legal Involvement Pertinent to Current Situation/Hospitalization:  No - Comment as needed Significant Relationships:  Adult Children Lives with:  Facility Resident Do you feel safe going back to the place where you live?  No Need for family participation in patient care:  Yes (Comment)  Care giving concerns:  [facility] is a [facility]. PT recommending SNF once medically stable for discharge.   Social Worker assessment / plan:  Patient not fully oriented and sleeping during assessment. Daughter at bedside. CSW introduced role and explained that PT recommendations would be discussed. Patient's daughter confirmed she is a resident at [facility] and has been there since March. Patient's daughter prefers for her to return there rather than SNF. [facility] in the past year and according to patient's daughter it was not the best experience. Patient's daughter plans to work out more care at ALF so she can return to where she is familiar with the people, surroundings, etc. Palliative consulted today. No further concerns. CSW encouraged patient's daughter to contact CSW as needed. CSW will continue to follow patient and her daughter for support  and facilitate discharge back to ALF once medically stable.  Employment status:  Retired Database administratornsurance information:  Managed Medicare PT Recommendations:  Skilled Nursing Facility Information / Referral to community resources:  Other (Comment Required)(Plan is to return to ALF.)  Patient/Family's Response to care:  Patient not fully oriented and sleeping during assessment. Patient's daughter prefers for her to return to ALF rather than SNF. Patient's daughter supportive and involved in patient's care. Patient appreciated social work intervention.  Patient/Family's Understanding of and Emotional Response to Diagnosis, Current Treatment, and Prognosis:  Patient not fully oriented and sleeping during assessment. Patient's daughter has a good understanding of the reason for admission and PT recommendations but prefers for her to return to familiar surroundings. Patient's daughter appears happy with hospital care.  Emotional Assessment Appearance:  Appears stated age Attitude/Demeanor/Rapport:  Unable to Assess Affect (typically observed):  Unable to Assess Orientation:  Oriented to Self, Oriented to Place, Oriented to Situation Alcohol / Substance use:  Never Used Psych involvement (Current and /or in the community):  No (Comment)  Discharge Needs  Concerns to be addressed:  Care Coordination Readmission within the last 30 days:  No Current discharge risk:  Dependent with Mobility Barriers to Discharge:  Continued Medical Work up   Pamela LinerSarah C Reneisha Stilley, LCSW 01/30/2017, 12:27 PM

## 2017-01-30 NOTE — Evaluation (Signed)
Occupational Therapy Evaluation Patient Details Name: Pamela Peterson MRN: [id number] DOB: 03/19/1919 Today's Date: 01/30/2017    History of Present Illness Pt is a 83 y.o. female with known HFrEF (with Echo 20-25% in October), dementia, hearing impairment, who presents on 01/29/17 post-fall [facility] at [facility] and SOB for 1 day duration; worked up for CHF exacerbation.     Clinical Impression   Pt resides in an ALF. Her daughter reports she has been in the hospital [facility] monthly since February and gradually weakened since that time. Sometimes pt remains in her room in her pajamas because she lacks the endurance for more activity. Pt can take herself to the bathroom and walk with a rollator to the dining room once the staff places her on her 02 tank. She is assisted with ADL. Will follow acutely.     Follow Up Recommendations  Home health OT;Supervision/Assistance - 24 hour(at ALF)    Equipment Recommendations       Recommendations for Other Services       Precautions / Restrictions Precautions Precautions: Fall Restrictions Weight Bearing Restrictions: No      Mobility Bed Mobility   Transfers Overall transfer level: Needs assistance Equipment used: Rolling walker (2 wheeled) Transfers: Sit to/from Stand Sit to Stand: Min assist        General transfer comment: min assist to rise and steady with RW    Balance Overall balance assessment: Needs assistance   Sitting balance-Leahy Scale: Fair       Standing balance-Leahy Scale: Poor Standing balance comment: Reliant on UE support                           ADL either performed or assessed with clinical judgement   ADL Overall ADL's : At baseline                                       General ADL Comments: on 2.5L     Vision Baseline Vision/History: Wears glasses Wears Glasses: At all times Patient Visual Report: No change from baseline       Perception     Praxis       Pertinent Vitals/Pain Pain Assessment: Faces Faces Pain Scale: Hurts little more Pain Location: buttocks Pain Descriptors / Indicators: Sore Pain Intervention(s): Monitored during session;Repositioned     Hand Dominance Right   Extremity/Trunk Assessment Upper Extremity Assessment Upper Extremity Assessment: Generalized weakness(arthritic changes, wide range reach impeded by kyphosis)   Lower Extremity Assessment Lower Extremity Assessment: Defer to PT evaluation   Cervical / Trunk Assessment Cervical / Trunk Assessment: Kyphotic;Other exceptions Cervical / Trunk Exceptions: Appears to have scoliosis   Communication Communication Communication: HOH   Cognition Arousal/Alertness: Awake/alert Behavior During Therapy: WFL for tasks assessed/performed Overall Cognitive Status: History of cognitive impairments - at baseline Area of Impairment: Orientation;Memory;Problem solving                 Orientation Level: Disoriented to;Place;Time   Memory: Decreased short-term memory       Problem Solving: Decreased initiation;Requires verbal cues General Comments: per daughter, at her baseline   General Comments  VSS on RA    Exercises     Shoulder Instructions      Home Living Family/patient expects to be discharged to:: Assisted living  Home Equipment: Walker - 4 wheels;Shower seat;Grab bars - tub/shower;Grab bars - toilet   Additional Comments[place] [facility] [place]      Prior Functioning/Environment Level of Independence: Needs assistance  Gait / Transfers Assistance Needed: walks with a rollator with her 02 in her basket ADL's / Homemaking Assistance Needed: can take herself to the bathroom and dining room, assisted for bathing and dressing and all homemaking            OT Problem List: Decreased strength;Impaired balance (sitting and/or standing);Decreased safety awareness;Decreased knowledge of use  of DME or AE;Cardiopulmonary status limiting activity;Decreased cognition      OT Treatment/Interventions: Self-care/ADL training;DME and/or AE instruction    OT Goals(Current goals can be found in the care plan section) Acute Rehab OT Goals Patient Stated Goal: Return home OT Goal Formulation: With patient Time For Goal Achievement: 02/06/17 Potential to Achieve Goals: Fair ADL Goals Pt Will Perform Grooming: with supervision;standing Pt Will Transfer to Toilet: with supervision;ambulating  OT Frequency: Min 2X/week   Barriers to D/C:            Co-evaluation              AM-PAC PT "6 Clicks" Daily Activity     Outcome Measure Help from another person eating meals?: None Help from another person taking care of personal grooming?: A Little Help from another person toileting, which includes using toliet, bedpan, or urinal?: A Lot Help from another person bathing (including washing, rinsing, drying)?: A Lot Help from another person to put on and taking off regular upper body clothing?: A Lot Help from another person to put on and taking off regular lower body clothing?: A Lot 6 Click Score: 15   End of Session Equipment Utilized During Treatment: Gait belt;Rolling walker;Oxygen  Activity Tolerance: Patient limited by fatigue Patient left: in chair;with call bell/phone within reach;with family/visitor present  OT Visit Diagnosis: Other abnormalities of gait and mobility (R26.89);Unsteadiness on feet (R26.81)                Time: 1610-96041146-1210 OT Time Calculation (min): 24 min Charges:  OT General Charges $OT Visit: 1 Visit OT Evaluation $OT Eval Moderate Complexity: 1 Mod OT Treatments $Self Care/Home Management : 8-22 mins G-Codes:     01/30/2017 Martie RoundJulie Florean Hoobler, OTR/L Pager: [phone]  Iran PlanasMay[place], [place] BailiffJulie Lynn 01/30/2017, 1:48 PM

## 2017-01-31 LAB — BASIC METABOLIC PANEL
ANION GAP: 10 (ref 5–15)
BUN: 26 mg/dL — ABNORMAL HIGH (ref 6–20)
CHLORIDE: 102 mmol/L (ref 101–111)
CO2: 32 mmol/L (ref 22–32)
Calcium: 8.3 mg/dL — ABNORMAL LOW (ref 8.9–10.3)
Creatinine, Ser: 1.15 mg/dL — ABNORMAL HIGH (ref 0.44–1.00)
GFR calc non Af Amer: 39 mL/min — ABNORMAL LOW (ref >60)
GFR, EST AFRICAN AMERICAN: 45 mL/min — AB (ref >60)
Glucose, Bld: 120 mg/dL — ABNORMAL HIGH (ref 65–99)
Potassium: 3.6 mmol/L (ref 3.5–5.1)
Sodium: 144 mmol/L (ref 135–145)

## 2017-01-31 MED ORDER — FUROSEMIDE 40 MG PO TABS: ORAL_TABLET | ORAL | 0 refills | Status: DC

## 2017-01-31 MED ORDER — FUROSEMIDE 40 MG PO TABS
40.0000 mg | ORAL_TABLET | Freq: Every day | ORAL | Status: DC
  Administered 2017-01-31: 40 mg via ORAL
  Filled 2017-01-31: qty 1

## 2017-01-31 MED ORDER — PREGABALIN 150 MG PO CAPS: 150.0000 mg | ORAL_CAPSULE | Freq: Two times a day (BID) | ORAL | 0 refills | Status: DC

## 2017-01-31 NOTE — Progress Notes (Signed)
Clinical Social Worker facilitated patient discharge including contacting patient family and facility to confirm patient discharge plans. CSW spoke to Hob[place]ette at facility and made her aware that patient will need hospice to follow at facility. Bronson IngYvette stated they will reach out to family and assist them with setting it up.   Clinical information faxed to facility and family agreeable with plan.Patients daughter stated she will [facility] [place] .  RN to call [phone] for report prior to discharge.  Clinical Social Worker will sign off for now as social work intervention is no longer needed. Please consult us again if new need arises.  Marrianne MoodAshley Jossiah Smoak, MSW, [employer] Inc[employer]CSWA [phone]

## 2017-01-31 NOTE — Progress Notes (Signed)
Daily Progress Note   Patient Name: Pamela Peterson       Date: 01/31/2017 DOB: [birth year]  Age: 83 y.o. MRN#: [id number] Attending Physician: Alveda Reasons, MD Primary Care Physician: Nicola Girt, DO Admit Date: 01/29/2017  Reason for Consultation/Follow-up: Establishing goals of care and Hospice Evaluation  Subjective: Patient resting in bed, states she feels well, better than yesterday. Tells me she ate breakfast - unsure if true d/t dementia. Not oriented to time and easily confused but understands she is in the hospital for fluid overload. Tells me she is "ready to get back to all my friends".  Length of Stay: 2  Current Medications: Scheduled Meds:  . aspirin EC  81 mg Oral Daily  . busPIRone  5 mg Oral TID  . calcium-vitamin D  0.5 tablet Oral Daily  . enoxaparin (LOVENOX) injection  30 mg Subcutaneous Q24H  . furosemide  40 mg Oral Daily  . ipratropium-albuterol  3 mL Nebulization TID  . levothyroxine  50 mcg Oral QHS  . metoprolol tartrate  50 mg Oral Daily  . mirtazapine  7.5 mg Oral QHS  . pantoprazole  40 mg Oral Daily  . QUEtiapine  25 mg Oral QHS    Continuous Infusions:   PRN Meds: acetaminophen  Physical Exam  Constitutional: No distress.  HENT:  Head: Normocephalic and atraumatic.  Cardiovascular: Normal rate and regular rhythm.  Pulmonary/Chest: Effort normal and breath sounds normal. No respiratory distress.  Abdominal: Soft. Bowel sounds are normal. There is no tenderness.  Musculoskeletal:       Right lower leg: She exhibits no deformity.       Left lower leg: She exhibits no edema.  Neurological: She is alert.  Disoriented to time and place. Oriented to self and situation.   Skin: Skin is warm and dry. Capillary refill takes less than 2 seconds. She is not  diaphoretic.  Psychiatric: She has a normal mood and affect. Her speech is normal and behavior is normal. Cognition and memory are impaired.            Vital Signs: BP 139/64 (BP Location: Left Arm)   Pulse 62   Temp 97.6 F (36.4 C) (Oral)   Resp 20   Ht _0  (1.295 m)   Wt 34.9 kg (77 lb)   SpO2 95%   BMI 20.81 kg/m  SpO2: SpO2: 95 % O2 Device: O2 Device: Nasal Cannula O2 Flow Rate: O2 Flow Rate (L/min): 3 L/min  Intake/output summary:   Intake/Output Summary (Last 24 hours) at 01/31/2017 0933 Last data filed at 01/31/2017 0600 Gross per 24 hour  Intake 60 ml  Output 600 ml  Net -540 ml   LBM: Last BM Date: 01/30/17 Baseline Weight: Weight: 40.8 kg (90 lb) Most recent weight: Weight: 34.9 kg (77 lb)       Palliative Assessment/Data: 50%    Flowsheet Rows     Most Recent Value  Intake Tab  Referral Department  Hospitalist  Unit at Time of Referral  Cardiac/Telemetry Unit  Palliative Care Primary Diagnosis  Cardiac  Date Notified  01/30/17  Palliative Care Type  New Palliative care  Reason for referral  Clarify Goals of Care  Date of Admission  01/29/17  Date first seen by Palliative Care  01/30/17  # of days Palliative referral response time  0 Day(s)  # of days IP prior to Palliative referral  1  Clinical Assessment  Palliative Performance Scale Score  50%  Psychosocial & Spiritual Assessment  Palliative Care Outcomes  Patient/Family meeting held?  No      Patient Active Problem List   Diagnosis Date Noted  . AKI (acute kidney injury) ([place])   . COPD exacerbation ([place])   . Goals of care, counseling/discussion   . Palliative care by specialist   . CHF exacerbation ([place]) 01/29/2017  . Acute respiratory failure with hypoxia ([place])   . Hyponatremia 09/22/2015  . Dysphagia   . Altered mental status 03/01/2015  . Restless legs 03/01/2015  . Failure to thrive in adult 01/23/2015  . Hypothyroidism 01/23/2015  . Essential hypertension 01/23/2015  . GERD  (gastroesophageal reflux disease) 01/23/2015    Palliative Care Assessment & Plan   HPI: 83 y.o. female  with past medical history of dementia, CHF w/ EF 20-25% in Oct, multiple falls, HTN, HLD, anxiety, esophageal dysmotility, recurrent pna, and oxygen dependent admitted on 01/29/2017 with fall and increasing lethargy. CXR revealed bilateral pleural effusions. BNP 4000. Patient initially tachypneic and short of breath. Signs of fluid overload on physical assessment. Treated for CHF exacerbation with lasix.  PMT consulted to discuss hospice options with daughter.    Assessment: Met w/ daughter, Juliann Pulse, and patient at bedside. Patient unable to participate in goals of care conversation. Introduced palliative care to daughter. Daughter brought up interest in having Hospice follow patient at [facility]. Discussed extra layer of support Hospice can provide. Discussed patients frequent admission for heart failure and overall decline in function and cognitive status. Daughter has good understanding of disease trajectory.  Recommendations/Plan:  Hospice at [facility] when ready for discharge  DNR  Goals of Care and Additional Recommendations:  Limitations on Scope of Treatment: DNR  Code Status:  DNR  Prognosis:   < 6 months  Discharge Planning:  ALF w/ Hospice  Care plan was discussed with social work, daughter  Thank you for allowing the Palliative Medicine Team to assist in the care of this patient.   Total Time 25 minutes Prolonged Time Billed  no       Greater than 50%  of this time was spent counseling and coordinating care related to the above assessment and plan.  Juel Burrow, DNP, AGNP-C Palliative Medicine Team Team Phone # [phone]

## 2017-01-31 NOTE — Clinical Social Work Note (Signed)
Correction: [facility] is [facility] [place] ([address], [place] [address]  Charlynn CourtSarah Karrington Studnicka, CSW [phone]

## 2017-01-31 NOTE — Progress Notes (Signed)
Family Medicine Teaching Service Daily Progress Note Intern Pager: [phone]  Patient name: Pamela Peterson Medical record number: [id number] Date of birth: 12/18/1919 Age: 83 y.o. Gender: female  Primary Care Provider: Leola BrazilEverly, Rebecca B, DO Consultants: palliative Code Status: DNR  Pt Overview and Major Events to Date:  Admitted to FPTS on 01/29/17  Assessment and Plan: Pamela Haynesis a 83 y.o.femalepresenting with fall and SOB. PMH is significant forHFrEF, dementia, depression/anxiety, hearing loss  Dyspnea Likely 2/2 CHF exacerbation. Troponins elevated but with flat trend at 0.05>0.06>0.06>0.04, so less likely cardiac in origin. EKG today showing diffuse t-wave inversion, unchanged since EKG on 12/4. Per daughter, POA, patient is frequently [facility] for CHF exacerbations, per patient's daughter last echo in October EF 20-25%. Patient's BNP elevated to 3918.2, no previous readings to compare.Pleural effusionsseenonCXR.No signs of infiltrate on CXR, no leukocytosis, and no fever making pneumonia unlikely. Increased O2 requirement, on 2L baseline but currently satting 94% on 3L O2. Patient does not complain of SOB but patient's history of dementia may be interfering with accurate subjective information about health. Patient refusing to be seen today or answer any further questions other than that she is breathing well.  -vitals per floor w/ pulse ox -transition to PO lasix 40mg  daily. Can be discharged with prn lasix for SOB -supplemental O2 as needed to keep sats >92% -monitor for signs of infection -duoneb treatment  -strict I's and O's  Fall Per EMS patient fell on day of admission, no head trama noted on admission, no concerning neurologic findings -PT/OT -up with assistance -fall precautions  Hypokalemia K of 3.6 today. K on admission of 3.9.  -replace with Kdur as needed -monitor with daily BMP  AKI SCr elevated to 1.15 today. Elevated to 1.01 on  admission. Baseline appears to be ~0.6. -continue to monitor -need to be careful with fluid hydration given history of CHF  HTN Home medication: 5 mg lisinopril. BP today currently at goal at 139/64. .  -holding ACEIs while AKI  Hypothyroidism Home medication: 50mcg synthroid daily -continue synthroid  Depression/anxiety Home medications: seroquil, buspar, and remeron  -continue home medications  Severe protein-calorie malnutrition Patient cachectic appearing -nutrition consulted, appreciate recommendations   Disposition Discussed care of patient with her daughter. Explained that patient is likely nearing end stage of disease process as she is re-admitted once a month. Patient's daughter explained that she noticed a decline in her dementia recently as well. Palliative care with plan to meet with daughter to discuss possible hospice admission on 12/5.  -hospice consult ordered. Discussed with patient's daughter prior to putting in referral   FEN/GI:heart healthy  Prophylaxis:lovenox  Disposition: [facility]   Subjective:  Patient refusing to be seen today. States she feels fine and is not SOB but will not answer any more questions. States that "you can listen to other patients but you cannot listen to me". Per nursing report, patient has been more "grumpy" today and secretary noted that patient keeps calling to nursing station stating she wants to go home today and wondering when she will go home  Objective: Temp:  [97.6 F (36.4 C)-98.7 F (37.1 C)] 97.9 F (36.6 C) (12/05 1146) Pulse Rate:  [60-78] 73 (12/05 1146) Resp:  [16-20] 18 (12/05 1146) BP: (121-158)/(45-69) 146/69 (12/05 1146) SpO2:  [90 %-96 %] 96 % (12/05 1146) Weight:  [77 lb (34.9 kg)] 77 lb (34.9 kg) (12/05 0546) Physical Exam: General: awake and alert, sitting up in bed eating breakfast and watching television, appears  comfortable  Cardiovascular: refused exam Respiratory: refused exam, did not  appear to have increased work of breathing  Abdomen: refused exam  Extremities: refused exam  Laboratory: Recent Labs  Lab 01/29/17 1306 01/30/17 0501  WBC 6.5 4.9  HGB 11.4* 9.6*  HCT 35.2* 30.2*  PLT 147* 127*   Recent Labs  Lab 01/29/17 1306 01/30/17 0501 01/31/17 0447  NA 143 142 144  K 3.9 3.0* 3.6  CL 107 100* 102  CO2 26 28 32  BUN 20 20 26*  CREATININE 1.01* 1.13* 1.15*  CALCIUM 9.1 8.3* 8.3*  PROT 6.7  --   --   BILITOT 1.1  --   --   ALKPHOS 62  --   --   ALT 14  --   --   AST 39  --   --   GLUCOSE 135* 98 120*     Ref. Range 01/29/2017 16:55 01/29/2017 23:44 01/30/2017 05:01 01/30/2017 10:16  Troponin I Latest Ref Range: <0.03 ng/mL 0.05 (HH) 0.06 (HH) 0.06 (HH) 0.04 (HH)    Imaging/Diagnostic Tests: Dg Chest Portable 1 View  Result Date: 01/29/2017 CLINICAL DATA:  83 year old female status post fall while walking. No syncope reported. Dyspnea. EXAM: PORTABLE CHEST 1 VIEW COMPARISON:  03/01/2015 radiographs of the chest FINDINGS: The left heart border is obscured by confluent pulmonary opacity likely to represent a moderate to large left pleural effusion with compressive atelectasis. Trace right pleural effusion. Pulmonary vascular congestion is noted. The thoracic aorta is calcified and ectatic. Deviation of the trachea convex to the right due to tortuous thoracic aorta. No apparent fracture or pneumothorax. Small loose bodies are seen in the left axillary pouch. IMPRESSION: 1. Bilateral pleural effusions, moderate to large on the left and trace on the right with pulmonary vascular congestion. 2. Aortic atherosclerosis. 3. No acute osseous appearing abnormality. Electronically Signed   By: Tollie Ethavid  Kwon M.D.   On: 01/29/2017 15:11    Oralia ManisAbraham, Deonne Rooks, DO 01/31/2017, 12:01 PM PGY-1, [facility] Medicine FPTS Intern pager: [phone], text pages welcome

## 2017-01-31 NOTE — Progress Notes (Signed)
Paged MD for primary RN regarding family requesting to have pt discharged via [facility] Social work aware  Awaiting discharge orders  MD returned page stated it is up to social work because MD unaware if pt facility accepts hospice Called social work back, Morrie Sheldonshley stated it is up to MD for discharge. She also stated she spoke to facility, they will accept pt and facility is to reach out to family.  Called back MD to inform  MD aware

## 2017-01-31 NOTE — Discharge Instructions (Signed)
You were admitted for difficulty breathing. While in the hospital you were given lasix to help remove some fluid. You improved in your breathing with lasix and will be discharged with the oral form of this medicine.   Please follow up with your PCP within 1 week of discharge.  If you have continued shortness of breath, chest pain, or fever please come to the Emergency room.   We have discharged you with oral lasix. Please take 40 mg every other day, and alternate that with 20 mg (40 mg on Monday, Wednesday, and Friday. 20 mg on Tuesday, Thursday, Saturday, and Sunday).  If you gain more that 2 lbs in 2 days take an extra dose of lasix.

## 2017-01-31 NOTE — Progress Notes (Signed)
Pt has orders to be discharged. Discharge instructions given and pt has no additional questions at this time. Medication regimen reviewed and pt educated. Pt verbalized understanding and has no additional questions. Telemetry box removed. IV removed and site in good condition. Pt stable and waiting for transportation.  Jadyn Brasher RN 

## 2017-01-31 NOTE — Discharge Summary (Signed)
[facility]  Patient name: Pamela Peterson Medical record number: [id number] Date of birth: 09/30/1919 Age: 83 y.o. Gender: female Date of Admission: 01/29/2017  Date of Discharge: 01/31/17 Admitting Physician: Tobey GrimJeffrey H Walden, MD  Primary Care Provider: Leola BrazilEverly, Rebecca B, DO Consultants: palliative care    Indication for Hospitalization: Dyspnea  Discharge Diagnoses/Problem List:  Dyspnea Fall Hypokalemia AKI HTN Hypothyroidism Depression/anxiety Severe protein-calorie malnutrition   Disposition: SNF w/ hospice   Discharge Condition: stable  Discharge Exam:  Patient refusing exam   Brief Hospital Course: **Previous discharge summary written and signed prior to change in lyrica dose**  Pamela Peterson is a 83 y.o. female presenting with SOB. Patient has history of dementia and therefor history and subjective opinion of complaints may not be accurate. Patient is a [facility]. Per patient's daughter patient has frequent, almost monthly, admission to [facility] for fluid overload and pneumonia. CXR on admission patient had bilateral pleural effusions, but no acute changes. While admitted patient was placed on increased supplemental O2, home O2 2L and was put on 6L O2 on admission but transitioned back to home O2 prior to discharge. Patient received IV lasix, 60mg  daily, and improved. Patient was transitioned to PO lasix prior to discharge. Patient's mental status waxed and waned during admission. At times patient very pleasant, and at other times refusing care.   Issues for Follow Up:  1. Discharged on prn lasix for SOB 2. Monitor lung sounds, patient refusing exams  3. Discharged with oral lasix. Alternate between 20 and 40mg .  4. If gaining more than 2lbs every 2 days take an extra dose of 40mg  lasix.  5. Lyrica dose decreased due to poor renal function  Significant Procedures: none  Significant Labs and  Imaging:  Recent Labs  Lab 01/29/17 1306 01/30/17 0501  WBC 6.5 4.9  HGB 11.4* 9.6*  HCT 35.2* 30.2*  PLT 147* 127*   Recent Labs  Lab 01/29/17 1306 01/30/17 0501 01/31/17 0447  NA 143 142 144  K 3.9 3.0* 3.6  CL 107 100* 102  CO2 26 28 32  GLUCOSE 135* 98 120*  BUN 20 20 26*  CREATININE 1.01* 1.13* 1.15*  CALCIUM 9.1 8.3* 8.3*  ALKPHOS 62  --   --   AST 39  --   --   ALT 14  --   --   ALBUMIN 3.8  --   --      Ref. Range 01/29/2017 16:55 01/29/2017 23:44 01/30/2017 05:01 01/30/2017 10:16  Troponin I Latest Ref Range: <0.03 ng/mL 0.05 (HH) 0.06 (HH) 0.06 (HH) 0.04 (HH)     Dg Chest Portable 1 View  Result Date: 01/29/2017 CLINICAL DATA:  83 year old female status post fall while walking. No syncope reported. Dyspnea. EXAM: PORTABLE CHEST 1 VIEW COMPARISON:  03/01/2015 radiographs of the chest FINDINGS: The left heart border is obscured by confluent pulmonary opacity likely to represent a moderate to large left pleural effusion with compressive atelectasis. Trace right pleural effusion. Pulmonary vascular congestion is noted. The thoracic aorta is calcified and ectatic. Deviation of the trachea convex to the right due to tortuous thoracic aorta. No apparent fracture or pneumothorax. Small loose bodies are seen in the left axillary pouch. IMPRESSION: 1. Bilateral pleural effusions, moderate to large on the left and trace on the right with pulmonary vascular congestion. 2. Aortic atherosclerosis. 3. No acute osseous appearing abnormality. Electronically Signed   By: Tollie Ethavid  Kwon M.D.   On: 01/29/2017  15:11     Results/Tests Pending at Time of Discharge:  Unresulted Labs (From admission, onward)   Start     Ordered   01/31/17 0500  Basic metabolic panel  Daily,   R     01/30/17 1154       Discharge Medications:  Allergies as of 01/31/2017      Reactions   Other Shortness Of Breath, Other (See Comments)   CONFUSION AND PHYSICALLY SICK: Detergents, formaldehyde, any strong  smells   Penicillins Hives, Swelling   Has patient had a PCN reaction causing immediate rash, facial/tongue/throat swelling, SOB or lightheadedness with hypotension: Yes Has patient had a PCN reaction causing severe rash involving mucus membranes or skin necrosis: NO Has patient had a PCN reaction that required hospitalization already in hospital  Has patient had a PCN reaction occurring within the last 10 years: no If all of the above answers are "NO", then may proceed with Cephalosporin use.   Sulfa Antibiotics Hives, Swelling   Lorazepam Other (See Comments)   Restless legs      Medication List    TAKE these medications   acetaminophen 325 MG tablet Commonly known as:  TYLENOL Take 325-650 mg by mouth every 8 (eight) hours as needed for mild pain or moderate pain.   aspirin EC 81 MG tablet Take 81 mg by mouth daily.   busPIRone 5 MG tablet Commonly known as:  BUSPAR Take 5 mg by mouth 3 (three) times daily.   calcium-vitamin D 250-125 MG-UNIT tablet Commonly known as:  OSCAL WITH D Take 1 tablet by mouth daily.   esomeprazole 40 MG capsule Commonly known as:  NEXIUM Take 40 mg by mouth daily.   furosemide 40 MG tablet Commonly known as:  LASIX Alternate between 40 and 20 mg daily (40mg  every M/W/F, 20 mg on T/Th/Sat/Sun)   levothyroxine 50 MCG tablet Commonly known as:  SYNTHROID, LEVOTHROID Take 50 mcg by mouth at bedtime.   lisinopril 5 MG tablet Commonly known as:  PRINIVIL,ZESTRIL Take 5 mg by mouth daily.   magnesium oxide 400 MG tablet Commonly known as:  MAG-OX Take 400 mg by mouth daily.   metoprolol tartrate 25 MG tablet Commonly known as:  LOPRESSOR Take 50 mg by mouth daily.   mirtazapine 7.5 MG tablet Commonly known as:  REMERON Take 7.5 mg by mouth at bedtime.   MULTIVITAMIN & MINERAL PO Take 1 tablet by mouth daily.   ondansetron 8 MG disintegrating tablet Commonly known as:  ZOFRAN ODT 8mg  ODT q8 hours prn nausea   Potassium Hydroxide  10 % Soln Take 7.5 mLs by mouth daily.   pregabalin 150 MG capsule Commonly known as:  LYRICA Take 1 capsule (150 mg total) by mouth 2 (two) times daily. What changed:    medication strength  how much to take  when to take this  Another medication with the same name was removed. Continue taking this medication, and follow the directions you see here.   QUEtiapine 25 MG tablet Commonly known as:  SEROQUEL Take 25 mg by mouth at bedtime.       Discharge Instructions: Please refer to Patient Instructions section of EMR for full details.  Patient was counseled important signs and symptoms that should prompt return to medical care, changes in medications, dietary instructions, activity restrictions, and follow up appointments.   Follow-Up Appointments: Follow-up Information    Leola Brazilverly, Rebecca B, DO. Schedule an appointment as soon as possible for a visit in 2 day(s).  Specialty:  Internal Medicine Why:  Please call and schedule a follow up with your PCP within 1 week of discharge  Contact information: [address] [place] [address] [phone]           Oralia Manis, DO 01/31/2017, 4:24 PM PGY-1, [facility]

## 2017-01-31 NOTE — Progress Notes (Signed)
Pt refusing for RN to perform assessment at this time. Will retry.

## 2017-01-31 NOTE — NC FL2 (Signed)
MEDICAID FL2 LEVEL OF CARE SCREENING TOOL     IDENTIFICATION  Patient Name: Pamela Peterson Birthdate: 04/07/1919 Sex: female Admission Date (Current Location): 01/29/2017  [facility] [place]   Facility and Address:  [facility], [address], [place], [place] [address]      Provider Number: [id number]  Attending Physician Name and Address:  Tobey GrimWalden, Jeffrey H, MD  Relative Name and Phone Number:  Nestor RampKathy henry, [phone]    Current Level of Care: Hospital Recommended Level of Care: Assisted Living Facility Prior Approval Number:    Date Approved/Denied:   PASRR Number:    Discharge Plan: Other (Comment)(back to ALF)    Current Diagnoses: Patient Active Problem List   Diagnosis Date Noted  . AKI (acute kidney injury) (HCC)   . COPD exacerbation (HCC)   . Goals of care, counseling/discussion   . Palliative care by specialist   . CHF exacerbation (HCC) 01/29/2017  . Acute respiratory failure with hypoxia (HCC)   . Hyponatremia 09/22/2015  . Dysphagia   . Altered mental status 03/01/2015  . Restless legs 03/01/2015  . Failure to thrive in adult 01/23/2015  . Hypothyroidism 01/23/2015  . Essential hypertension 01/23/2015  . GERD (gastroesophageal reflux disease) 01/23/2015    Orientation RESPIRATION BLADDER Height & Weight     Self, Situation, Place  O2(3L) Continent Weight: 77 lb (34.9 kg) Height:  4\' 3"  (129.5 cm)  BEHAVIORAL SYMPTOMS/MOOD NEUROLOGICAL BOWEL NUTRITION STATUS      Continent Diet(heart room)  AMBULATORY STATUS COMMUNICATION OF NEEDS Skin   Limited Assist Verbally Normal                       Personal Care Assistance Level of Assistance  Bathing, Feeding, Dressing Bathing Assistance: Limited assistance Feeding assistance: Independent Dressing Assistance: Limited assistance     Functional Limitations Info  Sight, Hearing, Speech Sight Info: Adequate Hearing Info: Adequate Speech  Info: Adequate    SPECIAL CARE FACTORS FREQUENCY  PT (By licensed PT), OT (By licensed OT)     PT Frequency: 2x wk OT Frequency: 2x wk            Contractures Contractures Info: Not present    Additional Factors Info  Allergies, Code Status Code Status Info: DNR Allergies Info: OTHER, PENICILLINS, SULFA ANTIBIOTICS, LORAZEPAM           Current Medications (01/31/2017):  This is the current hospital active medication list Current Facility-Administered Medications  Medication Dose Route Frequency Provider Last Rate Last Dose  . acetaminophen (TYLENOL) tablet 325-650 mg  325-650 mg Oral Q8H PRN Arvilla MarketWallace, Catherine Lauren, DO      . aspirin EC tablet 81 mg  81 mg Oral Daily Arvilla MarketWallace, Catherine Lauren, DO   81 mg at 01/31/17 1103  . busPIRone (BUSPAR) tablet 5 mg  5 mg Oral TID Arvilla MarketWallace, Catherine Lauren, DO   5 mg at 01/31/17 1103  . calcium-vitamin D (OSCAL WITH D) 500-200 MG-UNIT per tablet 0.5 tablet  0.5 tablet Oral Daily Arvilla MarketWallace, Catherine Lauren, DO   0.5 tablet at 01/31/17 1103  . enoxaparin (LOVENOX) injection 30 mg  30 mg Subcutaneous Q24H Arvilla MarketWallace, Catherine Lauren, DO   30 mg at 01/30/17 [id number]  . furosemide (LASIX) tablet 40 mg  40 mg Oral Daily Darin EngelsAbraham, Sherin, DO   40 mg at 01/31/17 1103  . ipratropium-albuterol (DUONEB) 0.5-2.5 (3) MG/3ML nebulizer solution 3 mL  3 mL Nebulization TID Payton MccallumWalden, Jeffrey  H, MD   3 mL at 01/31/17 0914  . levothyroxine (SYNTHROID, LEVOTHROID) tablet 50 mcg  50 mcg Oral QHS Arvilla MarketWallace, Catherine Lauren, DO   50 mcg at 01/30/17 2134  . metoprolol tartrate (LOPRESSOR) tablet 50 mg  50 mg Oral Daily Arvilla MarketWallace, Catherine Lauren, DO   50 mg at 01/31/17 1103  . mirtazapine (REMERON) tablet 7.5 mg  7.5 mg Oral QHS Arvilla MarketWallace, Catherine Lauren, DO   7.5 mg at 01/30/17 2134  . pantoprazole (PROTONIX) EC tablet 40 mg  40 mg Oral Daily Arvilla MarketWallace, Catherine Lauren, DO   40 mg at 01/31/17 1103  . QUEtiapine (SEROQUEL) tablet 25 mg  25 mg Oral QHS Arvilla MarketWallace, Catherine  Lauren, DO   25 mg at 01/30/17 2134     Discharge Medications: Please see discharge summary for a list of discharge medications.  Relevant Imaging Results:  Relevant Lab Results:   Additional Information (pt needs hospise to follow at facility)  Althea CharonAshley C Abreanna Drawdy, LCSW

## 2017-03-01 ENCOUNTER — Other Ambulatory Visit: Payer: Self-pay

## 2017-03-01 ENCOUNTER — Inpatient Hospital Stay (HOSPITAL_COMMUNITY)
Admission: EM | Admit: 2017-03-01 | Discharge: 2017-03-05 | DRG: 292 | Disposition: A | Discharge status: Hospice | Attending: Internal Medicine | Admitting: Internal Medicine

## 2017-03-01 DIAGNOSIS — I5023 Acute on chronic systolic (congestive) heart failure: Secondary | ICD-10-CM | POA: Diagnosis present

## 2017-03-01 DIAGNOSIS — E785 Hyperlipidemia, unspecified: Secondary | ICD-10-CM | POA: Diagnosis present

## 2017-03-01 DIAGNOSIS — F418 Other specified anxiety disorders: Secondary | ICD-10-CM | POA: Diagnosis present

## 2017-03-01 DIAGNOSIS — F32A Depression, unspecified: Secondary | ICD-10-CM | POA: Diagnosis present

## 2017-03-01 DIAGNOSIS — I110 Hypertensive heart disease with heart failure: Principal | ICD-10-CM | POA: Diagnosis present

## 2017-03-01 DIAGNOSIS — Z882 Allergy status to sulfonamides status: Secondary | ICD-10-CM

## 2017-03-01 DIAGNOSIS — E039 Hypothyroidism, unspecified: Secondary | ICD-10-CM | POA: Diagnosis present

## 2017-03-01 DIAGNOSIS — F329 Major depressive disorder, single episode, unspecified: Secondary | ICD-10-CM | POA: Diagnosis present

## 2017-03-01 DIAGNOSIS — R0600 Dyspnea, unspecified: Secondary | ICD-10-CM

## 2017-03-01 DIAGNOSIS — Z7982 Long term (current) use of aspirin: Secondary | ICD-10-CM

## 2017-03-01 DIAGNOSIS — Z515 Encounter for palliative care: Secondary | ICD-10-CM | POA: Diagnosis present

## 2017-03-01 DIAGNOSIS — E876 Hypokalemia: Secondary | ICD-10-CM | POA: Diagnosis present

## 2017-03-01 DIAGNOSIS — F0390 Unspecified dementia without behavioral disturbance: Secondary | ICD-10-CM | POA: Diagnosis present

## 2017-03-01 DIAGNOSIS — Z880 Allergy status to penicillin: Secondary | ICD-10-CM

## 2017-03-01 DIAGNOSIS — D649 Anemia, unspecified: Secondary | ICD-10-CM

## 2017-03-01 DIAGNOSIS — Z8249 Family history of ischemic heart disease and other diseases of the circulatory system: Secondary | ICD-10-CM

## 2017-03-01 DIAGNOSIS — R0603 Acute respiratory distress: Secondary | ICD-10-CM | POA: Diagnosis present

## 2017-03-01 DIAGNOSIS — M810 Age-related osteoporosis without current pathological fracture: Secondary | ICD-10-CM | POA: Diagnosis present

## 2017-03-01 DIAGNOSIS — J90 Pleural effusion, not elsewhere classified: Secondary | ICD-10-CM

## 2017-03-01 DIAGNOSIS — I509 Heart failure, unspecified: Secondary | ICD-10-CM

## 2017-03-01 DIAGNOSIS — J449 Chronic obstructive pulmonary disease, unspecified: Secondary | ICD-10-CM | POA: Diagnosis present

## 2017-03-01 DIAGNOSIS — R64 Cachexia: Secondary | ICD-10-CM | POA: Diagnosis present

## 2017-03-01 DIAGNOSIS — Z681 Body mass index (BMI) 19 or less, adult: Secondary | ICD-10-CM

## 2017-03-01 DIAGNOSIS — Z825 Family history of asthma and other chronic lower respiratory diseases: Secondary | ICD-10-CM

## 2017-03-01 DIAGNOSIS — Z66 Do not resuscitate: Secondary | ICD-10-CM | POA: Diagnosis present

## 2017-03-01 DIAGNOSIS — I10 Essential (primary) hypertension: Secondary | ICD-10-CM | POA: Diagnosis present

## 2017-03-01 NOTE — ED Triage Notes (Signed)
Patient brought in by EMS for worsening shortness of breath. Patient is brought in [facility].

## 2017-03-01 NOTE — ED Notes (Signed)
Bed: [id number] Expected date:  Expected time:  Means of arrival:  Comments: EMS hospice patient

## 2017-03-01 NOTE — ED Provider Notes (Signed)
[facility]-EMERGENCY DEPT Provider Note   CSN: [id number] Arrival date & time: 03/01/17  2328     History   Chief Complaint Shortness of breath  HPI Pamela Peterson is a 84 y.o. female.  The history is provided by the nursing home and a relative. The history is limited by the condition of the patient (Dementia).  She has history of CHF, COPD, hypertension, hyperlipidemia and comes in with sudden onset of dyspnea this evening.  Daughter is here and states that this morning she was breathing fine and her feet were not swollen.  She is supposed to be weighed daily with instructions to increase her furosemide dose if weight goes up by more than 2 pounds on 2 consecutive days.  Daughter states that she has been hospitalized almost monthly because of problems with heart failure.  Last hospitalization, she was placed on hospice care.  Unfortunately, hospice was not able to be reached tonight, so she was transferred here.  Of note, she is on constant oxygen at the nursing home at 2 L/min nasal cannula.  Past Medical History:  Diagnosis Date  . Anxiety   . CHF (congestive heart failure) (HCC)   . Hyperlipidemia   . Hypertension   . Hypothyroid   . Osteoporosis     Patient Active Problem List   Diagnosis Date Noted  . AKI (acute kidney injury) (HCC)   . COPD exacerbation (HCC)   . Goals of care, counseling/discussion   . Palliative care by specialist   . CHF exacerbation (HCC) 01/29/2017  . Acute respiratory failure with hypoxia (HCC)   . Hyponatremia 09/22/2015  . Dysphagia   . Altered mental status 03/01/2015  . Restless legs 03/01/2015  . Failure to thrive in adult 01/23/2015  . Hypothyroidism 01/23/2015  . Essential hypertension 01/23/2015  . GERD (gastroesophageal reflux disease) 01/23/2015    Past Surgical History:  Procedure Laterality Date  . BLADDER SURGERY      OB History    No data available       Home Medications    Prior to Admission  medications   Medication Sig Start Date End Date Taking? Authorizing Provider  acetaminophen (TYLENOL) 325 MG tablet Take 325-650 mg by mouth every 8 (eight) hours as needed for mild pain or moderate pain.    [provider]  aspirin EC 81 MG tablet Take 81 mg by mouth daily.    [provider]  busPIRone (BUSPAR) 5 MG tablet Take 5 mg by mouth 3 (three) times daily.    [provider]  calcium-vitamin D (OSCAL WITH D) 250-125 MG-UNIT tablet Take 1 tablet by mouth daily.    [provider]  esomeprazole (NEXIUM) 40 MG capsule Take 40 mg by mouth daily.     [provider]  furosemide (LASIX) 40 MG tablet Alternate between 40 and 20 mg daily (40mg  every M/W/F, 20 mg on T/Th/Sat/Sun) 01/31/17   Oralia Manis, DO  levothyroxine (SYNTHROID, LEVOTHROID) 50 MCG tablet Take 50 mcg by mouth at bedtime.     [provider]  lisinopril (PRINIVIL,ZESTRIL) 5 MG tablet Take 5 mg by mouth daily.    [provider]  magnesium oxide (MAG-OX) 400 MG tablet Take 400 mg by mouth daily.    [provider]  metoprolol tartrate (LOPRESSOR) 25 MG tablet Take 50 mg by mouth daily.    [provider]  mirtazapine (REMERON) 7.5 MG tablet Take 7.5 mg by mouth at bedtime.    [provider]  Multiple Vitamins-Minerals (MULTIVITAMIN & MINERAL PO) Take 1 tablet by mouth daily.    [provider]  ondansetron (ZOFRAN ODT) 8 MG disintegrating tablet 8mg  ODT q8 hours prn nausea Patient not taking: Reported on 01/29/2017 11/12/15   Palumbo, April, MD  Potassium Hydroxide 10 % SOLN Take 7.5 mLs by mouth daily.    [provider]  pregabalin (LYRICA) 150 MG capsule Take 1 capsule (150 mg total) by mouth 2 (two) times daily. 01/31/17 01/31/18  Oralia Manis, DO  QUEtiapine (SEROQUEL) 25 MG tablet Take 25 mg by mouth at bedtime.    [provider]    Family History Family History  Problem Relation Age of Onset  .  Heart attack Brother   . COPD Sister   . Cancer Brother   . Tuberculosis Mother   . Heart attack Father   . Alcohol abuse Father     Social History Social History   Tobacco Use  . Smoking status: Never Smoker  . Smokeless tobacco: Never Used  Substance Use Topics  . Alcohol use: No  . Drug use: No     Allergies   Other; Penicillins; Sulfa antibiotics; and Lorazepam   Review of Systems Review of Systems  Unable to perform ROS: Dementia     Physical Exam Updated Vital Signs BP (!) 135/121   Pulse 73   Temp 97.8 F (36.6 C) (Oral)   Resp 12   SpO2 100%   Physical Exam  Nursing note and vitals reviewed.  Frail appearing and somewhat cachectic 84 year old female, with somewhat labored breathing, but in no acute distress. Vital signs are significant for hypertension. Oxygen saturation is 100%, which is normal. Head is normocephalic and atraumatic. PERRLA, EOMI. Oropharynx is clear. Neck is nontender and supple without adenopathy. JVD present at 90 degrees. Back is nontender and there is no CVA tenderness. Lungs have bibasilar rales going about halfway up without wheezes or rhonchi. Chest is nontender. Heart has regular rate and rhythm without murmur. Abdomen is soft, flat, nontender without masses or hepatosplenomegaly and peristalsis is normoactive. Extremities have 2+ pedal edema, full range of motion is present. Skin is warm and dry without rash. Neurologic: She is awake and alert and cooperative, but nonverbal, cranial nerves are intact, there are no motor or sensory deficits.  ED Treatments / Results  Labs (all labs ordered are listed, but only abnormal results are displayed) Labs Reviewed  BASIC METABOLIC PANEL - Abnormal; Notable for the following components:      Result Value   Glucose, Bld 136 (*)    Calcium 8.6 (*)    GFR calc non Af Amer 58 (*)    All other components within normal limits  CBC WITH DIFFERENTIAL/PLATELET - Abnormal; Notable for the  following components:   RBC 3.66 (*)    Hemoglobin 10.9 (*)    HCT 33.5 (*)    RDW 15.8 (*)    Lymphs Abs 0.5 (*)    All other components within normal limits  BRAIN NATRIURETIC PEPTIDE - Abnormal; Notable for the following components:   B Natriuretic Peptide 3,614.1 (*)    All other components within normal limits  TROPONIN I    EKG Sinus rhythm 84 bpm with frequent PVCs.  Left ventricular hypertrophy with secondary repolarization abnormality.  Normal axis and intervals.  When compared with ECG from 01/31/2017, no significant changes are seen.  Radiology Dg Chest Port 1 View  Result Date: 03/02/2017 CLINICAL DATA:  84 year old  with shortness of breath. EXAM: PORTABLE CHEST 1 VIEW COMPARISON:  Radiographs 01/29/2017 FINDINGS: Persistent moderate large left pleural effusion, unchanged. Persistent trace right pleural effusion. Persistent but improved pulmonary edema from prior exam. Unchanged heart size and mediastinal contours, left heart border obscured by pleural fluid. No pneumothorax. Probable scarring in the right mid lung. No acute osseous abnormalities are seen. IMPRESSION: 1. Unchanged moderate to large left and trace right pleural effusion. Improved pulmonary edema from prior exam. 2. Probable right midlung scarring. Electronically Signed   By: Rubye OaksMelanie  Ehinger M.D.   On: 03/02/2017 00:33    Procedures Procedures (including critical care time)  Medications Ordered in ED Medications  furosemide (LASIX) injection 40 mg (40 mg Intravenous Given 03/02/17 0112)     Initial Impression / Assessment and Plan / ED Course  I have reviewed the triage vital signs and the nursing notes.  Pertinent labs & imaging results that were available during my care of the patient were reviewed by me and considered in my medical decision making (see chart for details).  CHF exacerbation.  Old records are reviewed, and she was admitted to the hospital 1 month ago for CHF exacerbation.  Screening labs  are obtained including BNP, she is given a dose of furosemide.  She has had a good diuresis with furosemide.  However, if oxygen is dropped to her baseline 2 L/min, oxygen desaturation occurs to the level of 86%.  She is placed back on 4 L/min nasal cannula oxygen.  Chest x-ray shows changes of CHF with large left pleural effusion.  Laboratory evaluation significant for markedly elevated BNP of 3614, and mild anemia which is stable.  Case is discussed with Dr. Antionette Charpyd of [facility], who agrees to admit the patient.  Final Clinical Impressions(s) / ED Diagnoses   Final diagnoses:  Acute on chronic congestive heart failure, unspecified heart failure type (HCC)  Pleural effusion, left  Normochromic normocytic anemia    ED Discharge Orders    None       Dione BoozeGlick, Allie Ousley, MD 03/02/17 [id number]

## 2017-03-02 ENCOUNTER — Other Ambulatory Visit: Payer: Self-pay

## 2017-03-02 ENCOUNTER — Encounter (HOSPITAL_COMMUNITY): Payer: Self-pay | Admitting: Family Medicine

## 2017-03-02 ENCOUNTER — Emergency Department (HOSPITAL_COMMUNITY)

## 2017-03-02 DIAGNOSIS — I509 Heart failure, unspecified: Secondary | ICD-10-CM

## 2017-03-02 DIAGNOSIS — I10 Essential (primary) hypertension: Secondary | ICD-10-CM | POA: Diagnosis not present

## 2017-03-02 DIAGNOSIS — D649 Anemia, unspecified: Secondary | ICD-10-CM | POA: Diagnosis not present

## 2017-03-02 DIAGNOSIS — F32A Depression, unspecified: Secondary | ICD-10-CM | POA: Diagnosis present

## 2017-03-02 DIAGNOSIS — F329 Major depressive disorder, single episode, unspecified: Secondary | ICD-10-CM | POA: Diagnosis present

## 2017-03-02 DIAGNOSIS — J90 Pleural effusion, not elsewhere classified: Secondary | ICD-10-CM | POA: Diagnosis present

## 2017-03-02 DIAGNOSIS — E038 Other specified hypothyroidism: Secondary | ICD-10-CM | POA: Diagnosis not present

## 2017-03-02 LAB — CBC WITH DIFFERENTIAL/PLATELET
BASOS ABS: 0 10*3/uL (ref 0.0–0.1)
Basophils Relative: 0 %
Eosinophils Absolute: 0 10*3/uL (ref 0.0–0.7)
Eosinophils Relative: 0 %
HEMATOCRIT: 33.5 % — AB (ref 36.0–46.0)
Hemoglobin: 10.9 g/dL — ABNORMAL LOW (ref 12.0–15.0)
LYMPHS ABS: 0.5 10*3/uL — AB (ref 0.7–4.0)
LYMPHS PCT: 9 %
MCH: 29.8 pg (ref 26.0–34.0)
MCHC: 32.5 g/dL (ref 30.0–36.0)
MCV: 91.5 fL (ref 78.0–100.0)
MONOS PCT: 5 %
Monocytes Absolute: 0.3 10*3/uL (ref 0.1–1.0)
NEUTROS ABS: 5.3 10*3/uL (ref 1.7–7.7)
Neutrophils Relative %: 86 %
Platelets: 171 10*3/uL (ref 150–400)
RBC: 3.66 MIL/uL — ABNORMAL LOW (ref 3.87–5.11)
RDW: 15.8 % — AB (ref 11.5–15.5)
WBC: 6.2 10*3/uL (ref 4.0–10.5)

## 2017-03-02 LAB — BASIC METABOLIC PANEL
ANION GAP: 7 (ref 5–15)
BUN: 20 mg/dL (ref 6–20)
CHLORIDE: 107 mmol/L (ref 101–111)
CO2: 26 mmol/L (ref 22–32)
Calcium: 8.6 mg/dL — ABNORMAL LOW (ref 8.9–10.3)
Creatinine, Ser: 0.83 mg/dL (ref 0.44–1.00)
GFR calc Af Amer: >60 mL/min (ref >60)
GFR calc non Af Amer: 58 mL/min — ABNORMAL LOW (ref >60)
Glucose, Bld: 136 mg/dL — ABNORMAL HIGH (ref 65–99)
POTASSIUM: 3.6 mmol/L (ref 3.5–5.1)
Sodium: 140 mmol/L (ref 135–145)

## 2017-03-02 LAB — BRAIN NATRIURETIC PEPTIDE: B Natriuretic Peptide: 3614.1 pg/mL — ABNORMAL HIGH (ref 0.0–100.0)

## 2017-03-02 LAB — TROPONIN I: Troponin I: <0.03 ng/mL (ref <0.03)

## 2017-03-02 MED ORDER — ORAL CARE MOUTH RINSE
15.0000 mL | Freq: Two times a day (BID) | OROMUCOSAL | Status: DC
  Administered 2017-03-02 – 2017-03-05 (×6): 15 mL via OROMUCOSAL

## 2017-03-02 MED ORDER — ENOXAPARIN SODIUM 30 MG/0.3ML ~~LOC~~ SOLN
30.0000 mg | SUBCUTANEOUS | Status: DC
  Administered 2017-03-02 – 2017-03-05 (×4): 30 mg via SUBCUTANEOUS
  Filled 2017-03-02 (×4): qty 0.3

## 2017-03-02 MED ORDER — ACETAMINOPHEN 325 MG PO TABS
650.0000 mg | ORAL_TABLET | ORAL | Status: DC | PRN
  Administered 2017-03-03: 650 mg via ORAL
  Filled 2017-03-02: qty 2

## 2017-03-02 MED ORDER — SODIUM CHLORIDE 0.9% FLUSH
3.0000 mL | INTRAVENOUS | Status: DC | PRN
  Administered 2017-03-02: 3 mL via INTRAVENOUS

## 2017-03-02 MED ORDER — METOPROLOL TARTRATE 50 MG PO TABS
50.0000 mg | ORAL_TABLET | Freq: Every day | ORAL | Status: DC
  Administered 2017-03-02 – 2017-03-05 (×4): 50 mg via ORAL
  Filled 2017-03-02: qty 2
  Filled 2017-03-02 (×3): qty 1

## 2017-03-02 MED ORDER — SODIUM CHLORIDE 0.9 % IV SOLN: 250.0000 mL | INTRAVENOUS | Status: DC | PRN

## 2017-03-02 MED ORDER — ONDANSETRON HCL 4 MG/2ML IJ SOLN: 4.0000 mg | Freq: Four times a day (QID) | INTRAMUSCULAR | Status: DC | PRN

## 2017-03-02 MED ORDER — FUROSEMIDE 10 MG/ML IJ SOLN
40.0000 mg | Freq: Two times a day (BID) | INTRAMUSCULAR | Status: DC
  Administered 2017-03-02 – 2017-03-05 (×7): 40 mg via INTRAVENOUS
  Filled 2017-03-02 (×7): qty 4

## 2017-03-02 MED ORDER — PANTOPRAZOLE SODIUM 40 MG PO TBEC
40.0000 mg | DELAYED_RELEASE_TABLET | Freq: Every day | ORAL | Status: DC
  Administered 2017-03-02 – 2017-03-05 (×4): 40 mg via ORAL
  Filled 2017-03-02 (×4): qty 1

## 2017-03-02 MED ORDER — PREGABALIN 50 MG PO CAPS: 150.0000 mg | ORAL_CAPSULE | Freq: Two times a day (BID) | ORAL | Status: DC

## 2017-03-02 MED ORDER — LEVOTHYROXINE SODIUM 50 MCG PO TABS
50.0000 ug | ORAL_TABLET | Freq: Every day | ORAL | Status: DC
  Administered 2017-03-02 – 2017-03-05 (×4): 50 ug via ORAL
  Filled 2017-03-02 (×4): qty 1

## 2017-03-02 MED ORDER — MAGNESIUM OXIDE 400 MG PO TABS: 400.0000 mg | ORAL_TABLET | Freq: Every day | ORAL | Status: DC

## 2017-03-02 MED ORDER — MIRTAZAPINE 15 MG PO TABS
7.5000 mg | ORAL_TABLET | Freq: Every day | ORAL | Status: DC
  Administered 2017-03-02 – 2017-03-04 (×3): 7.5 mg via ORAL
  Filled 2017-03-02 (×3): qty 1

## 2017-03-02 MED ORDER — BUSPIRONE HCL 5 MG PO TABS
5.0000 mg | ORAL_TABLET | Freq: Three times a day (TID) | ORAL | Status: DC
  Administered 2017-03-02 – 2017-03-05 (×10): 5 mg via ORAL
  Filled 2017-03-02 (×10): qty 1

## 2017-03-02 MED ORDER — ASPIRIN EC 81 MG PO TBEC
81.0000 mg | DELAYED_RELEASE_TABLET | Freq: Every day | ORAL | Status: DC
  Administered 2017-03-02 – 2017-03-05 (×4): 81 mg via ORAL
  Filled 2017-03-02 (×4): qty 1

## 2017-03-02 MED ORDER — SODIUM CHLORIDE 0.9% FLUSH
3.0000 mL | Freq: Two times a day (BID) | INTRAVENOUS | Status: DC
  Administered 2017-03-02 – 2017-03-05 (×7): 3 mL via INTRAVENOUS

## 2017-03-02 MED ORDER — CALCIUM CARBONATE-VITAMIN D 250-125 MG-UNIT PO TABS: 1.0000 | ORAL_TABLET | Freq: Every day | ORAL | Status: DC

## 2017-03-02 MED ORDER — PREGABALIN 75 MG PO CAPS
150.0000 mg | ORAL_CAPSULE | Freq: Every day | ORAL | Status: DC
  Administered 2017-03-02 – 2017-03-04 (×3): 150 mg via ORAL
  Filled 2017-03-02: qty 3
  Filled 2017-03-02 (×2): qty 2

## 2017-03-02 MED ORDER — LISINOPRIL 5 MG PO TABS
5.0000 mg | ORAL_TABLET | Freq: Every day | ORAL | Status: DC
  Administered 2017-03-02 – 2017-03-05 (×4): 5 mg via ORAL
  Filled 2017-03-02 (×4): qty 1

## 2017-03-02 MED ORDER — QUETIAPINE FUMARATE 25 MG PO TABS
25.0000 mg | ORAL_TABLET | Freq: Every day | ORAL | Status: DC
  Administered 2017-03-02 – 2017-03-04 (×3): 25 mg via ORAL
  Filled 2017-03-02 (×3): qty 1

## 2017-03-02 MED ORDER — POTASSIUM CHLORIDE 20 MEQ/15ML (10%) PO SOLN
20.0000 meq | Freq: Two times a day (BID) | ORAL | Status: DC
  Administered 2017-03-02 – 2017-03-05 (×7): 20 meq via ORAL
  Filled 2017-03-02 (×7): qty 15

## 2017-03-02 MED ORDER — FUROSEMIDE 10 MG/ML IJ SOLN
40.0000 mg | Freq: Once | INTRAMUSCULAR | Status: AC
  Administered 2017-03-02: 40 mg via INTRAVENOUS
  Filled 2017-03-02: qty 4

## 2017-03-02 NOTE — ED Notes (Signed)
ADMISSION MD Provider at bedside. 

## 2017-03-02 NOTE — ED Notes (Signed)
PT CHANGED INTO HOSPITAL BED. PT TOLERATED

## 2017-03-02 NOTE — ED Notes (Signed)
ED TO INPATIENT HANDOFF REPORT  Name/Age/Gender Pamela Peterson 84 y.o. female  Code Status    Code Status Orders  (From admission, onward)        Start     Ordered   03/02/17 0239  Do not attempt resuscitation (DNR)  Continuous    Question Answer Comment  In the event of cardiac or respiratory ARREST Do not call a "code blue"   In the event of cardiac or respiratory ARREST Do not perform Intubation, CPR, defibrillation or ACLS   In the event of cardiac or respiratory ARREST Use medication by any route, position, wound care, and other measures to relive pain and suffering. May use oxygen, suction and manual treatment of airway obstruction as needed for comfort.      03/02/17 0240    Code Status History    Date Active Date Inactive Code Status Order ID Comments User Context   01/29/2017 23:41 01/31/2017 19:13 DNR [id number]  Nicolette Bang, DO Inpatient   09/22/2015 23:24 09/24/2015 15:11 Full Code [id number]  Reubin Milan, MD Inpatient   03/01/2015 02:45 03/02/2015 19:51 Full Code [id number]  Reubin Milan, MD Inpatient   01/23/2015 21:59 01/24/2015 16:18 Full Code [id number]  Norval Morton, MD Inpatient    Advance Directive Documentation     Most Recent Value  Type of Advance Directive  Healthcare Power of Attorney, Living will  Pre-existing out of facility DNR order (yellow form or pink MOST form)  No data  "MOST" Form in Place?  No data      Home/SNF/Other Nursing Home  Chief Complaint shortness of breath  Level of Care/Admitting Diagnosis ED Disposition    ED Disposition Condition Comment   Admit  H[facility] Area: [facility] [[id number]]  Level of Care: Telemetry [5]  Admit to tele based on following criteria: Acute CHF  Diagnosis: CHF, acute on chronic [facility]) [[id number]]  Admitting Physician: Vianne Bulls [[id number]]  Attending Physician: Vianne Bulls [[id number]]  PT Class (Do Not Modify): Observation [104]  PT Acc Code (Do Not  Modify): Observation [10022]       Medical History Past Medical History:  Diagnosis Date  . Anxiety   . CHF (congestive heart failure) ([place])   . Hyperlipidemia   . Hypertension   . Hypothyroid   . Osteoporosis     Allergies Allergies  Allergen Reactions  . Other Shortness Of Breath and Other (See Comments)    CONFUSION AND PHYSICALLY SICK: Detergents, formaldehyde, any strong smells  . Amoxicillin Other (See Comments)    List on MAR  . Azithromycin Other (See Comments)    Listed on MAR  . Metronidazole Other (See Comments)    Listed on MAR  . Penicillins Hives and Swelling    Has patient had a PCN reaction causing immediate rash, facial/tongue/throat swelling, SOB or lightheadedness with hypotension: Yes Has patient had a PCN reaction causing severe rash involving mucus membranes or skin necrosis: NO Has patient had a PCN reaction that required hospitalization already in hospital  Has patient had a PCN reaction occurring within the last 10 years: no If all of the above answers are "NO", then may proceed with Cephalosporin use.   . Sulfa Antibiotics Hives and Swelling  . Lorazepam Other (See Comments)    Restless legs    IV Location/Drains/Wounds Patient Lines/Drains/Airways Status   Active Line/Drains/Airways    Name:   Placement date:   Placement time:   Site:   Days:  Peripheral IV 01/29/17 Right Hand   01/29/17    2015    Hand   32   External Urinary Catheter   01/29/17    2115    -   32          Labs/Imaging Results for orders placed or performed during the hospital encounter of 03/01/17 (from the past 48 hour(s))  Basic metabolic panel     Status: Abnormal   Collection Time: 03/02/17  1:12 AM  Result Value Ref Range   Sodium 140 135 - 145 mmol/L   Potassium 3.6 3.5 - 5.1 mmol/L   Chloride 107 101 - 111 mmol/L   CO2 26 22 - 32 mmol/L   Glucose, Bld 136 (H) 65 - 99 mg/dL   BUN 20 6 - 20 mg/dL   Creatinine, Ser 0.83 0.44 - 1.00 mg/dL   Calcium 8.6 (L)  8.9 - 10.3 mg/dL   GFR calc non Af Amer 58 (L) >60 mL/min   GFR calc Af Amer >60 >60 mL/min    Comment: (NOTE) The eGFR has been calculated using the CKD EPI equation. This calculation has not been validated in all clinical situations. eGFR's persistently <60 mL/min signify possible Chronic Kidney Disease.    Anion gap 7 5 - 15  CBC with Differential     Status: Abnormal   Collection Time: 03/02/17  1:12 AM  Result Value Ref Range   WBC 6.2 4.0 - 10.5 K/uL   RBC 3.66 (L) 3.87 - 5.11 MIL/uL   Hemoglobin 10.9 (L) 12.0 - 15.0 g/dL   HCT 33.5 (L) 36.0 - 46.0 %   MCV 91.5 78.0 - 100.0 fL   MCH 29.8 26.0 - 34.0 pg   MCHC 32.5 30.0 - 36.0 g/dL   RDW 15.8 (H) 11.5 - 15.5 %   Platelets 171 150 - 400 K/uL   Neutrophils Relative % 86 %   Neutro Abs 5.3 1.7 - 7.7 K/uL   Lymphocytes Relative 9 %   Lymphs Abs 0.5 (L) 0.7 - 4.0 K/uL   Monocytes Relative 5 %   Monocytes Absolute 0.3 0.1 - 1.0 K/uL   Eosinophils Relative 0 %   Eosinophils Absolute 0.0 0.0 - 0.7 K/uL   Basophils Relative 0 %   Basophils Absolute 0.0 0.0 - 0.1 K/uL  Brain natriuretic peptide     Status: Abnormal   Collection Time: 03/02/17  1:12 AM  Result Value Ref Range   B Natriuretic Peptide 3,614.1 (H) 0.0 - 100.0 pg/mL  Troponin I     Status: None   Collection Time: 03/02/17  1:12 AM  Result Value Ref Range   Troponin I <0.03 <0.03 ng/mL   Dg Chest Port 1 View  Result Date: 03/02/2017 CLINICAL DATA:  84 year old with shortness of breath. EXAM: PORTABLE CHEST 1 VIEW COMPARISON:  Radiographs 01/29/2017 FINDINGS: Persistent moderate large left pleural effusion, unchanged. Persistent trace right pleural effusion. Persistent but improved pulmonary edema from prior exam. Unchanged heart size and mediastinal contours, left heart border obscured by pleural fluid. No pneumothorax. Probable scarring in the right mid lung. No acute osseous abnormalities are seen. IMPRESSION: 1. Unchanged moderate to large left and trace right  pleural effusion. Improved pulmonary edema from prior exam. 2. Probable right midlung scarring. Electronically Signed   By: Jeb Levering M.D.   On: 03/02/2017 00:33    Pending Labs Unresulted Labs (From admission, onward)   Start     Ordered   03/09/17 0500  Creatinine, serum  (  enoxaparin (LOVENOX)    CrCl >/= 30 ml/min)  Weekly,   R    Comments:  while on enoxaparin therapy    03/02/17 0240   03/03/17 6004  Basic metabolic panel  Daily,   R     03/02/17 0240   03/03/17 0500  CBC with Differential/Platelet  Tomorrow morning,   R     03/02/17 1218   03/03/17 0500  Magnesium  Tomorrow morning,   R     03/02/17 1218      Vitals/Pain Today's Vitals   03/02/17 1215 03/02/17 1230 03/02/17 1300 03/02/17 1339  BP:  (!) 147/82 (!) 145/78 (!) 137/59  Pulse: 67 71 (!) 25 62  Resp: (!) 22 (!) 21 (!) 21 (!) 21  Temp:      TempSrc:      SpO2: 99% 97% 100% 98%    Isolation Precautions No active isolations  Medications Medications  aspirin EC tablet 81 mg (81 mg Oral Given 03/02/17 1229)  busPIRone (BUSPAR) tablet 5 mg (5 mg Oral Given 03/02/17 1241)  pantoprazole (PROTONIX) EC tablet 40 mg (40 mg Oral Given 03/02/17 1228)  levothyroxine (SYNTHROID, LEVOTHROID) tablet 50 mcg (50 mcg Oral Given 03/02/17 1229)  lisinopril (PRINIVIL,ZESTRIL) tablet 5 mg (5 mg Oral Given 03/02/17 1231)  metoprolol tartrate (LOPRESSOR) tablet 50 mg (50 mg Oral Given 03/02/17 1230)  mirtazapine (REMERON) tablet 7.5 mg (not administered)  QUEtiapine (SEROQUEL) tablet 25 mg (not administered)  sodium chloride flush (NS) 0.9 % injection 3 mL (3 mLs Intravenous Given 03/02/17 1214)  sodium chloride flush (NS) 0.9 % injection 3 mL (3 mLs Intravenous Given 03/02/17 1215)  0.9 %  sodium chloride infusion (not administered)  acetaminophen (TYLENOL) tablet 650 mg (not administered)  ondansetron (ZOFRAN) injection 4 mg (not administered)  enoxaparin (LOVENOX) injection 30 mg (30 mg Subcutaneous Given 03/02/17 1226)  furosemide  (LASIX) injection 40 mg (40 mg Intravenous Given 03/02/17 1222)  potassium chloride 20 MEQ/15ML (10%) solution 20 mEq (20 mEq Oral Given 03/02/17 1228)  pregabalin (LYRICA) capsule 150 mg (150 mg Oral Given 03/02/17 1241)  furosemide (LASIX) injection 40 mg (40 mg Intravenous Given 03/02/17 0112)    Mobility walks with device

## 2017-03-02 NOTE — H&P (Addendum)
History and Physical    Pamela Peterson ZOX:[id number] DOB: 11-Nov-1919 DOA: 03/01/2017  PCP: Leola Brazil, DO   Patient coming from: Nursing facility   Chief Complaint: Bilateral leg swelling, respiratory distress   HPI: Pamela Peterson is a 84 y.o. female with medical history significant for chronic CHF, depression with anxiety, hypertension, dementia, and hypothyroidism, now presenting to the emergency department from her nursing facility for evaluation of worsening peripheral edema and dyspnea.  Patient's daughter reports that when she visited the patient early today, she seemed to be in her usual state, but was then notified by nursing facility staff that the patient seemed to be in respiratory distress. Patient is unable to contribute to the history secondary to her underlying dementia.  ED Course: Upon arrival to the ED, patient is found to be afebrile, saturating low 90s on room air, and.  Chest x-ray features unchanged moderate to large left, and trace right pleural effusions, as well as improved pulmonary edema since the prior study.  Chemistry panel is unremarkable and CBC is notable for a improved chronic normocytic anemia with hemoglobin of 10.9.  BNP is elevated to 3614 and troponin is undetectable.  The patient was treated with 40 mg IV Lasix in the ED.  She remains hemodynamically stable, in no acute distress, and will be observed on the telemetry unit for ongoing evaluation and management of acute on chronic CHF.  Review of Systems:  Unable to complete ROS secondary to patient's clinical condition with dementia.  Past Medical History:  Diagnosis Date  . Anxiety   . CHF (congestive heart failure) (HCC)   . Hyperlipidemia   . Hypertension   . Hypothyroid   . Osteoporosis     Past Surgical History:  Procedure Laterality Date  . BLADDER SURGERY       reports that  has never smoked. she has never used smokeless tobacco. She reports that she does not drink alcohol or use  drugs.  Allergies  Allergen Reactions  . Other Shortness Of Breath and Other (See Comments)    CONFUSION AND PHYSICALLY SICK: Detergents, formaldehyde, any strong smells  . Amoxicillin Other (See Comments)    List on MAR  . Azithromycin Other (See Comments)    Listed on MAR  . Metronidazole Other (See Comments)    Listed on MAR  . Penicillins Hives and Swelling    Has patient had a PCN reaction causing immediate rash, facial/tongue/throat swelling, SOB or lightheadedness with hypotension: Yes Has patient had a PCN reaction causing severe rash involving mucus membranes or skin necrosis: NO Has patient had a PCN reaction that required hospitalization already in hospital  Has patient had a PCN reaction occurring within the last 10 years: no If all of the above answers are "NO", then may proceed with Cephalosporin use.   . Sulfa Antibiotics Hives and Swelling  . Lorazepam Other (See Comments)    Restless legs    Family History  Problem Relation Age of Onset  . Heart attack Brother   . COPD Sister   . Cancer Brother   . Tuberculosis Mother   . Heart attack Father   . Alcohol abuse Father      Prior to Admission medications   Medication Sig Start Date End Date Taking? Authorizing Provider  acetaminophen (TYLENOL) 325 MG tablet Take 325-650 mg by mouth every 8 (eight) hours as needed for mild pain or moderate pain.    [provider]  aspirin EC 81 MG tablet Take 81  mg by mouth daily.    [provider]  busPIRone (BUSPAR) 5 MG tablet Take 5 mg by mouth 3 (three) times daily.    [provider]  calcium-vitamin D (OSCAL WITH D) 250-125 MG-UNIT tablet Take 1 tablet by mouth daily.    [provider]  esomeprazole (NEXIUM) 40 MG capsule Take 40 mg by mouth daily.     [provider]  furosemide (LASIX) 40 MG tablet Alternate between 40 and 20 mg daily (40mg  every M/W/F, 20 mg on T/Th/Sat/Sun) 01/31/17   Oralia Manis, DO  levothyroxine  (SYNTHROID, LEVOTHROID) 50 MCG tablet Take 50 mcg by mouth at bedtime.     [provider]  lisinopril (PRINIVIL,ZESTRIL) 5 MG tablet Take 5 mg by mouth daily.    [provider]  magnesium oxide (MAG-OX) 400 MG tablet Take 400 mg by mouth daily.    [provider]  metoprolol tartrate (LOPRESSOR) 25 MG tablet Take 50 mg by mouth daily.    [provider]  mirtazapine (REMERON) 7.5 MG tablet Take 7.5 mg by mouth at bedtime.    [provider]  Multiple Vitamins-Minerals (MULTIVITAMIN & MINERAL PO) Take 1 tablet by mouth daily.    [provider]  ondansetron (ZOFRAN ODT) 8 MG disintegrating tablet 8mg  ODT q8 hours prn nausea Patient not taking: Reported on 01/29/2017 11/12/15   Palumbo, April, MD  Potassium Hydroxide 10 % SOLN Take 7.5 mLs by mouth daily.    [provider]  pregabalin (LYRICA) 150 MG capsule Take 1 capsule (150 mg total) by mouth 2 (two) times daily. 01/31/17 01/31/18  Oralia Manis, DO  QUEtiapine (SEROQUEL) 25 MG tablet Take 25 mg by mouth at bedtime.    [provider]    Physical Exam: Vitals:   03/01/17 2331 03/01/17 2334 03/02/17 0150 03/02/17 0234  BP:  (!) 135/121 (!) 162/80 (!) 165/76  Pulse:  73 68 76  Resp:  12 16 16   Temp:  97.8 F (36.6 C)    TempSrc:  Oral    SpO2: 90% 100% 90% 93%      Constitutional: NAD, calm, cachectic Eyes: PERTLA, lids and conjunctivae normal ENMT: Mucous membranes are moist. Posterior pharynx clear of any exudate or lesions.   Neck: normal, supple, no masses, no thyromegaly Respiratory: Diminished breath sound bilaterally. No accessory muscle use.  Cardiovascular: S1 & S2 heard, regular rate and rhythm. Trace pedal edema bilaterally.  Abdomen: No distension, no tenderness, no masses palpated. Bowel sounds normal.  Musculoskeletal: no clubbing / cyanosis. No joint deformity upper and lower extremities.  Skin: no significant rashes, lesions, ulcers. Warm, dry,  well-perfused. Neurologic: No facial asymmetry. Gross hearing deficit. Moving all extremities.  Psychiatric: Alert, but disoriented. Calm.     Labs on Admission: I have personally reviewed following labs and imaging studies  CBC: Recent Labs  Lab 03/02/17 0112  WBC 6.2  NEUTROABS 5.3  HGB 10.9*  HCT 33.5*  MCV 91.5  PLT 171   Basic Metabolic Panel: Recent Labs  Lab 03/02/17 0112  NA 140  K 3.6  CL 107  CO2 26  GLUCOSE 136*  BUN 20  CREATININE 0.83  CALCIUM 8.6*   GFR: CrCl cannot be calculated (Unknown ideal weight.). Liver Function Tests: No results for input(s): AST, ALT, ALKPHOS, BILITOT, PROT, ALBUMIN in the last 168 hours. No results for input(s): LIPASE, AMYLASE in the last 168 hours. No results for input(s): AMMONIA in the last 168 hours. Coagulation Profile: No results  for input(s): INR, PROTIME in the last 168 hours. Cardiac Enzymes: Recent Labs  Lab 03/02/17 0112  TROPONINI <0.03   BNP (last 3 results) No results for input(s): PROBNP in the last 8760 hours. HbA1C: No results for input(s): HGBA1C in the last 72 hours. CBG: No results for input(s): GLUCAP in the last 168 hours. Lipid Profile: No results for input(s): CHOL, HDL, LDLCALC, TRIG, CHOLHDL, LDLDIRECT in the last 72 hours. Thyroid Function Tests: No results for input(s): TSH, T4TOTAL, FREET4, T3FREE, THYROIDAB in the last 72 hours. Anemia Panel: No results for input(s): VITAMINB12, FOLATE, FERRITIN, TIBC, IRON, RETICCTPCT in the last 72 hours. Urine analysis:    Component Value Date/Time   COLORURINE YELLOW 01/29/2017 1549   APPEARANCEUR CLEAR 01/29/2017 1549   LABSPEC 1.013 01/29/2017 1549   PHURINE 6.0 01/29/2017 1549   GLUCOSEU NEGATIVE 01/29/2017 1549   HGBUR NEGATIVE 01/29/2017 1549   BILIRUBINUR NEGATIVE 01/29/2017 1549   KETONESUR NEGATIVE 01/29/2017 1549   PROTEINUR NEGATIVE 01/29/2017 1549   UROBILINOGEN 0.2 04/27/2014 2329   NITRITE NEGATIVE 01/29/2017 1549    LEUKOCYTESUR NEGATIVE 01/29/2017 1549   Sepsis Labs: @LABRCNTIP (procalcitonin:4,lacticidven:4) )No results found for this or any previous visit (from the past 240 hour(s)).   Radiological Exams on Admission: Dg Chest Port 1 View  Result Date: 03/02/2017 CLINICAL DATA:  84 year old with shortness of breath. EXAM: PORTABLE CHEST 1 VIEW COMPARISON:  Radiographs 01/29/2017 FINDINGS: Persistent moderate large left pleural effusion, unchanged. Persistent trace right pleural effusion. Persistent but improved pulmonary edema from prior exam. Unchanged heart size and mediastinal contours, left heart border obscured by pleural fluid. No pneumothorax. Probable scarring in the right mid lung. No acute osseous abnormalities are seen. IMPRESSION: 1. Unchanged moderate to large left and trace right pleural effusion. Improved pulmonary edema from prior exam. 2. Probable right midlung scarring. Electronically Signed   By: Rubye OaksMelanie  Ehinger M.D.   On: 03/02/2017 00:33    EKG: Not performed.   Assessment/Plan  1. Acute on chronic CHF; pleural effusions   - No echo report on file  - Presents with worsening SOB and peripheral edema  - CXR demonstrates improved edema since prior study  - Treated in ED with Lasix 40 mg IV  - Continue cardiac monitoring, SLIV, fluid-restrict diet, follow daily wt and I/O's, continue diuresis with Lasix 40 mg IV q12h, continue ACE, continue beta-blocker, follow daily chem panel, and check echocardiogram   - If fails to improve with diuresis, could consider therapeutic thoracentesis   2. Hypertension  - BP slightly elevated in setting of acute CHF, anticipate improvement with diuresis  - Continue lisinopril and metoprolol    3. Depression  - Continue Remeron, Buspar, and Seroquel   4. Hypothyroidism  - Continue Synthroid     DVT prophylaxis: Lovenox  Code Status: DNR Family Communication: Daughter updated at bedside Disposition Plan: Observe on telemetry Consults called:  None Admission status: Observation    Briscoe Deutscherimothy S Channing Yeager, MD [facility] Pager [phone]  If 7PM-7AM, please contact night-coverage [email or web address] Password [email or web address]  03/02/2017, 2:40 AM

## 2017-03-02 NOTE — Progress Notes (Signed)
Patient ID: Pamela Peterson, female   DOB: 04/23/1919, 84 y.o.   MRN: [id number] Patient was admitted early this morning for bilateral leg swelling, respiratory distress secondary to acute on chronic CHF and was started on intravenous Lasix.  Patient seen and examined at bedside.  Patient is awake but confused.  I reviewed the medical records including this morning's H&P myself.  Continue intravenous Lasix.  Follow echocardiogram.  Repeat chest x-ray 2 views for tomorrow to see if pleural effusion improves.  Repeat a.m. labs.

## 2017-03-03 ENCOUNTER — Observation Stay (HOSPITAL_BASED_OUTPATIENT_CLINIC_OR_DEPARTMENT_OTHER)

## 2017-03-03 ENCOUNTER — Observation Stay (HOSPITAL_COMMUNITY)

## 2017-03-03 DIAGNOSIS — Z882 Allergy status to sulfonamides status: Secondary | ICD-10-CM | POA: Diagnosis not present

## 2017-03-03 DIAGNOSIS — R0600 Dyspnea, unspecified: Secondary | ICD-10-CM | POA: Diagnosis present

## 2017-03-03 DIAGNOSIS — Z681 Body mass index (BMI) 19 or less, adult: Secondary | ICD-10-CM | POA: Diagnosis not present

## 2017-03-03 DIAGNOSIS — E785 Hyperlipidemia, unspecified: Secondary | ICD-10-CM | POA: Diagnosis present

## 2017-03-03 DIAGNOSIS — E039 Hypothyroidism, unspecified: Secondary | ICD-10-CM | POA: Diagnosis present

## 2017-03-03 DIAGNOSIS — M810 Age-related osteoporosis without current pathological fracture: Secondary | ICD-10-CM | POA: Diagnosis present

## 2017-03-03 DIAGNOSIS — D649 Anemia, unspecified: Secondary | ICD-10-CM | POA: Diagnosis present

## 2017-03-03 DIAGNOSIS — I110 Hypertensive heart disease with heart failure: Secondary | ICD-10-CM | POA: Diagnosis present

## 2017-03-03 DIAGNOSIS — I340 Nonrheumatic mitral (valve) insufficiency: Secondary | ICD-10-CM | POA: Diagnosis not present

## 2017-03-03 DIAGNOSIS — Z825 Family history of asthma and other chronic lower respiratory diseases: Secondary | ICD-10-CM | POA: Diagnosis not present

## 2017-03-03 DIAGNOSIS — R0603 Acute respiratory distress: Secondary | ICD-10-CM | POA: Diagnosis present

## 2017-03-03 DIAGNOSIS — F418 Other specified anxiety disorders: Secondary | ICD-10-CM | POA: Diagnosis present

## 2017-03-03 DIAGNOSIS — Z7982 Long term (current) use of aspirin: Secondary | ICD-10-CM | POA: Diagnosis not present

## 2017-03-03 DIAGNOSIS — E876 Hypokalemia: Secondary | ICD-10-CM | POA: Diagnosis present

## 2017-03-03 DIAGNOSIS — Z515 Encounter for palliative care: Secondary | ICD-10-CM | POA: Diagnosis present

## 2017-03-03 DIAGNOSIS — Z880 Allergy status to penicillin: Secondary | ICD-10-CM | POA: Diagnosis not present

## 2017-03-03 DIAGNOSIS — I5023 Acute on chronic systolic (congestive) heart failure: Secondary | ICD-10-CM | POA: Diagnosis not present

## 2017-03-03 DIAGNOSIS — F0390 Unspecified dementia without behavioral disturbance: Secondary | ICD-10-CM | POA: Diagnosis present

## 2017-03-03 DIAGNOSIS — Z8249 Family history of ischemic heart disease and other diseases of the circulatory system: Secondary | ICD-10-CM | POA: Diagnosis not present

## 2017-03-03 DIAGNOSIS — J449 Chronic obstructive pulmonary disease, unspecified: Secondary | ICD-10-CM | POA: Diagnosis present

## 2017-03-03 DIAGNOSIS — R64 Cachexia: Secondary | ICD-10-CM | POA: Diagnosis present

## 2017-03-03 DIAGNOSIS — F329 Major depressive disorder, single episode, unspecified: Secondary | ICD-10-CM | POA: Diagnosis present

## 2017-03-03 DIAGNOSIS — Z66 Do not resuscitate: Secondary | ICD-10-CM | POA: Diagnosis present

## 2017-03-03 LAB — BASIC METABOLIC PANEL
Anion gap: 10 (ref 5–15)
BUN: 23 mg/dL — ABNORMAL HIGH (ref 6–20)
CO2: 31 mmol/L (ref 22–32)
Calcium: 8.6 mg/dL — ABNORMAL LOW (ref 8.9–10.3)
Chloride: 101 mmol/L (ref 101–111)
Creatinine, Ser: 1.07 mg/dL — ABNORMAL HIGH (ref 0.44–1.00)
GFR calc Af Amer: 50 mL/min — ABNORMAL LOW (ref >60)
GFR calc non Af Amer: 43 mL/min — ABNORMAL LOW (ref >60)
Glucose, Bld: 107 mg/dL — ABNORMAL HIGH (ref 65–99)
Potassium: 3.4 mmol/L — ABNORMAL LOW (ref 3.5–5.1)
Sodium: 142 mmol/L (ref 135–145)

## 2017-03-03 LAB — ECHOCARDIOGRAM COMPLETE
Height: 58 in
Weight: 1322.76 oz

## 2017-03-03 LAB — CBC WITH DIFFERENTIAL/PLATELET
Basophils Absolute: 0 10*3/uL (ref 0.0–0.1)
Basophils Relative: 1 %
Eosinophils Absolute: 0.2 10*3/uL (ref 0.0–0.7)
Eosinophils Relative: 5 %
HCT: 34.9 % — ABNORMAL LOW (ref 36.0–46.0)
Hemoglobin: 11 g/dL — ABNORMAL LOW (ref 12.0–15.0)
Lymphocytes Relative: 23 %
Lymphs Abs: 1 10*3/uL (ref 0.7–4.0)
MCH: 28.8 pg (ref 26.0–34.0)
MCHC: 31.5 g/dL (ref 30.0–36.0)
MCV: 91.4 fL (ref 78.0–100.0)
Monocytes Absolute: 0.4 10*3/uL (ref 0.1–1.0)
Monocytes Relative: 10 %
Neutro Abs: 2.8 10*3/uL (ref 1.7–7.7)
Neutrophils Relative %: 61 %
Platelets: 177 10*3/uL (ref 150–400)
RBC: 3.82 MIL/uL — ABNORMAL LOW (ref 3.87–5.11)
RDW: 15.7 % — ABNORMAL HIGH (ref 11.5–15.5)
WBC: 4.5 10*3/uL (ref 4.0–10.5)

## 2017-03-03 LAB — MAGNESIUM: Magnesium: 1.3 mg/dL — ABNORMAL LOW (ref 1.7–2.4)

## 2017-03-03 MED ORDER — POTASSIUM CHLORIDE CRYS ER 20 MEQ PO TBCR: 20.0000 meq | EXTENDED_RELEASE_TABLET | Freq: Two times a day (BID) | ORAL | Status: DC

## 2017-03-03 MED ORDER — ALUM & MAG HYDROXIDE-SIMETH 200-200-20 MG/5ML PO SUSP
30.0000 mL | ORAL | Status: DC | PRN
  Administered 2017-03-03: 30 mL via ORAL
  Filled 2017-03-03: qty 30

## 2017-03-03 MED ORDER — POTASSIUM CHLORIDE CRYS ER 20 MEQ PO TBCR
40.0000 meq | EXTENDED_RELEASE_TABLET | Freq: Once | ORAL | Status: AC
  Administered 2017-03-03: 40 meq via ORAL
  Filled 2017-03-03: qty 2

## 2017-03-03 NOTE — Progress Notes (Addendum)
PROGRESS NOTE    Kyndal Heringer  JXB:[id number] DOB: [birth year] DOA: 03/01/2017 PCP: Leola Brazil, DO  Brief Narrative:  84 y.o. female with medical history significant for chronic CHF, depression with anxiety, hypertension, dementia, and hypothyroidism, now presenting to the emergency department from her nursing facility for evaluation of worsening peripheral edema and dyspnea.  Patient's daughter reports that when she visited the patient early today, she seemed to be in her usual state, but was then notified by nursing facility staff that the patient seemed to be in respiratory distress. Patient is unable to contribute to the history secondary to her underlying dementia.  ED Course: Upon arrival to the ED, patient is found to be afebrile, saturating low 90s on room air, and.  Chest x-ray features unchanged moderate to large left, and trace right pleural effusions, as well as improved pulmonary edema since the prior study.  Chemistry panel is unremarkable and CBC is notable for a improved chronic normocytic anemia with hemoglobin of 10.9.  BNP is elevated to 3614 and troponin is undetectable.  The patient was treated with 40 mg IV Lasix in the ED.  She remains hemodynamically stable, in no acute distress, and will be observed on the telemetry unit for ongoing evaluation and management of acute on chronic CHF.  03/03/2017-patient resting in bed feels her breathing is better.  She reports that she did not have a bowel movement today she is not sure if she had one yesterday.  She does not have oxygen prior to admission to the hospital.  Assessment & Plan:   Principal Problem:   CHF, acute on chronic [facility]) Active Problems:   Hypothyroidism   Essential hypertension   Depression   Pleural effusion  1] acute on chronic congestive heart failure patient currently on Lasix 40 mg IV every 12 along with beta-blocker and ACE inhibitor.  Echocardiogram has been done and the results are pending at this time.   Patient's family requesting palliative care consult will consult palliative care.  Repeat chest x-ray tomorrow to evaluate pleural effusion. 2. Hypertension  - BP slightly elevated in setting of acute CHF, anticipate improvement with diuresis  - Continue lisinopril and metoprolol    3. Depression  - Continue Remeron, Buspar, and Seroquel   4. Hypothyroidism  - Continue Synthroid   5] hypokalemia replete and recheck    DVT prophylaxis: Lovenox Code Status: DNR Family Communication: No family available Disposition Plan: TBD  Consultants: Palliative care pending Procedures: None Antimicrobials: None Subjective:still having trouble breathing on oxygen at 2 L Objective: Vitals:   03/02/17 1400 03/02/17 1500 03/02/17 2050 03/03/17 0413  BP: (!) 126/57 (!) 147/71 128/69 (!) 141/97  Pulse: (!) 58 (!) 58 61 70  Resp: 20 18 17 17   Temp:   98 F (36.7 C) 97.7 F (36.5 C)  TempSrc:   Oral Oral  SpO2: 95% 98% 97% 92%  Weight:  38.1 kg (83 lb 15.9 oz)  37.5 kg (82 lb 10.8 oz)  Height:  4\' 10"  (1.473 m)      Intake/Output Summary (Last 24 hours) at 03/03/2017 1351 Last data filed at 03/03/2017 0700 Gross per 24 hour  Intake -  Output 750 ml  Net -750 ml   Filed Weights   03/02/17 1500 03/03/17 0413  Weight: 38.1 kg (83 lb 15.9 oz) 37.5 kg (82 lb 10.8 oz)    Examination:  General exam: Appears calm and comfortable  Respiratory system: few scattered rhonchi auscultation. Respiratory effort normal. Cardiovascular system: S1 &  S2 heard, RRR. No JVD, murmurs, rubs, gallops or clicks. No pedal edema. Gastrointestinal system: Abdomen is nondistended, soft and nontender. No organomegaly or masses felt. Normal bowel sounds heard. Central nervous system: Alert and oriented. No focal neurological deficits. Extremities: Symmetric 5 x 5 power. Skin: No rashes, lesions or ulcers Psychiatry: Judgement and insight appear normal. Mood & affect appropriate.     Data Reviewed: I have  personally reviewed following labs and imaging studies  CBC: Recent Labs  Lab 03/02/17 0112 03/03/17 0430  WBC 6.2 4.5  NEUTROABS 5.3 2.8  HGB 10.9* 11.0*  HCT 33.5* 34.9*  MCV 91.5 91.4  PLT 171 177   Basic Metabolic Panel: Recent Labs  Lab 03/02/17 0112 03/03/17 0430  NA 140 142  K 3.6 3.4*  CL 107 101  CO2 26 31  GLUCOSE 136* 107*  BUN 20 23*  CREATININE 0.83 1.07*  CALCIUM 8.6* 8.6*  MG  --  1.3*   GFR: Estimated Creatinine Clearance: 18.6 mL/min (A) (by C-G formula based on SCr of 1.07 mg/dL (H)). Liver Function Tests: No results for input(s): AST, ALT, ALKPHOS, BILITOT, PROT, ALBUMIN in the last 168 hours. No results for input(s): LIPASE, AMYLASE in the last 168 hours. No results for input(s): AMMONIA in the last 168 hours. Coagulation Profile: No results for input(s): INR, PROTIME in the last 168 hours. Cardiac Enzymes: Recent Labs  Lab 03/02/17 0112  TROPONINI <0.03   BNP (last 3 results) No results for input(s): PROBNP in the last 8760 hours. HbA1C: No results for input(s): HGBA1C in the last 72 hours. CBG: No results for input(s): GLUCAP in the last 168 hours. Lipid Profile: No results for input(s): CHOL, HDL, LDLCALC, TRIG, CHOLHDL, LDLDIRECT in the last 72 hours. Thyroid Function Tests: No results for input(s): TSH, T4TOTAL, FREET4, T3FREE, THYROIDAB in the last 72 hours. Anemia Panel: No results for input(s): VITAMINB12, FOLATE, FERRITIN, TIBC, IRON, RETICCTPCT in the last 72 hours. Sepsis Labs: No results for input(s): PROCALCITON, LATICACIDVEN in the last 168 hours.  No results found for this or any previous visit (from the past 240 hour(s)).       Radiology Studies: Dg Chest 2 View  Result Date: 03/03/2017 CLINICAL DATA:  Dyspnea.  History of CHF and hypertension. EXAM: CHEST  2 VIEW COMPARISON:  03/02/2017; 01/29/2017; 01/24/2015; Decubitus radiographs - earlier same day FINDINGS: Grossly unchanged enlarged cardiac silhouette and  mediastinal contours with persistent obscuration of left heart border secondary to moderate size left-sided effusion. Atherosclerotic plaque within the thoracic aorta. There is persistent rightward deviation the tracheal air, the level of the aortic arch. The pulmonary vasculature appears slightly improved on the present examination. Improved aeration the right lung base. Punctate granuloma overlie the right lower lung. No pneumothorax. No acute osseus abnormalities. Severe (approximately 75%) compression deformities involving the approximate T7 and T9 vertebral bodies appear similar to remote chest radiograph performed 01/24/2015. IMPRESSION: 1. Unchanged moderate size left-sided effusion with associated left basilar opacities, atelectasis versus infiltrate. 2. Suspected improving pulmonary edema and right basilar atelectasis. 3. Old approximate T7 and T9 compression fractures. Electronically Signed   By: Simonne ComeJohn  Watts M.D.   On: 03/03/2017 11:44   Dg Chest Bilateral Decubitus  Result Date: 03/03/2017 CLINICAL DATA:  Shortness of breath EXAM: CHEST - BILATERAL DECUBITUS VIEW COMPARISON:  Chest radiographs dated 03/02/2017 FINDINGS: Moderate left pleural effusion layers on the left decubitus view, although remains minimally loculated on the right decubitus view. Underlying left lower lobe opacity, likely compressive atelectasis. No  pneumothorax. IMPRESSION: Moderate left pleural effusion, minimally loculated. Electronically Signed   By: Charline Bills M.D.   On: 03/03/2017 11:38   Dg Chest Port 1 View  Result Date: 03/02/2017 CLINICAL DATA:  84 year old with shortness of breath. EXAM: PORTABLE CHEST 1 VIEW COMPARISON:  Radiographs 01/29/2017 FINDINGS: Persistent moderate large left pleural effusion, unchanged. Persistent trace right pleural effusion. Persistent but improved pulmonary edema from prior exam. Unchanged heart size and mediastinal contours, left heart border obscured by pleural fluid. No  pneumothorax. Probable scarring in the right mid lung. No acute osseous abnormalities are seen. IMPRESSION: 1. Unchanged moderate to large left and trace right pleural effusion. Improved pulmonary edema from prior exam. 2. Probable right midlung scarring. Electronically Signed   By: Rubye Oaks M.D.   On: 03/02/2017 00:33        Scheduled Meds: . aspirin EC  81 mg Oral Daily  . busPIRone  5 mg Oral TID  . enoxaparin (LOVENOX) injection  30 mg Subcutaneous Q24H  . furosemide  40 mg Intravenous Q12H  . levothyroxine  50 mcg Oral QAC breakfast  . lisinopril  5 mg Oral Daily  . mouth rinse  15 mL Mouth Rinse BID  . metoprolol tartrate  50 mg Oral Daily  . mirtazapine  7.5 mg Oral QHS  . pantoprazole  40 mg Oral Daily  . potassium chloride  20 mEq Oral BID  . pregabalin  150 mg Oral Daily  . QUEtiapine  25 mg Oral QHS  . sodium chloride flush  3 mL Intravenous Q12H   Continuous Infusions: . sodium chloride       LOS: 0 days       Alwyn Ren, MD [facility]  If 7PM-7AM, please contact night-coverage [email or web address] Password [email or web address] 03/03/2017, 1:51 PM

## 2017-03-03 NOTE — Evaluation (Signed)
Physical Therapy Evaluation Patient Details Name: Pamela Peterson MRN: [id number] DOB: 01-20-1920 Today's Date: 03/03/2017   History of Present Illness  84 y.o. female with medical history significant for chronic CHF, depression with anxiety, hypertension, dementia, and hypothyroidism, now presenting to the emergency department from her nursing facility for evaluation of worsening peripheral edema and dyspnea. Dx of CHF.  Clinical Impression  Pt admitted with above diagnosis. Pt currently with functional limitations due to the deficits listed below (see PT Problem List). Pt ambulated 120' with min/guard assist and RW, no loss of balance, HR 70s, no dyspnea.  Pt will benefit from skilled PT during acute stay to increase their independence and safety with mobility to allow discharge to the venue listed below.       Follow Up Recommendations No PT following acute stay    Equipment Recommendations  None recommended by PT    Recommendations for Other Services       Precautions / Restrictions Precautions Precautions: None Precaution Comments: pt denies h/o falls in past 1 year Restrictions Weight Bearing Restrictions: No      Mobility  Bed Mobility               General bed mobility comments: up in chair  Transfers Overall transfer level: Needs assistance Equipment used: Rolling walker (2 wheeled) Transfers: Sit to/from Stand Sit to Stand: Min guard         General transfer comment: min guard for safety  Ambulation/Gait Ambulation/Gait assistance: Min guard Ambulation Distance (Feet): 120 Feet Assistive device: Rolling walker (2 wheeled) Gait Pattern/deviations: Step-through pattern;Trunk flexed   Gait velocity interpretation: at or above normal speed for age/gender General Gait Details: pt with very kyphotic posture (h/o osteoporosis), steady with RW, no loss of balance; HR 70s, no dyspnea  Stairs            Wheelchair Mobility    Modified Rankin (Stroke  Patients Only)       Balance Overall balance assessment: Modified Independent                                           Pertinent Vitals/Pain Pain Assessment: No/denies pain    Home Living Family/patient expects to be discharged to:: Assisted living               Home Equipment: Walker - 4 wheels;Shower seat;Grab bars - tub/shower;Grab bars - toilet Additional Comments: From [facility] in [place]    Prior Function Level of Independence: Needs assistance   Gait / Transfers Assistance Needed: walks with a rollator with her 02 in her basket  ADL's / Homemaking Assistance Needed: can take herself to the bathroom and dining room, assisted for bathing and dressing and all homemaking        Hand Dominance   Dominant Hand: Right    Extremity/Trunk Assessment   Upper Extremity Assessment Upper Extremity Assessment: Overall WFL for tasks assessed    Lower Extremity Assessment Lower Extremity Assessment: Overall WFL for tasks assessed    Cervical / Trunk Assessment Cervical / Trunk Assessment: Kyphotic  Communication   Communication: HOH  Cognition Arousal/Alertness: Awake/alert Behavior During Therapy: WFL for tasks assessed/performed Overall Cognitive Status: Within Functional Limits for tasks assessed  General Comments      Exercises     Assessment/Plan    PT Assessment Patient needs continued PT services  PT Problem List Decreased activity tolerance       PT Treatment Interventions Gait training;Therapeutic activities;Therapeutic exercise    PT Goals (Current goals can be found in the Care Plan section)  Acute Rehab PT Goals Patient Stated Goal: be able to walk to dining room at ALF PT Goal Formulation: With patient Time For Goal Achievement: 03/17/17 Potential to Achieve Goals: Good    Frequency Min 3X/week   Barriers to discharge        Co-evaluation                AM-PAC PT "6 Clicks" Daily Activity  Outcome Measure Difficulty turning over in bed (including adjusting bedclothes, sheets and blankets)?: A Little Difficulty moving from lying on back to sitting on the side of the bed? : A Little Difficulty sitting down on and standing up from a chair with arms (e.g., wheelchair, bedside commode, etc,.)?: A Little Help needed moving to and from a bed to chair (including a wheelchair)?: A Little Help needed walking in hospital room?: A Little Help needed climbing 3-5 steps with a railing? : A Lot 6 Click Score: 17    End of Session Equipment Utilized During Treatment: Gait belt Activity Tolerance: Patient tolerated treatment well Patient left: in chair;with call bell/phone within reach;with chair alarm set Nurse Communication: Mobility status PT Visit Diagnosis: Difficulty in walking, not elsewhere classified (R26.2)    Time: [id number] PT Time Calculation (min) (ACUTE ONLY): 10 min   Charges:   PT Evaluation $PT Eval Low Complexity: 1 Low     PT G Codes:          Pamela Peterson, Venetia Prewitt Kistler 03/03/2017, 12:40 PM [phone]

## 2017-03-03 NOTE — Progress Notes (Signed)
  Echocardiogram 2D Echocardiogram has been performed.  Pamela Peterson, Pamela Peterson 03/03/2017, 10:04 AM

## 2017-03-04 DIAGNOSIS — I5023 Acute on chronic systolic (congestive) heart failure: Secondary | ICD-10-CM

## 2017-03-04 LAB — BASIC METABOLIC PANEL
Anion gap: 5 (ref 5–15)
BUN: 34 mg/dL — AB (ref 6–20)
CO2: 33 mmol/L — ABNORMAL HIGH (ref 22–32)
CREATININE: 1.42 mg/dL — AB (ref 0.44–1.00)
Calcium: 8.4 mg/dL — ABNORMAL LOW (ref 8.9–10.3)
Chloride: 104 mmol/L (ref 101–111)
GFR calc Af Amer: 35 mL/min — ABNORMAL LOW (ref >60)
GFR, EST NON AFRICAN AMERICAN: 30 mL/min — AB (ref >60)
GLUCOSE: 112 mg/dL — AB (ref 65–99)
Potassium: 4.6 mmol/L (ref 3.5–5.1)
SODIUM: 142 mmol/L (ref 135–145)

## 2017-03-04 MED ORDER — MORPHINE SULFATE (CONCENTRATE) 10 MG/0.5ML PO SOLN: 5.0000 mg | ORAL | Status: DC | PRN

## 2017-03-04 NOTE — Progress Notes (Addendum)
PROGRESS NOTE    Pamela Peterson  ZOX:[id number] DOB: [birth year] DOA: 03/01/2017 PCP: Leola Brazil, DO   Brief Narrative:  84 y.o.femalewith medical history significant forchronic CHF, depression with anxiety, hypertension, dementia, and hypothyroidism, now presenting to the emergency department from her nursing facility for evaluation of worsening peripheral edema and dyspnea. Patient's daughter reports that when she visited the patient earlytoday, sheseemed to be in her usual state, but was then notified by nursing facility staff that the patient seemed to be in respiratory distress.Patient is unable to contribute to the history secondary to her underlying dementia.  ED Course:Upon arrival to the ED, patient is found to be afebrile, saturating low 90s on room air, and. Chest x-ray features unchanged moderate to large left, and trace right pleural effusions, as well as improved pulmonary edema since the prior study. Chemistry panel is unremarkable and CBC is notable for a improved chronic normocytic anemia with hemoglobin of 10.9. BNP is elevated to 3614 and troponin is undetectable. The patient was treated with 40 mg IV Lasix in the ED. She remains hemodynamically stable, in no acute distress, and will be observed on the telemetry unit for ongoing evaluation and management of acute on chronic CHF.  03/03/2017-patient resting in bed feels her breathing is better.  She reports that she did not have a bowel movement today she is not sure if she had one yesterday.  She does not have oxygen prior to admission to the hospital.     Assessment & Plan:   Principal Problem:   CHF, acute on chronic [facility]) Active Problems:   Hypothyroidism   Essential hypertension   Depression   Pleural effusion  1] acute on chronic congestive heart failure patient currently on Lasix 40 mg IV every 12 along with beta-blocker and ACE inhibitor.  Echocardiogram ejection fraction 25-30%.  Nursing report  patient's family has been talking to hospice of [facility] therefore I will cancel palliative care consult and she can be discharged back to the care facility with hospice upon discharge.  I will call her daughter today and discussed with her.  Her oxygen saturation was 93% on room air. 2.Hypertension -BP slightly elevated in setting of acute CHF, anticipate improvement with diuresis -Continue lisinopril and metoprolol  3.Depression -Continue Remeron, Buspar, and Seroquel  4.Hypothyroidism -Continue Synthroid 5] hypokalemia replete and recheck       DVT prophylaxis Lovenox Code Status: DNR Family Communication: Discussed with daughter plan is to discharge her back to [place] with hospice following.  Daughter requested morphine sublingual drops which will be ordered. Disposition Plan: To [facility] with hospice following tomorrow. Consultants: None  Procedures: None antimicrobials: None Subjective: No new complaints  Objective: Sweet lady resting in bed in no acute distress Vitals:   03/03/17 0413 03/03/17 1522 03/03/17 2119 03/04/17 0546  BP: (!) 141/97 108/66 (!) 106/51 (!) 107/42  Pulse: 70 68 71 (!) 58  Resp: 17 12 14 16   Temp: 97.7 F (36.5 C) 98.2 F (36.8 C) 97.6 F (36.4 C) 97.9 F (36.6 C)  TempSrc: Oral Oral Oral Axillary  SpO2: 92% 100% 98% 100%  Weight: 37.5 kg (82 lb 10.8 oz)   36.1 kg (79 lb 9.4 oz)  Height:        Intake/Output Summary (Last 24 hours) at 03/04/2017 1213 Last data filed at 03/04/2017 0900 Gross per 24 hour  Intake 480 ml  Output 851 ml  Net -371 ml   Filed Weights   03/02/17 1500 03/03/17 0413 03/04/17 0546  Weight: 38.1 kg (83 lb 15.9 oz) 37.5 kg (82 lb 10.8 oz) 36.1 kg (79 lb 9.4 oz)    Examination:  General exam: Appears calm and comfortable  Respiratory system: Clear to auscultation. Respiratory effort normal. Cardiovascular system: S1 & S2 heard, RRR. No JVD, murmurs, rubs, gallops or clicks. No pedal  edema. Gastrointestinal system: Abdomen is nondistended, soft and nontender. No organomegaly or masses felt. Normal bowel sounds heard. Central nervous system: Alert and oriented. No focal neurological deficits. Extremities: Symmetric 5 x 5 power. Skin: No rashes, lesions or ulcers Psychiatry: Judgement and insight appear normal. Mood & affect appropriate.     Data Reviewed: I have personally reviewed following labs and imaging studies  CBC: Recent Labs  Lab 03/02/17 0112 03/03/17 0430  WBC 6.2 4.5  NEUTROABS 5.3 2.8  HGB 10.9* 11.0*  HCT 33.5* 34.9*  MCV 91.5 91.4  PLT 171 177   Basic Metabolic Panel: Recent Labs  Lab 03/02/17 0112 03/03/17 0430 03/04/17 0539  NA 140 142 142  K 3.6 3.4* 4.6  CL 107 101 104  CO2 26 31 33*  GLUCOSE 136* 107* 112*  BUN 20 23* 34*  CREATININE 0.83 1.07* 1.42*  CALCIUM 8.6* 8.6* 8.4*  MG  --  1.3*  --    GFR: Estimated Creatinine Clearance: 13.5 mL/min (A) (by C-G formula based on SCr of 1.42 mg/dL (H)). Liver Function Tests: No results for input(s): AST, ALT, ALKPHOS, BILITOT, PROT, ALBUMIN in the last 168 hours. No results for input(s): LIPASE, AMYLASE in the last 168 hours. No results for input(s): AMMONIA in the last 168 hours. Coagulation Profile: No results for input(s): INR, PROTIME in the last 168 hours. Cardiac Enzymes: Recent Labs  Lab 03/02/17 0112  TROPONINI <0.03   BNP (last 3 results) No results for input(s): PROBNP in the last 8760 hours. HbA1C: No results for input(s): HGBA1C in the last 72 hours. CBG: No results for input(s): GLUCAP in the last 168 hours. Lipid Profile: No results for input(s): CHOL, HDL, LDLCALC, TRIG, CHOLHDL, LDLDIRECT in the last 72 hours. Thyroid Function Tests: No results for input(s): TSH, T4TOTAL, FREET4, T3FREE, THYROIDAB in the last 72 hours. Anemia Panel: No results for input(s): VITAMINB12, FOLATE, FERRITIN, TIBC, IRON, RETICCTPCT in the last 72 hours. Sepsis Labs: No results  for input(s): PROCALCITON, LATICACIDVEN in the last 168 hours.  No results found for this or any previous visit (from the past 240 hour(s)).       Radiology Studies: Dg Chest 2 View  Result Date: 03/03/2017 CLINICAL DATA:  Dyspnea.  History of CHF and hypertension. EXAM: CHEST  2 VIEW COMPARISON:  03/02/2017; 01/29/2017; 01/24/2015; Decubitus radiographs - earlier same day FINDINGS: Grossly unchanged enlarged cardiac silhouette and mediastinal contours with persistent obscuration of left heart border secondary to moderate size left-sided effusion. Atherosclerotic plaque within the thoracic aorta. There is persistent rightward deviation the tracheal air, the level of the aortic arch. The pulmonary vasculature appears slightly improved on the present examination. Improved aeration the right lung base. Punctate granuloma overlie the right lower lung. No pneumothorax. No acute osseus abnormalities. Severe (approximately 75%) compression deformities involving the approximate T7 and T9 vertebral bodies appear similar to remote chest radiograph performed 01/24/2015. IMPRESSION: 1. Unchanged moderate size left-sided effusion with associated left basilar opacities, atelectasis versus infiltrate. 2. Suspected improving pulmonary edema and right basilar atelectasis. 3. Old approximate T7 and T9 compression fractures. Electronically Signed   By: Simonne ComeJohn  Watts M.D.   On: 03/03/2017 11:44  Dg Chest Bilateral Decubitus  Result Date: 03/03/2017 CLINICAL DATA:  Shortness of breath EXAM: CHEST - BILATERAL DECUBITUS VIEW COMPARISON:  Chest radiographs dated 03/02/2017 FINDINGS: Moderate left pleural effusion layers on the left decubitus view, although remains minimally loculated on the right decubitus view. Underlying left lower lobe opacity, likely compressive atelectasis. No pneumothorax. IMPRESSION: Moderate left pleural effusion, minimally loculated. Electronically Signed   By: Charline Bills M.D.   On: 03/03/2017  11:38        Scheduled Meds: . aspirin EC  81 mg Oral Daily  . busPIRone  5 mg Oral TID  . enoxaparin (LOVENOX) injection  30 mg Subcutaneous Q24H  . furosemide  40 mg Intravenous Q12H  . levothyroxine  50 mcg Oral QAC breakfast  . lisinopril  5 mg Oral Daily  . mouth rinse  15 mL Mouth Rinse BID  . metoprolol tartrate  50 mg Oral Daily  . mirtazapine  7.5 mg Oral QHS  . pantoprazole  40 mg Oral Daily  . potassium chloride  20 mEq Oral BID  . pregabalin  150 mg Oral Daily  . QUEtiapine  25 mg Oral QHS  . sodium chloride flush  3 mL Intravenous Q12H   Continuous Infusions: . sodium chloride       LOS: 1 day      Alwyn Ren, MD [facility]  If 7PM-7AM, please contact night-coverage [email or web address] Password [email or web address] 03/04/2017, 12:13 PM

## 2017-03-05 LAB — BASIC METABOLIC PANEL
Anion gap: 8 (ref 5–15)
BUN: 35 mg/dL — ABNORMAL HIGH (ref 6–20)
CALCIUM: 8.6 mg/dL — AB (ref 8.9–10.3)
CO2: 29 mmol/L (ref 22–32)
CREATININE: 1.45 mg/dL — AB (ref 0.44–1.00)
Chloride: 101 mmol/L (ref 101–111)
GFR calc non Af Amer: 30 mL/min — ABNORMAL LOW (ref >60)
GFR, EST AFRICAN AMERICAN: 34 mL/min — AB (ref >60)
GLUCOSE: 99 mg/dL (ref 65–99)
Potassium: 4.5 mmol/L (ref 3.5–5.1)
Sodium: 138 mmol/L (ref 135–145)

## 2017-03-05 MED ORDER — MORPHINE SULFATE (CONCENTRATE) 10 MG/0.5ML PO SOLN: 5.0000 mg | ORAL | 0 refills | Status: AC | PRN

## 2017-03-05 MED ORDER — ORAL CARE MOUTH RINSE: 15.0000 mL | Freq: Two times a day (BID) | OROMUCOSAL | 0 refills | Status: AC

## 2017-03-05 MED ORDER — ALUM & MAG HYDROXIDE-SIMETH 200-200-20 MG/5ML PO SUSP: 30.0000 mL | ORAL | 0 refills | Status: AC | PRN

## 2017-03-05 MED ORDER — PREGABALIN 75 MG PO CAPS
75.0000 mg | ORAL_CAPSULE | Freq: Every day | ORAL | Status: DC
  Administered 2017-03-05: 75 mg via ORAL
  Filled 2017-03-05: qty 1

## 2017-03-05 MED ORDER — PREGABALIN 75 MG PO CAPS: 75.0000 mg | ORAL_CAPSULE | Freq: Every day | ORAL | 0 refills | Status: AC

## 2017-03-05 MED ORDER — FUROSEMIDE 40 MG PO TABS: 40.0000 mg | ORAL_TABLET | Freq: Every day | ORAL | 11 refills | Status: AC

## 2017-03-05 NOTE — Progress Notes (Signed)
[facility] [place] [facility]. Maeola Harmanark, Marjie Chea Johnson

## 2017-03-05 NOTE — Progress Notes (Signed)
[facility] and will return at [facility] today. CSW spoke with Maralyn SagoSarah at ALF- awaiting confirmation facility prepared for pt's return (provided DC summary/FL2 in the HUB).   Pt's daughter at bedside- states she will transport pt there.   Ilean SkillMeghan Ezequiel Macauley, MSW, LCSW Clinical Social Work 03/05/2017 [phone]

## 2017-03-05 NOTE — NC FL2 (Signed)
[place] MEDICAID FL2 LEVEL OF CARE SCREENING TOOL     IDENTIFICATION  Patient Name: Pamela Peterson Birthdate: 06/14/1919 Sex: female Admission Date (Current Location): 03/01/2017  [facility] [place]   Facility and Address:  [facility],  [address], [place] [id number]      Provider Number: [id number]  Attending Physician Name and Address:  Alwyn RenMathews, Elizabeth G, MD  Relative Name and Phone Number:       Current Level of Care: Hospital Recommended Level of Care: Assisted Living Facility Prior Approval Number:    Date Approved/Denied:   PASRR Number:    Discharge Plan: Other (Comment)(ALF)    Current Diagnoses: Patient Active Problem List   Diagnosis Date Noted  . Depression 03/02/2017  . Pleural effusion 03/02/2017  . Normochromic normocytic anemia   . AKI (acute kidney injury) (HCC)   . COPD exacerbation (HCC)   . Goals of care, counseling/discussion   . Palliative care by specialist   . CHF, acute on chronic (HCC) 01/29/2017  . Acute respiratory failure with hypoxia (HCC)   . Hyponatremia 09/22/2015  . Dysphagia   . Altered mental status 03/01/2015  . Restless legs 03/01/2015  . Failure to thrive in adult 01/23/2015  . Hypothyroidism 01/23/2015  . Essential hypertension 01/23/2015  . GERD (gastroesophageal reflux disease) 01/23/2015    Orientation RESPIRATION BLADDER Height & Weight     Self, Place  O2(2L) PRN Incontinent Weight: 80 lb 7.5 oz (36.5 kg) Height:  4\' 10"  (147.3 cm)  BEHAVIORAL SYMPTOMS/MOOD NEUROLOGICAL BOWEL NUTRITION STATUS      Continent Diet(mechanical soft)  AMBULATORY STATUS COMMUNICATION OF NEEDS Skin   Limited Assist Verbally Normal                       Personal Care Assistance Level of Assistance  Bathing, Feeding, Dressing Bathing Assistance: Limited assistance Feeding assistance: Independent Dressing Assistance: Limited assistance     Functional Limitations Info  Sight, Hearing,  Speech Sight Info: Adequate Hearing Info: Adequate Speech Info: Adequate    SPECIAL CARE FACTORS FREQUENCY                       Contractures Contractures Info: Not present    Additional Factors Info  Code Status, Allergies Code Status Info: DNR Allergies Info: Other, Amoxicillin, Azithromycin, Metronidazole, Penicillins, Sulfa Antibiotics, Lorazepam           Current Medications (03/05/2017):  This is the current hospital active medication list Current Facility-Administered Medications  Medication Dose Route Frequency Provider Last Rate Last Dose  . 0.9 %  sodium chloride infusion  250 mL Intravenous PRN Opyd, Lavone Neriimothy S, MD      . acetaminophen (TYLENOL) tablet 650 mg  650 mg Oral Q4H PRN Opyd, Lavone Neriimothy S, MD   650 mg at 03/03/17 2327  . alum & mag hydroxide-simeth (MAALOX/MYLANTA) 200-200-20 MG/5ML suspension 30 mL  30 mL Oral Q2H PRN Alwyn RenMathews, Elizabeth G, MD   30 mL at 03/03/17 2046  . aspirin EC tablet 81 mg  81 mg Oral Daily Opyd, Lavone Neriimothy S, MD   81 mg at 03/05/17 1020  . busPIRone (BUSPAR) tablet 5 mg  5 mg Oral TID Opyd, Lavone Neriimothy S, MD   5 mg at 03/05/17 1020  . enoxaparin (LOVENOX) injection 30 mg  30 mg Subcutaneous Q24H Opyd, Lavone Neriimothy S, MD   30 mg at 03/05/17 1020  . furosemide (LASIX) injection 40 mg  40 mg  Intravenous Q12H Briscoe Deutscher, MD   40 mg at 03/05/17 1224  . levothyroxine (SYNTHROID, LEVOTHROID) tablet 50 mcg  50 mcg Oral QAC breakfast Opyd, Lavone Neri, MD   50 mcg at 03/05/17 0756  . lisinopril (PRINIVIL,ZESTRIL) tablet 5 mg  5 mg Oral Daily Opyd, Lavone Neri, MD   5 mg at 03/05/17 1020  . MEDLINE mouth rinse  15 mL Mouth Rinse BID Hanley Ben, Kshitiz, MD   15 mL at 03/05/17 1020  . metoprolol tartrate (LOPRESSOR) tablet 50 mg  50 mg Oral Daily Opyd, Lavone Neri, MD   50 mg at 03/05/17 1020  . mirtazapine (REMERON) tablet 7.5 mg  7.5 mg Oral QHS Opyd, Lavone Neri, MD   7.5 mg at 03/04/17 2201  . morphine CONCENTRATE 10 MG/0.5ML oral solution 5 mg  5 mg Oral Q3H PRN  Alwyn Ren, MD      . ondansetron [facility]) injection 4 mg  4 mg Intravenous Q6H PRN Opyd, Lavone Neri, MD      . pantoprazole (PROTONIX) EC tablet 40 mg  40 mg Oral Daily Opyd, Lavone Neri, MD   40 mg at 03/05/17 1020  . potassium chloride 20 MEQ/15ML (10%) solution 20 mEq  20 mEq Oral BID Opyd, Lavone Neri, MD   20 mEq at 03/05/17 1020  . pregabalin (LYRICA) capsule 75 mg  75 mg Oral Daily Alwyn Ren, MD   75 mg at 03/05/17 1020  . QUEtiapine (SEROQUEL) tablet 25 mg  25 mg Oral QHS Opyd, Lavone Neri, MD   25 mg at 03/04/17 2201  . sodium chloride flush (NS) 0.9 % injection 3 mL  3 mL Intravenous Q12H Opyd, Lavone Neri, MD   3 mL at 03/05/17 1022  . sodium chloride flush (NS) 0.9 % injection 3 mL  3 mL Intravenous PRN Opyd, Lavone Neri, MD   3 mL at 03/02/17 1215     Discharge Medications: acetaminophen 325 MG tablet Commonly known as:  TYLENOL Take 325-650 mg by mouth every 8 (eight) hours as needed for mild pain or moderate pain.   alum & mag hydroxide-simeth 200-200-20 MG/5ML suspension Commonly known as:  MAALOX/MYLANTA Take 30 mLs by mouth every 2 (two) hours as needed for indigestion.   aspirin EC 81 MG tablet Take 81 mg by mouth daily.   busPIRone 5 MG tablet Commonly known as:  BUSPAR Take 5 mg by mouth 3 (three) times daily.   esomeprazole 40 MG capsule Commonly known as:  NEXIUM Take 40 mg by mouth daily.   furosemide 40 MG tablet Commonly known as:  LASIX Take 1 tablet (40 mg total) by mouth daily.   levothyroxine 50 MCG tablet Commonly known as:  SYNTHROID, LEVOTHROID Take 50 mcg by mouth at bedtime.   lisinopril 5 MG tablet Commonly known as:  PRINIVIL,ZESTRIL Take 5 mg by mouth daily.   metoprolol tartrate 25 MG tablet Commonly known as:  LOPRESSOR Take 50 mg by mouth daily.   mirtazapine 7.5 MG tablet Commonly known as:  REMERON Take 7.5 mg by mouth at bedtime.   morphine CONCENTRATE 10 MG/0.5ML Soln concentrated solution Take 0.25  mLs (5 mg total) by mouth every 3 (three) hours as needed for severe pain.   mouth rinse Liqd solution 15 mLs by Mouth Rinse route 2 (two) times daily.   pregabalin 75 MG capsule Commonly known as:  LYRICA Take 1 capsule (75 mg total) by mouth daily. Start taking on:  03/06/2017 What changed:    medication  strength  how much to take  when to take this  Another medication with the same name was removed. Continue taking this medication, and follow the directions you see here.   QUEtiapine 25 MG tablet Commonly known as:  SEROQUEL Take 25 mg by mouth at bedtime.    Relevant Imaging Results:  Relevant Lab Results:   Additional Information SS# [id number]. Needs hospice to follow at facility  [facility], LCSW

## 2017-03-05 NOTE — Care Management Note (Signed)
Case Management Note  Patient Details  Name: Pamela Peterson MRN: [id number] Date of Birth: 10/02/1919  Subjective/Objective:   Pt admitted with CHF                 Action/Plan: Pt discharging home with Hospice.   Expected Discharge Date:  03/05/17               Expected Discharge Plan:  Home w Hospice Care  In-House Referral:     Discharge planning Services  CM Consult  Post Acute Care Choice:    Choice offered to:     DME Arranged:    DME Agency:     HH Arranged:    HH Agency:     Status of Service:  Completed, signed off  If discussed at [employer] Meetings, dates discussed:    Additional CommentsGeni Bers:  Thane Age, RN 03/05/2017, 2:56 PM

## 2017-03-05 NOTE — Discharge Summary (Signed)
Physician Discharge Summary  Pamela Peterson:[id number] DOB: 20-Oct-1919 DOA: 03/01/2017  PCP: Pamela Brazil, DO  Admit date: 03/01/2017 Discharge date: 03/05/2017  Admitted From nursing home Disposition: nursing home Recommendations for Outpatient Follow-up:  1. Follow up with PCP in 1-2 weeks 2. Please obtain BMP/CBC in one week 3. PATIENT TO BE FOLLOWED BY HOSPICE   Home Health:NONE Equipment/Devices NONE Discharge Condition HOSPICE CODE STATUS DNR Diet recommendation: MECHANICAL SOFT Brief/Interim Summary:84 y.o.femalewith medical history significant forchronic CHF, depression with anxiety, hypertension, dementia, and hypothyroidism, now presenting to the emergency department from her nursing facility for evaluation of worsening peripheral edema and dyspnea. Patient's daughter reports that when she visited the patient earlytoday, sheseemed to be in her usual state, but was then notified by nursing facility staff that the patient seemed to be in respiratory distress.Patient is unable to contribute to the history secondary to her underlying dementia.  ED Course:Upon arrival to the ED, patient is found to be afebrile, saturating low 90s on room air, and. Chest x-ray features unchanged moderate to large left, and trace right pleural effusions, as well as improved pulmonary edema since the prior study. Chemistry panel is unremarkable and CBC is notable for a improved chronic normocytic anemia with hemoglobin of 10.9. BNP is elevated to 3614 and troponin is undetectable. The patient was treated with 40 mg IV Lasix in the ED. She remains hemodynamically stable, in no acute distress, and will be observed on the telemetry unit for ongoing evaluation and management of acute on chronic CHF.  03/03/2017-patient resting in bed feels her breathing is better. She reports that she did not have a bowel movement today she is not sure if she had one yesterday. She does not have oxygen prior to  admission to the hospital.    Discharge Diagnoses:  Principal Problem:   CHF, acute on chronic [facility]) Active Problems:   Hypothyroidism   Essential hypertension   Depression   Pleural effusion  1]acute on chronic congestive heart failure patient TO BE CONTINUE ON LASIX along with beta-blocker and ACE inhibitor. Echocardiogram ejection fraction 25-30%.patient will be followed by hospice.at the facility. 2.Hypertension -BP slightly elevated in setting of acute CHF, anticipate improvement with diuresis -Continue lisinopril and metoprolol  3.Depression -Continue Remeron, Buspar, and Seroquel  4.Hypothyroidism -Continue Synthroid 5]hypokalemia replete and recheck     Discharge Instructions   Allergies as of 03/05/2017      Reactions   Other Shortness Of Breath, Other (See Comments)   CONFUSION AND PHYSICALLY SICK: Detergents, formaldehyde, any strong smells   Amoxicillin Other (See Comments)   List on MAR   Azithromycin Other (See Comments)   Listed on MAR   Metronidazole Other (See Comments)   Listed on MAR   Penicillins Hives, Swelling   Has patient had a PCN reaction causing immediate rash, facial/tongue/throat swelling, SOB or lightheadedness with hypotension: Yes Has patient had a PCN reaction causing severe rash involving mucus membranes or skin necrosis: NO Has patient had a PCN reaction that required hospitalization already in hospital  Has patient had a PCN reaction occurring within the last 10 years: no If all of the above answers are "NO", then may proceed with Cephalosporin use.   Sulfa Antibiotics Hives, Swelling   Lorazepam Other (See Comments)   Restless legs      Medication List    TAKE these medications   acetaminophen 325 MG tablet Commonly known as:  TYLENOL Take 325-650 mg by mouth every 8 (eight) hours as needed for  mild pain or moderate pain.   alum & mag hydroxide-simeth 200-200-20 MG/5ML suspension Commonly known as:   MAALOX/MYLANTA Take 30 mLs by mouth every 2 (two) hours as needed for indigestion.   aspirin EC 81 MG tablet Take 81 mg by mouth daily.   busPIRone 5 MG tablet Commonly known as:  BUSPAR Take 5 mg by mouth 3 (three) times daily.   esomeprazole 40 MG capsule Commonly known as:  NEXIUM Take 40 mg by mouth daily.   furosemide 40 MG tablet Commonly known as:  LASIX Take 1 tablet (40 mg total) by mouth daily.   levothyroxine 50 MCG tablet Commonly known as:  SYNTHROID, LEVOTHROID Take 50 mcg by mouth at bedtime.   lisinopril 5 MG tablet Commonly known as:  PRINIVIL,ZESTRIL Take 5 mg by mouth daily.   metoprolol tartrate 25 MG tablet Commonly known as:  LOPRESSOR Take 50 mg by mouth daily.   mirtazapine 7.5 MG tablet Commonly known as:  REMERON Take 7.5 mg by mouth at bedtime.   morphine CONCENTRATE 10 MG/0.5ML Soln concentrated solution Take 0.25 mLs (5 mg total) by mouth every 3 (three) hours as needed for severe pain.   mouth rinse Liqd solution 15 mLs by Mouth Rinse route 2 (two) times daily.   pregabalin 75 MG capsule Commonly known as:  LYRICA Take 1 capsule (75 mg total) by mouth daily. Start taking on:  03/06/2017 What changed:    medication strength  how much to take  when to take this  Another medication with the same name was removed. Continue taking this medication, and follow the directions you see here.   QUEtiapine 25 MG tablet Commonly known as:  SEROQUEL Take 25 mg by mouth at bedtime.      Follow-up Information    Pamela Brazil, DO Follow up.   Specialty:  Internal Medicine Contact information: [address] [place] [address] [phone]          Allergies  Allergen Reactions  . Other Shortness Of Breath and Other (See Comments)    CONFUSION AND PHYSICALLY SICK: Detergents, formaldehyde, any strong smells  . Amoxicillin Other (See Comments)    List on MAR  . Azithromycin Other (See Comments)    Listed  on MAR  . Metronidazole Other (See Comments)    Listed on MAR  . Penicillins Hives and Swelling    Has patient had a PCN reaction causing immediate rash, facial/tongue/throat swelling, SOB or lightheadedness with hypotension: Yes Has patient had a PCN reaction causing severe rash involving mucus membranes or skin necrosis: NO Has patient had a PCN reaction that required hospitalization already in hospital  Has patient had a PCN reaction occurring within the last 10 years: no If all of the above answers are "NO", then may proceed with Cephalosporin use.   . Sulfa Antibiotics Hives and Swelling  . Lorazepam Other (See Comments)    Restless legs    Consultations:none   Procedures/Studies: Dg Chest 2 View  Result Date: 03/03/2017 CLINICAL DATA:  Dyspnea.  History of CHF and hypertension. EXAM: CHEST  2 VIEW COMPARISON:  03/02/2017; 01/29/2017; 01/24/2015; Decubitus radiographs - earlier same day FINDINGS: Grossly unchanged enlarged cardiac silhouette and mediastinal contours with persistent obscuration of left heart border secondary to moderate size left-sided effusion. Atherosclerotic plaque within the thoracic aorta. There is persistent rightward deviation the tracheal air, the level of the aortic arch. The pulmonary vasculature appears slightly improved on the present examination. Improved aeration the  right lung base. Punctate granuloma overlie the right lower lung. No pneumothorax. No acute osseus abnormalities. Severe (approximately 75%) compression deformities involving the approximate T7 and T9 vertebral bodies appear similar to remote chest radiograph performed 01/24/2015. IMPRESSION: 1. Unchanged moderate size left-sided effusion with associated left basilar opacities, atelectasis versus infiltrate. 2. Suspected improving pulmonary edema and right basilar atelectasis. 3. Old approximate T7 and T9 compression fractures. Electronically Signed   By: Simonne Come M.D.   On: 03/03/2017 11:44    Dg Chest Bilateral Decubitus  Result Date: 03/03/2017 CLINICAL DATA:  Shortness of breath EXAM: CHEST - BILATERAL DECUBITUS VIEW COMPARISON:  Chest radiographs dated 03/02/2017 FINDINGS: Moderate left pleural effusion layers on the left decubitus view, although remains minimally loculated on the right decubitus view. Underlying left lower lobe opacity, likely compressive atelectasis. No pneumothorax. IMPRESSION: Moderate left pleural effusion, minimally loculated. Electronically Signed   By: Charline Bills M.D.   On: 03/03/2017 11:38   Dg Chest Port 1 View  Result Date: 03/02/2017 CLINICAL DATA:  84 year old with shortness of breath. EXAM: PORTABLE CHEST 1 VIEW COMPARISON:  Radiographs 01/29/2017 FINDINGS: Persistent moderate large left pleural effusion, unchanged. Persistent trace right pleural effusion. Persistent but improved pulmonary edema from prior exam. Unchanged heart size and mediastinal contours, left heart border obscured by pleural fluid. No pneumothorax. Probable scarring in the right mid lung. No acute osseous abnormalities are seen. IMPRESSION: 1. Unchanged moderate to large left and trace right pleural effusion. Improved pulmonary edema from prior exam. 2. Probable right midlung scarring. Electronically Signed   By: Rubye Oaks M.D.   On: 03/02/2017 00:33    (Echo, Carotid, EGD, Colonoscopy, ERCP)    Subjective:   Discharge Exam: Vitals:   03/04/17 2200 03/05/17 0407  BP: 124/65 (!) 143/55  Pulse: 62 62  Resp: 20 18  Temp: 98.3 F (36.8 C) 97.7 F (36.5 C)  SpO2: 98% 96%   Vitals:   03/04/17 1359 03/04/17 2200 03/05/17 0407 03/05/17 0500  BP: (!) 118/59 124/65 (!) 143/55   Pulse: 63 62 62   Resp: 18 20 18    Temp: 97.7 F (36.5 C) 98.3 F (36.8 C) 97.7 F (36.5 C)   TempSrc: Oral Oral Oral   SpO2: 94% 98% 96%   Weight:    36.5 kg (80 lb 7.5 oz)  Height:        General: Pt is alert, awake, not in acute distress Cardiovascular: RRR, S1/S2 +, no rubs,  no gallops Respiratory: CTA bilaterally, no wheezing, no rhonchi Abdominal: Soft, NT, ND, bowel sounds + Extremities: no edema, no cyanosis    The results of significant diagnostics from this hospitalization (including imaging, microbiology, ancillary and laboratory) are listed below for reference.     Microbiology: No results found for this or any previous visit (from the past 240 hour(s)).   Labs: BNP (last 3 results) Recent Labs    01/29/17 1325 03/02/17 0112  BNP 3,918.2* 3,614.1*   Basic Metabolic Panel: Recent Labs  Lab 03/02/17 0112 03/03/17 0430 03/04/17 0539 03/05/17 0447  NA 140 142 142 138  K 3.6 3.4* 4.6 4.5  CL 107 101 104 101  CO2 26 31 33* 29  GLUCOSE 136* 107* 112* 99  BUN 20 23* 34* 35*  CREATININE 0.83 1.07* 1.42* 1.45*  CALCIUM 8.6* 8.6* 8.4* 8.6*  MG  --  1.3*  --   --    Liver Function Tests: No results for input(s): AST, ALT, ALKPHOS, BILITOT, PROT, ALBUMIN in the last  168 hours. No results for input(s): LIPASE, AMYLASE in the last 168 hours. No results for input(s): AMMONIA in the last 168 hours. CBC: Recent Labs  Lab 03/02/17 0112 03/03/17 0430  WBC 6.2 4.5  NEUTROABS 5.3 2.8  HGB 10.9* 11.0*  HCT 33.5* 34.9*  MCV 91.5 91.4  PLT 171 177   Cardiac Enzymes: Recent Labs  Lab 03/02/17 0112  TROPONINI <0.03   BNP: Invalid input(s): POCBNP CBG: No results for input(s): GLUCAP in the last 168 hours. D-Dimer No results for input(s): DDIMER in the last 72 hours. Hgb A1c No results for input(s): HGBA1C in the last 72 hours. Lipid Profile No results for input(s): CHOL, HDL, LDLCALC, TRIG, CHOLHDL, LDLDIRECT in the last 72 hours. Thyroid function studies No results for input(s): TSH, T4TOTAL, T3FREE, THYROIDAB in the last 72 hours.  Invalid input(s): FREET3 Anemia work up No results for input(s): VITAMINB12, FOLATE, FERRITIN, TIBC, IRON, RETICCTPCT in the last 72 hours. Urinalysis    Component Value Date/Time   COLORURINE  YELLOW 01/29/2017 1549   APPEARANCEUR CLEAR 01/29/2017 1549   LABSPEC 1.013 01/29/2017 1549   PHURINE 6.0 01/29/2017 1549   GLUCOSEU NEGATIVE 01/29/2017 1549   HGBUR NEGATIVE 01/29/2017 1549   BILIRUBINUR NEGATIVE 01/29/2017 1549   KETONESUR NEGATIVE 01/29/2017 1549   PROTEINUR NEGATIVE 01/29/2017 1549   UROBILINOGEN 0.2 04/27/2014 2329   NITRITE NEGATIVE 01/29/2017 1549   LEUKOCYTESUR NEGATIVE 01/29/2017 1549   Sepsis Labs Invalid input(s): PROCALCITONIN,  WBC,  LACTICIDVEN Microbiology No results found for this or any previous visit (from the past 240 hour(s)).   Time coordinating discharge: Over 30 minutes  SIGNED:   Alwyn RenElizabeth G Tysheem Accardo, MD  [facility] 03/05/2017, 11:46 AM Pager   If 7PM-7AM, please contact night-coverage [email or web address] Password [email or web address]

## 2017-03-05 NOTE — Discharge Instructions (Signed)
Patient to be followed by hospice

## 2017-07-22 IMAGING — DX DG CHEST 2V
2 series · 2 of 2 positions shown · non-contrast
Comparison: None.

CLINICAL DATA: Dehydration, nausea

EXAM:
CHEST  2 VIEW

[chest pa]
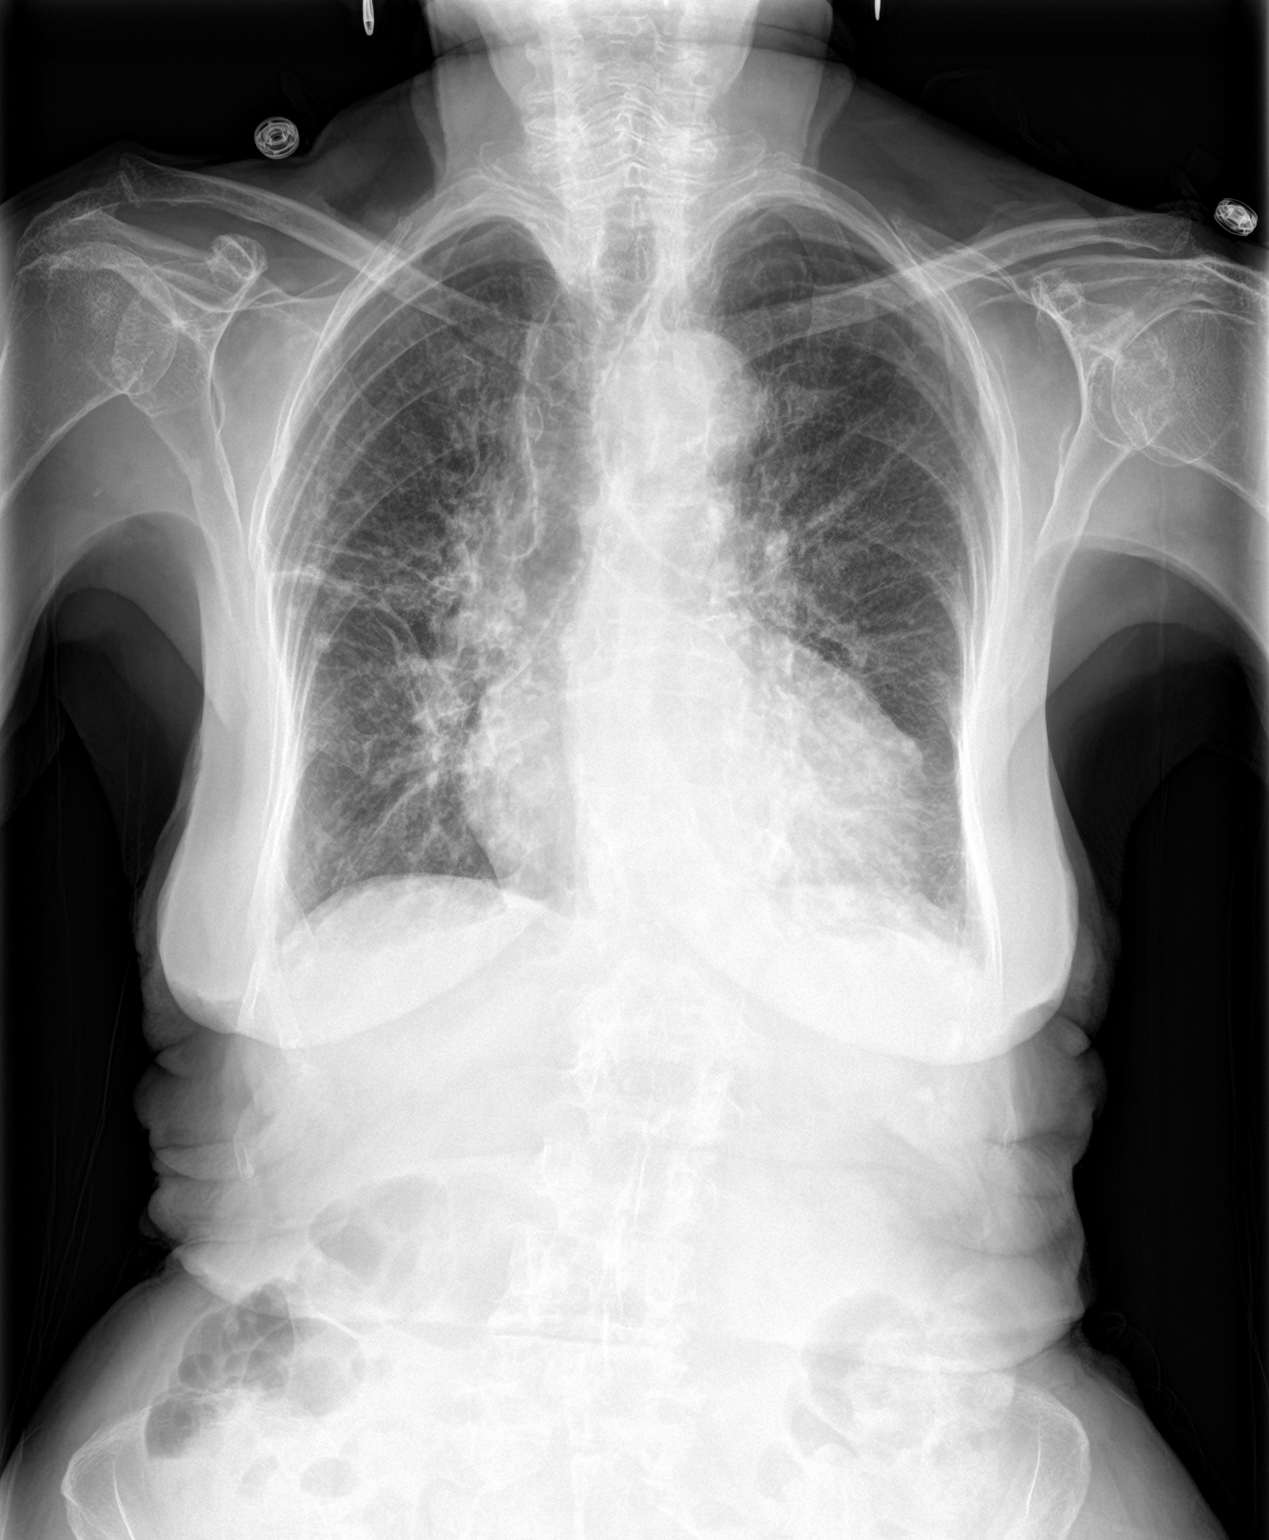

[chest lat]
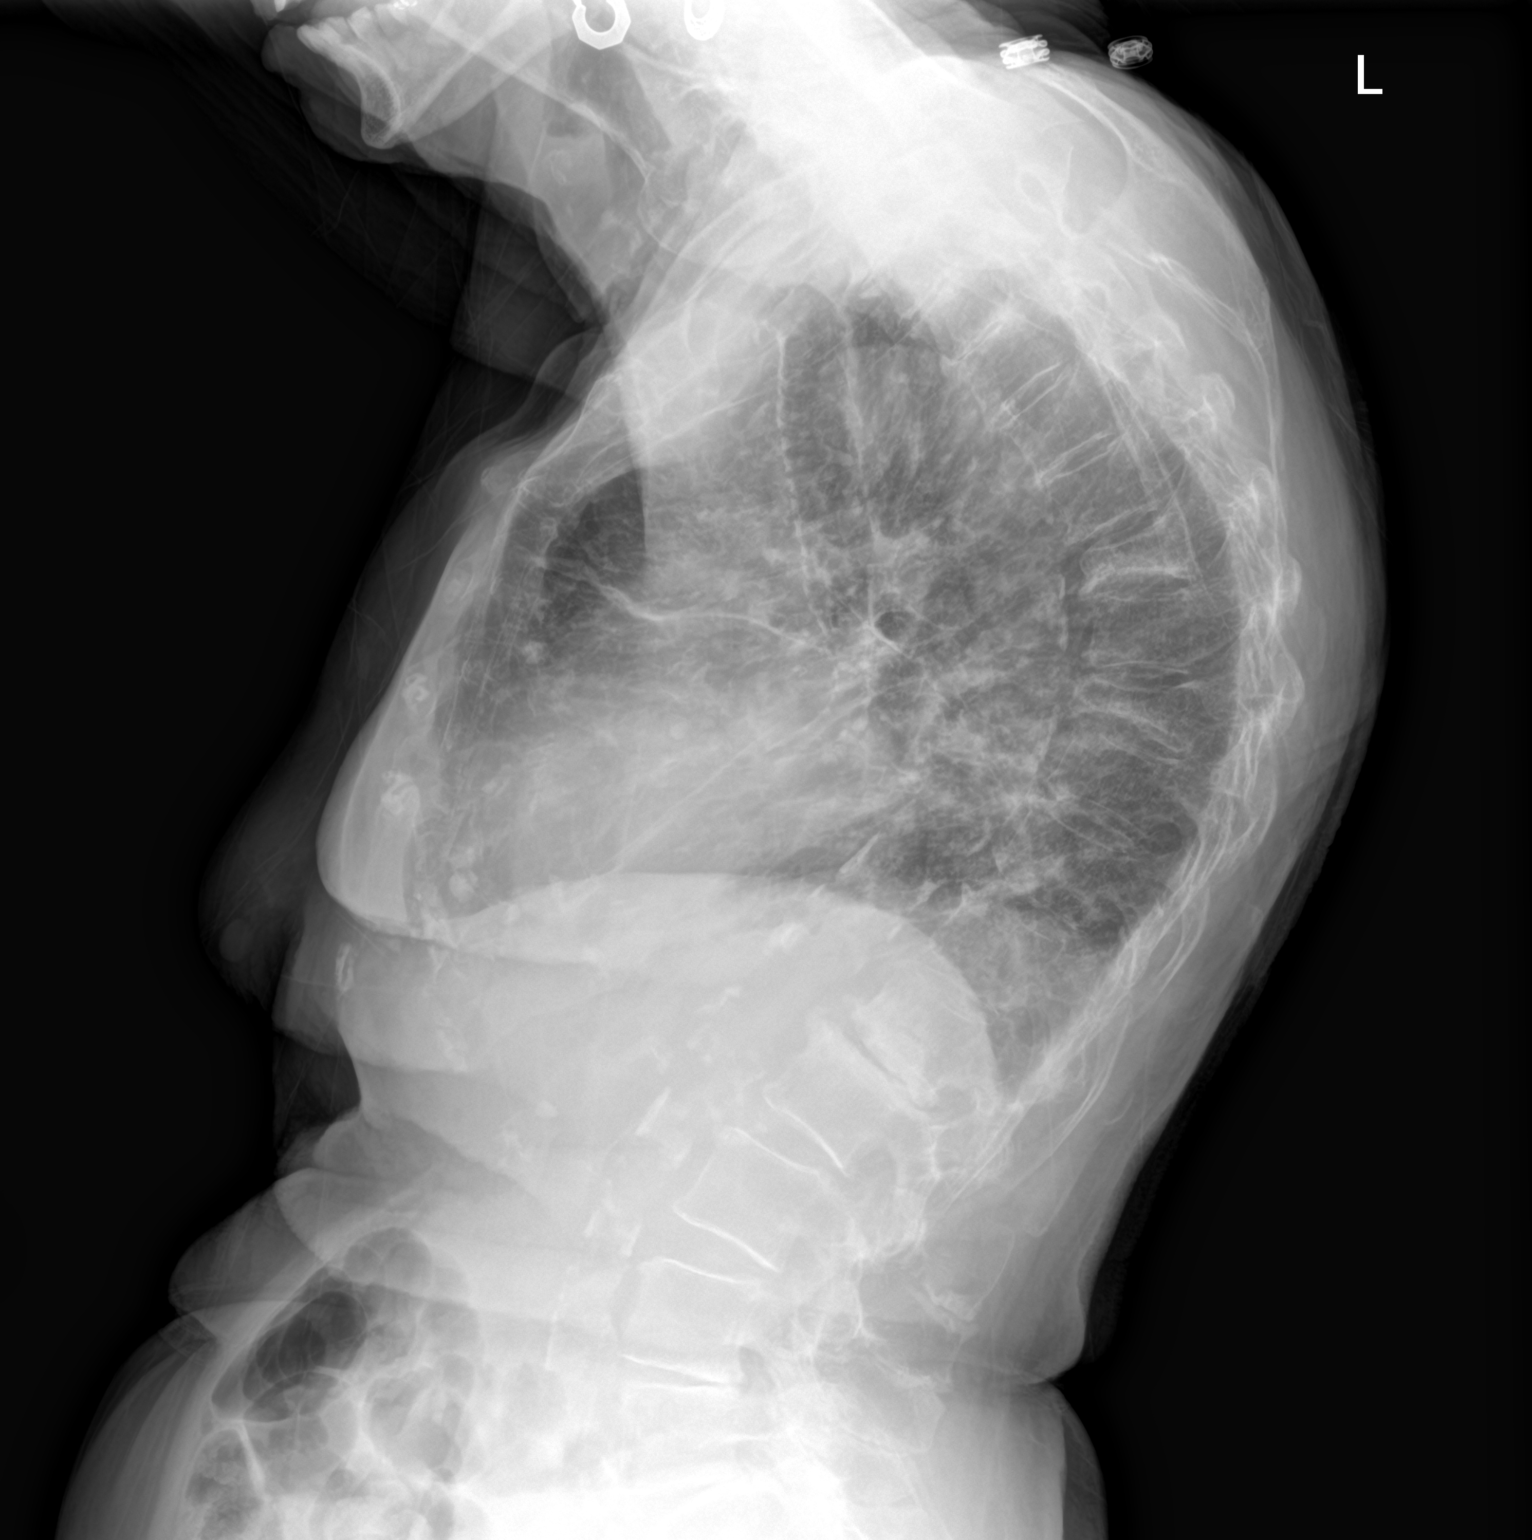

[2 of 2 positions shown; findings below may reference images not displayed]

FINDINGS: Cardiac silhouette is enlarged. Chronic bronchitic markings.
Hyperinflated lungs. Several nodules are noted along the horizontal
fissure and RIGHT middle lobe and RIGHT lower lobe. These may be
partially calcified. No comparison available. Degenerate spurring of
the spine with compression deformities and kyphosis. Atherosclerotic
calcification aorta.
IMPRESSION: 1. Hyperinflated lungs with chronic bronchitic markings.
2. Several nodular densities in the lateral RIGHT upper lobe and
RIGHT lower lobe may represent chronic granulomatous disease however
no comparison available. Consider CT of the thorax for further
evaluation.
3. Chronic compression deformities of the thoracic spine.

## 2018-12-12 ENCOUNTER — Encounter (HOSPITAL_BASED_OUTPATIENT_CLINIC_OR_DEPARTMENT_OTHER): Payer: Self-pay | Admitting: Emergency Medicine

## 2018-12-12 ENCOUNTER — Inpatient Hospital Stay (HOSPITAL_BASED_OUTPATIENT_CLINIC_OR_DEPARTMENT_OTHER)
Admission: EM | Admit: 2018-12-12 | Discharge: 2018-12-29 | DRG: 177 | Disposition: E | Attending: Internal Medicine | Admitting: Internal Medicine

## 2018-12-12 ENCOUNTER — Emergency Department (HOSPITAL_BASED_OUTPATIENT_CLINIC_OR_DEPARTMENT_OTHER)

## 2018-12-12 ENCOUNTER — Other Ambulatory Visit: Payer: Self-pay

## 2018-12-12 DIAGNOSIS — D649 Anemia, unspecified: Secondary | ICD-10-CM | POA: Diagnosis present

## 2018-12-12 DIAGNOSIS — I10 Essential (primary) hypertension: Secondary | ICD-10-CM | POA: Diagnosis present

## 2018-12-12 DIAGNOSIS — I5022 Chronic systolic (congestive) heart failure: Secondary | ICD-10-CM | POA: Diagnosis present

## 2018-12-12 DIAGNOSIS — J9621 Acute and chronic respiratory failure with hypoxia: Secondary | ICD-10-CM | POA: Diagnosis present

## 2018-12-12 DIAGNOSIS — F0390 Unspecified dementia without behavioral disturbance: Secondary | ICD-10-CM | POA: Diagnosis present

## 2018-12-12 DIAGNOSIS — E871 Hypo-osmolality and hyponatremia: Secondary | ICD-10-CM | POA: Diagnosis present

## 2018-12-12 DIAGNOSIS — N179 Acute kidney failure, unspecified: Secondary | ICD-10-CM | POA: Diagnosis not present

## 2018-12-12 DIAGNOSIS — U071 COVID-19: Secondary | ICD-10-CM | POA: Diagnosis not present

## 2018-12-12 DIAGNOSIS — J1289 Other viral pneumonia: Secondary | ICD-10-CM | POA: Diagnosis present

## 2018-12-12 DIAGNOSIS — J440 Chronic obstructive pulmonary disease with acute lower respiratory infection: Secondary | ICD-10-CM | POA: Diagnosis present

## 2018-12-12 DIAGNOSIS — R32 Unspecified urinary incontinence: Secondary | ICD-10-CM | POA: Diagnosis present

## 2018-12-12 DIAGNOSIS — N183 Chronic kidney disease, stage 3 unspecified: Secondary | ICD-10-CM | POA: Diagnosis present

## 2018-12-12 DIAGNOSIS — E785 Hyperlipidemia, unspecified: Secondary | ICD-10-CM | POA: Diagnosis present

## 2018-12-12 DIAGNOSIS — E039 Hypothyroidism, unspecified: Secondary | ICD-10-CM | POA: Diagnosis present

## 2018-12-12 DIAGNOSIS — I429 Cardiomyopathy, unspecified: Secondary | ICD-10-CM | POA: Diagnosis present

## 2018-12-12 DIAGNOSIS — Z66 Do not resuscitate: Secondary | ICD-10-CM | POA: Diagnosis present

## 2018-12-12 DIAGNOSIS — Z515 Encounter for palliative care: Secondary | ICD-10-CM | POA: Diagnosis present

## 2018-12-12 DIAGNOSIS — J439 Emphysema, unspecified: Secondary | ICD-10-CM | POA: Diagnosis present

## 2018-12-12 DIAGNOSIS — Z9981 Dependence on supplemental oxygen: Secondary | ICD-10-CM

## 2018-12-12 DIAGNOSIS — I130 Hypertensive heart and chronic kidney disease with heart failure and stage 1 through stage 4 chronic kidney disease, or unspecified chronic kidney disease: Secondary | ICD-10-CM | POA: Diagnosis present

## 2018-12-12 DIAGNOSIS — R509 Fever, unspecified: Secondary | ICD-10-CM | POA: Diagnosis present

## 2018-12-12 DIAGNOSIS — M810 Age-related osteoporosis without current pathological fracture: Secondary | ICD-10-CM | POA: Diagnosis present

## 2018-12-12 DIAGNOSIS — Z23 Encounter for immunization: Secondary | ICD-10-CM

## 2018-12-12 DIAGNOSIS — G9341 Metabolic encephalopathy: Secondary | ICD-10-CM | POA: Diagnosis present

## 2018-12-12 DIAGNOSIS — E861 Hypovolemia: Secondary | ICD-10-CM | POA: Diagnosis present

## 2018-12-12 DIAGNOSIS — K219 Gastro-esophageal reflux disease without esophagitis: Secondary | ICD-10-CM | POA: Diagnosis present

## 2018-12-12 HISTORY — DX: Disorder of kidney and ureter, unspecified: N28.9

## 2018-12-12 HISTORY — DX: Unspecified dementia, unspecified severity, without behavioral disturbance, psychotic disturbance, mood disturbance, and anxiety: F03.90

## 2018-12-12 HISTORY — DX: Gastro-esophageal reflux disease without esophagitis: K21.9

## 2018-12-12 LAB — URINALYSIS, ROUTINE W REFLEX MICROSCOPIC
Bilirubin Urine: NEGATIVE
Glucose, UA: NEGATIVE mg/dL
Ketones, ur: NEGATIVE mg/dL
Leukocytes,Ua: NEGATIVE
Nitrite: NEGATIVE
Protein, ur: NEGATIVE mg/dL
Specific Gravity, Urine: 1.015 (ref 1.005–1.030)
pH: 5.5 (ref 5.0–8.0)

## 2018-12-12 LAB — CBC WITH DIFFERENTIAL/PLATELET
Abs Immature Granulocytes: 0.03 10*3/uL (ref 0.00–0.07)
Basophils Absolute: 0 10*3/uL (ref 0.0–0.1)
Basophils Relative: 0 %
Eosinophils Absolute: 0 10*3/uL (ref 0.0–0.5)
Eosinophils Relative: 0 %
HCT: 31 % — ABNORMAL LOW (ref 36.0–46.0)
Hemoglobin: 10 g/dL — ABNORMAL LOW (ref 12.0–15.0)
Immature Granulocytes: 1 %
Lymphocytes Relative: 16 %
Lymphs Abs: 0.6 10*3/uL — ABNORMAL LOW (ref 0.7–4.0)
MCH: 30.8 pg (ref 26.0–34.0)
MCHC: 32.3 g/dL (ref 30.0–36.0)
MCV: 95.4 fL (ref 80.0–100.0)
Monocytes Absolute: 0.3 10*3/uL (ref 0.1–1.0)
Monocytes Relative: 9 %
Neutro Abs: 2.5 10*3/uL (ref 1.7–7.7)
Neutrophils Relative %: 74 %
Platelets: 177 10*3/uL (ref 150–400)
RBC: 3.25 MIL/uL — ABNORMAL LOW (ref 3.87–5.11)
RDW: 13.8 % (ref 11.5–15.5)
WBC: 3.4 10*3/uL — ABNORMAL LOW (ref 4.0–10.5)
nRBC: 0 % (ref 0.0–0.2)

## 2018-12-12 LAB — URINALYSIS, MICROSCOPIC (REFLEX)

## 2018-12-12 LAB — COMPREHENSIVE METABOLIC PANEL
ALT: 19 U/L (ref 0–44)
AST: 71 U/L — ABNORMAL HIGH (ref 15–41)
Albumin: 3.8 g/dL (ref 3.5–5.0)
Alkaline Phosphatase: 61 U/L (ref 38–126)
Anion gap: 13 (ref 5–15)
BUN: 60 mg/dL — ABNORMAL HIGH (ref 8–23)
CO2: 24 mmol/L (ref 22–32)
Calcium: 8.5 mg/dL — ABNORMAL LOW (ref 8.9–10.3)
Chloride: 101 mmol/L (ref 98–111)
Creatinine, Ser: 2.46 mg/dL — ABNORMAL HIGH (ref 0.44–1.00)
GFR calc Af Amer: 18 mL/min — ABNORMAL LOW (ref >60)
GFR calc non Af Amer: 16 mL/min — ABNORMAL LOW (ref >60)
Glucose, Bld: 136 mg/dL — ABNORMAL HIGH (ref 70–99)
Potassium: 3.6 mmol/L (ref 3.5–5.1)
Sodium: 138 mmol/L (ref 135–145)
Total Bilirubin: 0.5 mg/dL (ref 0.3–1.2)
Total Protein: 6.6 g/dL (ref 6.5–8.1)

## 2018-12-12 MED ORDER — SODIUM CHLORIDE 0.9 % IV SOLN
INTRAVENOUS | Status: DC
  Administered 2018-12-13: 02:00:00 via INTRAVENOUS

## 2018-12-12 MED ORDER — SODIUM CHLORIDE 0.9 % IV BOLUS
250.0000 mL | Freq: Once | INTRAVENOUS | Status: AC
  Administered 2018-12-12: 23:00:00 250 mL via INTRAVENOUS

## 2018-12-12 NOTE — ED Triage Notes (Addendum)
Pt arrives from [facility] with altered mental status, foul smelling urine and urinary incontinence. Unclear baseline given by EMS, pt is now alert x3 - doesn't know the current month but can report how she's feeling, name and DOB, location/situation.  Per EMS report, pt was swabbed for COVID today but results are unknown at this time. No symptoms reported, but pt is febrile in triage. COVID outbreak at facility at this time. EMS also reports low oxygen saturations - 95% on room air at this time.

## 2018-12-12 NOTE — ED Provider Notes (Signed)
[facility] EMERGENCY DEPARTMENT Provider Note   CSN: [id number] Arrival date & time: 12/22/2018  2133     History   Chief Complaint Chief Complaint  Patient presents with  . Altered Mental Status    HPI Pamela Peterson is a 85 y.o. female.     Patient sent in from [facility] nursing facility for supposedly altered mental status and foul-smelling urine and urinary incontinence.  Patient is a DNR.  Did arrive here with a temp of 100.6.  Normally on 2 L of oxygen.  On that amount of oxygen she is satting 100%.  They apparently are having a COVID outbreak at the facility patient without any respiratory symptoms.  Blood pressure is good at 141/70.  Respiratory rate 14 and her heart rate is 71.  Patient very hard of hearing but quite alert and able to converse and follow commands.  Patient denies any symptoms.     Past Medical History:  Diagnosis Date  . Anxiety   . CHF (congestive heart failure) (HCC)   . Dementia (HCC)   . GERD (gastroesophageal reflux disease)   . Hyperlipidemia   . Hypertension   . Hypothyroid   . Osteoporosis   . Renal disorder    kidney failure    Patient Active Problem List   Diagnosis Date Noted  . ARF (acute renal failure) (HCC) 12/14/2018  . Depression 03/02/2017  . Pleural effusion 03/02/2017  . Normochromic normocytic anemia   . AKI (acute kidney injury) (HCC)   . COPD exacerbation (HCC)   . Goals of care, counseling/discussion   . Palliative care by specialist   . CHF, acute on chronic (HCC) 01/29/2017  . Acute respiratory failure with hypoxia (HCC)   . Hyponatremia 09/22/2015  . Dysphagia   . Altered mental status 03/01/2015  . Restless legs 03/01/2015  . Failure to thrive in adult 01/23/2015  . Hypothyroidism 01/23/2015  . Essential hypertension 01/23/2015  . GERD (gastroesophageal reflux disease) 01/23/2015    Past Surgical History:  Procedure Laterality Date  . BLADDER SURGERY       OB History   No obstetric history  on file.      Home Medications    Prior to Admission medications   Medication Sig Start Date End Date Taking? Authorizing Provider  busPIRone (BUSPAR) 5 MG tablet Take 5 mg by mouth 3 (three) times daily.   Yes [provider]  cholecalciferol (VITAMIN D3) 10 MCG (400 UNIT) TABS tablet Take 125 Units by mouth daily.   Yes [provider]  furosemide (LASIX) 40 MG tablet Take 1 tablet (40 mg total) by mouth daily. Patient taking differently: Take 80 mg by mouth daily.  03/05/17 12/14/2018 Yes Alwyn Ren, MD  HYDROmorphone (DILAUDID) 2 MG tablet Take 0.5 mg by mouth every 2 (two) hours as needed for severe pain.   Yes [provider]  levothyroxine (SYNTHROID, LEVOTHROID) 50 MCG tablet Take 50 mcg by mouth at bedtime.    Yes [provider]  lisinopril (PRINIVIL,ZESTRIL) 5 MG tablet Take 5 mg by mouth daily.   Yes [provider]  loperamide (IMODIUM) 2 MG capsule Take by mouth as needed for diarrhea or loose stools.   Yes [provider]  metoprolol tartrate (LOPRESSOR) 25 MG tablet Take 50 mg by mouth daily.   Yes [provider]  mirtazapine (REMERON) 7.5 MG tablet Take 7.5 mg by mouth at bedtime.   Yes [provider]  potassium chloride (KLOR-CON) 20 MEQ packet  Take 20 mEq by mouth 2 (two) times daily.   Yes [provider]  pregabalin (LYRICA) 75 MG capsule Take 1 capsule (75 mg total) by mouth daily. 03/06/17  Yes Alwyn RenMathews, Elizabeth G, MD  psyllium (REGULOID) 0.52 g capsule Take 0.52 g by mouth daily.   Yes [provider]  QUEtiapine (SEROQUEL) 25 MG tablet Take 25 mg by mouth at bedtime.   Yes [provider]  acetaminophen (TYLENOL) 325 MG tablet Take 325-650 mg by mouth every 8 (eight) hours as needed for mild pain or moderate pain.    [provider]  alum & mag hydroxide-simeth (MAALOX/MYLANTA) 200-200-20 MG/5ML suspension Take 30 mLs by mouth every 2 (two) hours as  needed for indigestion. 03/05/17   Alwyn RenMathews, Elizabeth G, MD  aspirin EC 81 MG tablet Take 81 mg by mouth daily.    [provider]  esomeprazole (NEXIUM) 40 MG capsule Take 40 mg by mouth daily.     [provider]  Morphine Sulfate (MORPHINE CONCENTRATE) 10 MG/0.5ML SOLN concentrated solution Take 0.25 mLs (5 mg total) by mouth every 3 (three) hours as needed for severe pain. 03/05/17   Alwyn RenMathews, Elizabeth G, MD  mouth rinse LIQD solution 15 mLs by Mouth Rinse route 2 (two) times daily. 03/05/17   Alwyn RenMathews, Elizabeth G, MD    Family History Family History  Problem Relation Age of Onset  . Heart attack Brother   . COPD Sister   . Cancer Brother   . Tuberculosis Mother   . Heart attack Father   . Alcohol abuse Father     Social History Social History   Tobacco Use  . Smoking status: Never Smoker  . Smokeless tobacco: Never Used  Substance Use Topics  . Alcohol use: No  . Drug use: No     Allergies   Other, Amoxicillin, Azithromycin, Metronidazole, Penicillins, Sulfa antibiotics, and Lorazepam   Review of Systems Review of Systems  Constitutional: Positive for fever. Negative for chills.  HENT: Negative for congestion, rhinorrhea and sore throat.   Eyes: Negative for visual disturbance.  Respiratory: Negative for cough and shortness of breath.   Cardiovascular: Negative for chest pain and leg swelling.  Gastrointestinal: Negative for abdominal pain, diarrhea, nausea and vomiting.  Genitourinary: Positive for dysuria and frequency. Negative for hematuria.  Musculoskeletal: Negative for back pain and neck pain.  Skin: Negative for rash.  Neurological: Negative for dizziness, light-headedness and headaches.  Hematological: Does not bruise/bleed easily.  Psychiatric/Behavioral: Negative for confusion.     Physical Exam Updated Vital Signs BP (!) 141/70 (BP Location: Right Arm)   Pulse 71   Temp (!) 100.6 F (38.1 C) (Oral)   Resp 14   Wt 43.5 kg   SpO2  100%   BMI 20.06 kg/m   Physical Exam Vitals signs and nursing note reviewed.  Constitutional:      General: She is not in acute distress.    Appearance: Normal appearance. She is well-developed.  HENT:     Head: Normocephalic and atraumatic.  Eyes:     Extraocular Movements: Extraocular movements intact.     Conjunctiva/sclera: Conjunctivae normal.     Pupils: Pupils are equal, round, and reactive to light.  Neck:     Musculoskeletal: Normal range of motion and neck supple.  Cardiovascular:     Rate and Rhythm: Normal rate and regular rhythm.     Heart sounds: No murmur.  Pulmonary:     Effort: Pulmonary effort is normal. No respiratory  distress.     Breath sounds: Normal breath sounds.  Abdominal:     Palpations: Abdomen is soft.     Tenderness: There is no abdominal tenderness.  Musculoskeletal: Normal range of motion.        General: No swelling.     Right lower leg: No edema.     Left lower leg: No edema.  Skin:    General: Skin is warm and dry.  Neurological:     Mental Status: She is alert and oriented to person, place, and time.     Cranial Nerves: No cranial nerve deficit.     Motor: No weakness.     Comments: And with movement of all extremities.  Bilateral weakness to the legs.  But good movement.  Good strength in upper extremities.  No cranial nerve deficits.      ED Treatments / Results  Labs (all labs ordered are listed, but only abnormal results are displayed) Labs Reviewed  URINALYSIS, ROUTINE W REFLEX MICROSCOPIC - Abnormal; Notable for the following components:      Result Value   Hgb urine dipstick SMALL (*)    All other components within normal limits  CBC WITH DIFFERENTIAL/PLATELET - Abnormal; Notable for the following components:   WBC 3.4 (*)    RBC 3.25 (*)    Hemoglobin 10.0 (*)    HCT 31.0 (*)    Lymphs Abs 0.6 (*)    All other components within normal limits  COMPREHENSIVE METABOLIC PANEL - Abnormal; Notable for the following  components:   Glucose, Bld 136 (*)    BUN 60 (*)    Creatinine, Ser 2.46 (*)    Calcium 8.5 (*)    AST 71 (*)    GFR calc non Af Amer 16 (*)    GFR calc Af Amer 18 (*)    All other components within normal limits  URINALYSIS, MICROSCOPIC (REFLEX) - Abnormal; Notable for the following components:   Bacteria, UA FEW (*)    All other components within normal limits  URINE CULTURE  SARS CORONAVIRUS 2 (TAT 6-24 HRS)    EKG EKG Interpretation  Date/Time:  Thursday January 09, 2019 22:44:11 EDT Ventricular Rate:  66 PR Interval:    QRS Duration: 152 QT Interval:  498 QTC Calculation: 522 R Axis:   -19 Text Interpretation:  Sinus rhythm Left bundle branch block Confirmed by Vanetta Mulders [phone]) on 01/09/19 11:37:54 PM   Radiology Dg Chest Port 1 View  Result Date: January 09, 2019 CLINICAL DATA:  85 year old female with fever and altered mental status. EXAM: PORTABLE CHEST 1 VIEW COMPARISON:  Chest radiographs 03/03/2017 and earlier. FINDINGS: Portable AP upright view at 2209 hours. Substantially regressed but not fully resolved left pleural effusion since 2019, small residual. Mildly lower lung volumes. No pneumothorax, pulmonary edema or new pulmonary opacity. Stable cardiac size and mediastinal contours. Small calcified granulomas or chest wall calcifications on the right are unchanged. Negative visible bowel gas pattern. No acute osseous abnormality identified. IMPRESSION: 1. Substantially regressed but not fully resolved left pleural effusion since 2019. 2. Negative lungs elsewhere.  No new cardiopulmonary abnormality. Electronically Signed   By: Odessa Fleming M.D.   On: 01-09-2019 22:43    Procedures Procedures (including critical care time)  Medications Ordered in ED Medications  0.9 %  sodium chloride infusion (has no administration in time range)  sodium chloride 0.9 % bolus 250 mL (250 mLs Intravenous New Bag/Given 01-09-19 2252)     Initial Impression / Assessment and  Plan /  ED Course  I have reviewed the triage vital signs and the nursing notes.  Pertinent labs & imaging results that were available during my care of the patient were reviewed by me and considered in my medical decision making (see chart for details).        Patient is a DNR.  Urinalysis not consistent with urinary tract infection at all.  Patient without any significant altered mental status here.  Chest x-ray negative.  No leukocytosis.  However BUN and creatinine is consistent with acute kidney injury.  Probably prerenal.  But urinalysis spec gravity is good but she had received some IV fluids here.  And now receiving them gently.  Discussed with hospitalist they will admit for the acute kidney injury.  COVID testing ordered.  COVID infection is a possibility do the nursing home environment that she is coming from.  But no clinical symptoms suggestive of it.  Again no altered mental status no evidence of any significant focal neuro deficit.  Final Clinical Impressions(s) / ED Diagnoses   Final diagnoses:  AKI (acute kidney injury) [facility])    ED Discharge Orders    None       Fredia Sorrow, MD 12/13/18 0002

## 2018-12-12 NOTE — ED Notes (Signed)
Per facility paperwork, pt to wear 2L [place] oxygen at all times. Placed on 2L [place] at this time.

## 2018-12-13 DIAGNOSIS — R509 Fever, unspecified: Secondary | ICD-10-CM | POA: Diagnosis not present

## 2018-12-13 DIAGNOSIS — I5022 Chronic systolic (congestive) heart failure: Secondary | ICD-10-CM | POA: Diagnosis not present

## 2018-12-13 DIAGNOSIS — K219 Gastro-esophageal reflux disease without esophagitis: Secondary | ICD-10-CM | POA: Diagnosis not present

## 2018-12-13 DIAGNOSIS — I429 Cardiomyopathy, unspecified: Secondary | ICD-10-CM | POA: Diagnosis not present

## 2018-12-13 DIAGNOSIS — J1289 Other viral pneumonia: Secondary | ICD-10-CM | POA: Diagnosis not present

## 2018-12-13 DIAGNOSIS — R32 Unspecified urinary incontinence: Secondary | ICD-10-CM | POA: Diagnosis not present

## 2018-12-13 DIAGNOSIS — U071 COVID-19: Principal | ICD-10-CM

## 2018-12-13 DIAGNOSIS — I130 Hypertensive heart and chronic kidney disease with heart failure and stage 1 through stage 4 chronic kidney disease, or unspecified chronic kidney disease: Secondary | ICD-10-CM | POA: Diagnosis not present

## 2018-12-13 DIAGNOSIS — E861 Hypovolemia: Secondary | ICD-10-CM | POA: Diagnosis not present

## 2018-12-13 DIAGNOSIS — E039 Hypothyroidism, unspecified: Secondary | ICD-10-CM | POA: Diagnosis not present

## 2018-12-13 DIAGNOSIS — N183 Chronic kidney disease, stage 3 unspecified: Secondary | ICD-10-CM | POA: Diagnosis not present

## 2018-12-13 DIAGNOSIS — F0390 Unspecified dementia without behavioral disturbance: Secondary | ICD-10-CM | POA: Diagnosis not present

## 2018-12-13 DIAGNOSIS — G9341 Metabolic encephalopathy: Secondary | ICD-10-CM | POA: Diagnosis not present

## 2018-12-13 DIAGNOSIS — N179 Acute kidney failure, unspecified: Secondary | ICD-10-CM | POA: Diagnosis not present

## 2018-12-13 DIAGNOSIS — D649 Anemia, unspecified: Secondary | ICD-10-CM | POA: Diagnosis not present

## 2018-12-13 DIAGNOSIS — E871 Hypo-osmolality and hyponatremia: Secondary | ICD-10-CM | POA: Diagnosis not present

## 2018-12-13 DIAGNOSIS — J439 Emphysema, unspecified: Secondary | ICD-10-CM | POA: Diagnosis present

## 2018-12-13 DIAGNOSIS — M810 Age-related osteoporosis without current pathological fracture: Secondary | ICD-10-CM | POA: Diagnosis not present

## 2018-12-13 DIAGNOSIS — Z9981 Dependence on supplemental oxygen: Secondary | ICD-10-CM | POA: Diagnosis not present

## 2018-12-13 DIAGNOSIS — Z66 Do not resuscitate: Secondary | ICD-10-CM | POA: Diagnosis not present

## 2018-12-13 DIAGNOSIS — J440 Chronic obstructive pulmonary disease with acute lower respiratory infection: Secondary | ICD-10-CM | POA: Diagnosis not present

## 2018-12-13 DIAGNOSIS — Z515 Encounter for palliative care: Secondary | ICD-10-CM | POA: Diagnosis not present

## 2018-12-13 DIAGNOSIS — E785 Hyperlipidemia, unspecified: Secondary | ICD-10-CM | POA: Diagnosis not present

## 2018-12-13 DIAGNOSIS — J9621 Acute and chronic respiratory failure with hypoxia: Secondary | ICD-10-CM | POA: Diagnosis not present

## 2018-12-13 DIAGNOSIS — Z23 Encounter for immunization: Secondary | ICD-10-CM | POA: Diagnosis not present

## 2018-12-13 LAB — SARS CORONAVIRUS 2 (TAT 6-24 HRS): SARS Coronavirus 2: POSITIVE — AB

## 2018-12-13 LAB — CBC
HCT: 27.6 % — ABNORMAL LOW (ref 36.0–46.0)
Hemoglobin: 8.7 g/dL — ABNORMAL LOW (ref 12.0–15.0)
MCH: 30.5 pg (ref 26.0–34.0)
MCHC: 31.5 g/dL (ref 30.0–36.0)
MCV: 96.8 fL (ref 80.0–100.0)
Platelets: 154 10*3/uL (ref 150–400)
RBC: 2.85 MIL/uL — ABNORMAL LOW (ref 3.87–5.11)
RDW: 13.9 % (ref 11.5–15.5)
WBC: 3.6 10*3/uL — ABNORMAL LOW (ref 4.0–10.5)
nRBC: 0 % (ref 0.0–0.2)

## 2018-12-13 LAB — BASIC METABOLIC PANEL
Anion gap: 11 (ref 5–15)
BUN: 52 mg/dL — ABNORMAL HIGH (ref 8–23)
CO2: 24 mmol/L (ref 22–32)
Calcium: 8 mg/dL — ABNORMAL LOW (ref 8.9–10.3)
Chloride: 105 mmol/L (ref 98–111)
Creatinine, Ser: 1.94 mg/dL — ABNORMAL HIGH (ref 0.44–1.00)
GFR calc Af Amer: 25 mL/min — ABNORMAL LOW (ref >60)
GFR calc non Af Amer: 21 mL/min — ABNORMAL LOW (ref >60)
Glucose, Bld: 119 mg/dL — ABNORMAL HIGH (ref 70–99)
Potassium: 3.5 mmol/L (ref 3.5–5.1)
Sodium: 140 mmol/L (ref 135–145)

## 2018-12-13 MED ORDER — POTASSIUM CHLORIDE 20 MEQ PO PACK
20.0000 meq | PACK | Freq: Two times a day (BID) | ORAL | Status: DC
  Administered 2018-12-13 – 2018-12-15 (×4): 20 meq via ORAL
  Filled 2018-12-13 (×8): qty 1

## 2018-12-13 MED ORDER — INFLUENZA VAC A&B SA ADJ QUAD 0.5 ML IM PRSY
0.5000 mL | PREFILLED_SYRINGE | INTRAMUSCULAR | Status: DC
  Filled 2018-12-13: qty 0.5

## 2018-12-13 MED ORDER — METOPROLOL TARTRATE 50 MG PO TABS
25.0000 mg | ORAL_TABLET | Freq: Once | ORAL | Status: AC
  Administered 2018-12-13: 25 mg via ORAL
  Filled 2018-12-13: qty 1

## 2018-12-13 MED ORDER — ACETAMINOPHEN 325 MG PO TABS: 325.0000 mg | ORAL_TABLET | Freq: Three times a day (TID) | ORAL | Status: DC | PRN

## 2018-12-13 MED ORDER — PSYLLIUM 95 % PO PACK
1.0000 | PACK | Freq: Every day | ORAL | Status: DC
  Administered 2018-12-14: 1 via ORAL
  Filled 2018-12-13 (×6): qty 1

## 2018-12-13 MED ORDER — LEVOTHYROXINE SODIUM 50 MCG PO TABS
25.0000 ug | ORAL_TABLET | Freq: Every day | ORAL | Status: DC
  Administered 2018-12-14 – 2018-12-16 (×3): 25 ug via ORAL
  Filled 2018-12-13 (×3): qty 1

## 2018-12-13 MED ORDER — ACETAMINOPHEN 325 MG PO TABS
650.0000 mg | ORAL_TABLET | Freq: Four times a day (QID) | ORAL | Status: DC | PRN
  Administered 2018-12-13: 650 mg via ORAL
  Filled 2018-12-13 (×3): qty 2

## 2018-12-13 MED ORDER — ENSURE ENLIVE PO LIQD
237.0000 mL | Freq: Two times a day (BID) | ORAL | Status: DC
  Administered 2018-12-14 – 2018-12-15 (×4): 237 mL via ORAL

## 2018-12-13 MED ORDER — VITAMIN D3 25 MCG (1000 UNIT) PO TABS
125.0000 ug | ORAL_TABLET | Freq: Every day | ORAL | Status: DC
  Administered 2018-12-14 – 2018-12-15 (×2): 5000 [IU] via ORAL
  Filled 2018-12-13 (×4): qty 5

## 2018-12-13 MED ORDER — PREGABALIN 50 MG PO CAPS
75.0000 mg | ORAL_CAPSULE | Freq: Every day | ORAL | Status: DC
  Administered 2018-12-14 – 2018-12-15 (×2): 75 mg via ORAL
  Filled 2018-12-13 (×2): qty 1

## 2018-12-13 MED ORDER — SODIUM CHLORIDE 0.9 % IV SOLN
INTRAVENOUS | Status: DC
  Administered 2018-12-14: 05:00:00 via INTRAVENOUS

## 2018-12-13 MED ORDER — BUSPIRONE HCL 5 MG PO TABS: 5.0000 mg | ORAL_TABLET | Freq: Three times a day (TID) | ORAL | Status: DC

## 2018-12-13 MED ORDER — PANTOPRAZOLE SODIUM 40 MG PO TBEC: 40.0000 mg | DELAYED_RELEASE_TABLET | Freq: Every day | ORAL | Status: DC

## 2018-12-13 MED ORDER — METOPROLOL TARTRATE 25 MG PO TABS
25.0000 mg | ORAL_TABLET | Freq: Every day | ORAL | Status: DC
  Administered 2018-12-14 – 2018-12-15 (×2): 25 mg via ORAL
  Filled 2018-12-13 (×2): qty 1

## 2018-12-13 MED ORDER — ASPIRIN EC 81 MG PO TBEC: 81.0000 mg | DELAYED_RELEASE_TABLET | Freq: Every day | ORAL | Status: DC

## 2018-12-13 MED ORDER — ACETAMINOPHEN 650 MG RE SUPP
650.0000 mg | Freq: Four times a day (QID) | RECTAL | Status: DC | PRN
  Administered 2018-12-16: 650 mg via RECTAL
  Filled 2018-12-13: qty 1

## 2018-12-13 MED ORDER — ENOXAPARIN SODIUM 30 MG/0.3ML ~~LOC~~ SOLN
30.0000 mg | SUBCUTANEOUS | Status: DC
  Administered 2018-12-13: 30 mg via SUBCUTANEOUS
  Filled 2018-12-13: qty 0.3

## 2018-12-13 MED ORDER — MIRTAZAPINE 15 MG PO TABS: 7.5000 mg | ORAL_TABLET | Freq: Every day | ORAL | Status: DC

## 2018-12-13 MED ORDER — MORPHINE SULFATE (CONCENTRATE) 10 MG/0.5ML PO SOLN
5.0000 mg | ORAL | Status: DC | PRN
  Administered 2018-12-14 – 2018-12-16 (×10): 5 mg via ORAL
  Filled 2018-12-13 (×10): qty 0.5

## 2018-12-13 MED ORDER — ORAL CARE MOUTH RINSE
15.0000 mL | Freq: Two times a day (BID) | OROMUCOSAL | Status: DC
  Administered 2018-12-14 – 2018-12-18 (×10): 15 mL via OROMUCOSAL

## 2018-12-13 MED ORDER — LOPERAMIDE HCL 2 MG PO CAPS
2.0000 mg | ORAL_CAPSULE | ORAL | Status: DC | PRN
  Filled 2018-12-13: qty 1

## 2018-12-13 MED ORDER — QUETIAPINE FUMARATE 25 MG PO TABS: 25.0000 mg | ORAL_TABLET | Freq: Every day | ORAL | Status: DC

## 2018-12-13 MED ORDER — ORAL CARE MOUTH RINSE: 15.0000 mL | Freq: Two times a day (BID) | OROMUCOSAL | Status: DC

## 2018-12-13 MED ORDER — ALUM & MAG HYDROXIDE-SIMETH 200-200-20 MG/5ML PO SUSP: 30.0000 mL | ORAL | Status: DC | PRN

## 2018-12-13 MED ORDER — HYDRALAZINE HCL 10 MG PO TABS
10.0000 mg | ORAL_TABLET | Freq: Three times a day (TID) | ORAL | Status: DC | PRN
  Administered 2018-12-13: 10 mg via ORAL
  Filled 2018-12-13 (×5): qty 1

## 2018-12-13 NOTE — ED Notes (Signed)
Pt eating cereal and drinking coffee and orange juice.

## 2018-12-13 NOTE — Progress Notes (Addendum)
RN from [facility] called to state that patient's COVID test from [facility] was positive.  [place] test positive as well.  Dr. Clementeen Graham is aware.

## 2018-12-13 NOTE — Progress Notes (Signed)
Admission history completed with assistance of staff at [facility] and patient's daughter.

## 2018-12-13 NOTE — H&P (Addendum)
TRH H&P   Patient Demographics:    Pamela Peterson, is a 85 y.o. female  MRN: [id number]   DOB - 1920/01/07  Admit Date - 12/27/2018  Outpatient Primary MD for the patient is Nicola Girt DO  Referring MD: Dr. Bobby Rumpf  Outpatient Specialists: None  Patient coming from: Assisted living  Chief Complaint  Patient presents with  . Altered Mental Status      HPI:    Pamela Peterson  is a 85 y.o. female, with history of hypertension, severe cardiomyopathy with EF of 25-30% as per prior echo, depression and anxiety, moderate to severe dementia, hypothyroidism, COPD on chronic home O2 (2 L) who was sent to [facility] [place] from [place] assisted living with change in her mental status, urinary incontinence and foul-smelling urine.  Patient confused (baseline unknown) and unable to provide any history.  No history of nausea, vomiting, chest pain, shortness of breath, abdominal pain diarrhea.  Reportedly there was a COVID outbreak at the facility but patient did not have any respiratory symptoms or unclear if she was in close proximity to infected residence. In the ED she was found to be hypertensive, fever of 100.6 F.  She was maintaining sats in the mid 90s on 2 L via nasal cannula. UA was negative for infection.  Blood work showed WBC of 3.4, hemoglobin of 10.  Chemistry showed normal sodium, potassium was 3.6, glucose of 136, BUN of 60 and creatinine of 2.46.  Chest x-ray showed mild left pleural effusion (much improved from prior chest x-ray in 2019) Patient given IV fluids, tested for COVID-19 and hospitalist consulted for observation to [facility]. After arrival to the [facility] several hours later her test result came back as positive for COVID-19.  The facility notified the nurse that she was tested there yesterday as well and it came back positive.  spoke  with daughter on the phone , at baseline she has moderate dementia with.'s of lucidity followed by confusion.  She is capable of most of her ADLs and ambulates quite well with a walker.  Daughter has noted that for the past 2 days patient has been more confused from baseline.    Review of systems:    Review of systems limited due to patient's dementia and confusion.  As outlined in HPI.   With Past History of the following :    Past Medical History:  Diagnosis Date  . Anxiety   . CHF (congestive heart failure) ([place])   . Dementia ([place])   . GERD (gastroesophageal reflux disease)   . Hyperlipidemia   . Hypertension   . Hypothyroid   . Osteoporosis   . Renal disorder    kidney failure      Past Surgical History:  Procedure Laterality Date  . BLADDER SURGERY        Social History:  Social History   Tobacco Use  . Smoking status: Never Smoker  . Smokeless tobacco: Never Used  Substance Use Topics  . Alcohol use: No     Lives -at facility  Mobility -unclear (ambulate with support)     Family History :     Family History  Problem Relation Age of Onset  . Heart attack Brother   . COPD Sister   . Cancer Brother   . Tuberculosis Mother   . Heart attack Father   . Alcohol abuse Father       Home Medications:   Prior to Admission medications   Medication Sig Start Date End Date Taking? Authorizing Provider  busPIRone (BUSPAR) 5 MG tablet Take 5 mg by mouth 3 (three) times daily.   Yes [provider]  cholecalciferol (VITAMIN D3) 10 MCG (400 UNIT) TABS tablet Take 125 Units by mouth daily.   Yes [provider]  furosemide (LASIX) 40 MG tablet Take 1 tablet (40 mg total) by mouth daily. Patient taking differently: Take 80 mg by mouth daily.  03/05/17 December 17, 2018 Yes Alwyn Ren, MD  HYDROmorphone (DILAUDID) 2 MG tablet Take 0.5 mg by mouth every 2 (two) hours as needed for severe pain.   Yes [provider]  levothyroxine  (SYNTHROID, LEVOTHROID) 50 MCG tablet Take 50 mcg by mouth at bedtime.    Yes [provider]  lisinopril (PRINIVIL,ZESTRIL) 5 MG tablet Take 5 mg by mouth daily.   Yes [provider]  loperamide (IMODIUM) 2 MG capsule Take by mouth as needed for diarrhea or loose stools.   Yes [provider]  metoprolol tartrate (LOPRESSOR) 25 MG tablet Take 50 mg by mouth daily.   Yes [provider]  mirtazapine (REMERON) 7.5 MG tablet Take 7.5 mg by mouth at bedtime.   Yes [provider]  potassium chloride (KLOR-CON) 20 MEQ packet Take 20 mEq by mouth 2 (two) times daily.   Yes [provider]  pregabalin (LYRICA) 75 MG capsule Take 1 capsule (75 mg total) by mouth daily. 03/06/17  Yes Alwyn Ren, MD  psyllium (REGULOID) 0.52 g capsule Take 0.52 g by mouth daily.   Yes [provider]  QUEtiapine (SEROQUEL) 25 MG tablet Take 25 mg by mouth at bedtime.   Yes [provider]  acetaminophen (TYLENOL) 325 MG tablet Take 325-650 mg by mouth every 8 (eight) hours as needed for mild pain or moderate pain.    [provider]  alum & mag hydroxide-simeth (MAALOX/MYLANTA) 200-200-20 MG/5ML suspension Take 30 mLs by mouth every 2 (two) hours as needed for indigestion. 03/05/17   Alwyn Ren, MD  aspirin EC 81 MG tablet Take 81 mg by mouth daily.    [provider]  esomeprazole (NEXIUM) 40 MG capsule Take 40 mg by mouth daily.     [provider]  Morphine Sulfate (MORPHINE CONCENTRATE) 10 MG/0.5ML SOLN concentrated solution Take 0.25 mLs (5 mg total) by mouth every 3 (three) hours as needed for severe pain. 03/05/17   Alwyn Ren, MD  mouth rinse LIQD solution 15 mLs by Mouth Rinse route 2 (two) times daily. 03/05/17   Alwyn Ren, MD     Allergies:     Allergies  Allergen Reactions  . Other Shortness Of Breath and Other (See Comments)    CONFUSION AND PHYSICALLY SICK: Detergents,  formaldehyde, any strong smells  . Amoxicillin Other (See Comments)    List on MAR  . Azithromycin  Other (See Comments)    Listed on MAR  . Metronidazole Other (See Comments)    Listed on MAR  . Penicillins Hives and Swelling    Has patient had a PCN reaction causing immediate rash, facial/tongue/throat swelling, SOB or lightheadedness with hypotension: Yes Has patient had a PCN reaction causing severe rash involving mucus membranes or skin necrosis: NO Has patient had a PCN reaction that required hospitalization already in hospital  Has patient had a PCN reaction occurring within the last 10 years: no If all of the above answers are "NO", then may proceed with Cephalosporin use.   . Sulfa Antibiotics Hives and Swelling  . Lorazepam Other (See Comments)    Restless legs     Physical Exam:   Vitals  Blood pressure (!) 160/118, pulse 63, temperature 98.2 F (36.8 C), temperature source Oral, resp. rate 13, weight 43.5 kg, SpO2 100 %.   Elderly thin built female lying in bed, confused in no acute distress HEENT: Pallor present, no icterus, moist mucosa, supple neck Chest: Clear to auscultation bilaterally, no added sounds CVs: Normal S1-S2, no murmurs rub or gallop GI: Soft, nondistended, nontender, bowel sounds present Musculoskeletal: Warm, no edema CNS: AAO x0, nonfocal   Data Review:    CBC Recent Labs  Lab 01/30/2019 2138  WBC 3.4*  HGB 10.0*  HCT 31.0*  PLT 177  MCV 95.4  MCH 30.8  MCHC 32.3  RDW 13.8  LYMPHSABS 0.6*  MONOABS 0.3  EOSABS 0.0  BASOSABS 0.0   ------------------------------------------------------------------------------------------------------------------  Chemistries  Recent Labs  Lab 01/30/2019 2138  NA 138  K 3.6  CL 101  CO2 24  GLUCOSE 136*  BUN 60*  CREATININE 2.46*  CALCIUM 8.5*  AST 71*  ALT 19  ALKPHOS 61  BILITOT 0.5    ------------------------------------------------------------------------------------------------------------------ CrCl cannot be calculated (Unknown ideal weight.). ------------------------------------------------------------------------------------------------------------------ No results for input(s): TSH, T4TOTAL, T3FREE, THYROIDAB in the last 72 hours.  Invalid input(s): FREET3  Coagulation profile No results for input(s): INR, PROTIME in the last 168 hours. ------------------------------------------------------------------------------------------------------------------- No results for input(s): DDIMER in the last 72 hours. -------------------------------------------------------------------------------------------------------------------  Cardiac Enzymes No results for input(s): CKMB, TROPONINI, MYOGLOBIN in the last 168 hours.  Invalid input(s): CK ------------------------------------------------------------------------------------------------------------------    Component Value Date/Time   BNP 3,614.1 (H) 03/02/2017 0112     ---------------------------------------------------------------------------------------------------------------  Urinalysis    Component Value Date/Time   COLORURINE YELLOW 12/03/202020 2141   APPEARANCEUR CLEAR 12/03/202020 2141   LABSPEC 1.015 12/03/202020 2141   PHURINE 5.5 12/03/202020 2141   GLUCOSEU NEGATIVE 12/03/202020 2141   HGBUR SMALL (A) 12/03/202020 2141   BILIRUBINUR NEGATIVE 12/03/202020 2141   KETONESUR NEGATIVE 12/03/202020 2141   PROTEINUR NEGATIVE 12/03/202020 2141   UROBILINOGEN 0.2 04/27/2014 2329   NITRITE NEGATIVE 12/03/202020 2141   LEUKOCYTESUR NEGATIVE 12/03/202020 2141    ----------------------------------------------------------------------------------------------------------------   Imaging Results:    Dg Chest Port 1 View  Result Date: 12/03/202020 CLINICAL DATA:  85 year old female with fever and altered mental status. EXAM:  PORTABLE CHEST 1 VIEW COMPARISON:  Chest radiographs 03/03/2017 and earlier. FINDINGS: Portable AP upright view at 2209 hours. Substantially regressed but not fully resolved left pleural effusion since 2019, small residual. Mildly lower lung volumes. No pneumothorax, pulmonary edema or new pulmonary opacity. Stable cardiac size and mediastinal contours. Small calcified granulomas or chest wall calcifications on the right are unchanged. Negative visible bowel gas pattern. No acute osseous abnormality identified. IMPRESSION: 1. Substantially regressed but not fully resolved left pleural effusion since 2019. 2.  Negative lungs elsewhere.  No new cardiopulmonary abnormality. Electronically Signed   By: Odessa Fleming M.D.   On: 14-Dec-2018 22:43    My personal review of EKG: Sinus rhythm at 29, old LBBB, prolonged QTC of 522   Assessment & Plan:   Principal problem Fever Likely secondary to COVID-19 infection.  No active respiratory symptoms at present.  Check blood culture.  Supportive care with Tylenol and gentle hydration.  Does not need steroids at present. Continue airborne isolation.   will plan on transferring her to [facility] for further care.   Acute kidney injury (HCC) Likely secondary to dehydration and infection.  Avoid nephrotoxins.  Hold Lasix and lisinopril.  Placed on gentle hydration and monitor renal function.   Active Problems: Acute metabolic encephalopathy Likely contributed by fever and AKI.  Check TSH and B12.    Hypothyroidism Resume Synthroid.  Follow TSH  Prolonged QTC We will hold off on her antidepressive meds     Essential hypertension Elevated blood pressure.  Holding Lasix and lisinopril due to AKI.  Increase metoprolol dose.  Will place on PRN hydralazine    GERD (gastroesophageal reflux disease) Resume PPI   Chronic systolic CHF EF of 25 and 30%.  Appears hypovolemic.  Resume beta-blocker.  Holding Lasix and lisinopril.  Monitor with gentle  hydration.  Monitor I's/O and daily weight.     COPD (chronic obstructive pulmonary disease) with emphysema (HCC) Has chronic respiratory failure on 2 L via nasal cannula.  No acute symptoms.     Chronic medications Resume home pain medications   DVT Prophylaxis subcu Lovenox  AM Labs Ordered, also please review Full Orders  Family Communication: None.  Daughter called and left a message.  Code Status DNR.  Discussed goals of care with the daughter.  Wishes and agrees to make her comfort care if she has deterioration in her symptoms including decompensated respiratory failure or shock.  L[facility] to [facility]  Condition: Fair  Consults called: none    Admission status: Observation  Time spent in minutes : 50   Lynsay Fesperman M.D on 12/13/2018 at 4:53 PM  Between 7am to 7pm - Pager - [phone]. After 7pm go to [email or web address] - password [facility]  [facility] - Office  [phone]

## 2018-12-13 NOTE — ED Notes (Signed)
Pts med list reviewed by Dr. Ronnald Nian.  He recommended to give only the  Metoprolol at this time.  We continue to wait for inpt bed placement.

## 2018-12-14 DIAGNOSIS — F0390 Unspecified dementia without behavioral disturbance: Secondary | ICD-10-CM | POA: Diagnosis present

## 2018-12-14 DIAGNOSIS — E039 Hypothyroidism, unspecified: Secondary | ICD-10-CM

## 2018-12-14 DIAGNOSIS — N183 Chronic kidney disease, stage 3 unspecified: Secondary | ICD-10-CM | POA: Diagnosis present

## 2018-12-14 DIAGNOSIS — K219 Gastro-esophageal reflux disease without esophagitis: Secondary | ICD-10-CM | POA: Diagnosis present

## 2018-12-14 DIAGNOSIS — J9621 Acute and chronic respiratory failure with hypoxia: Secondary | ICD-10-CM | POA: Diagnosis present

## 2018-12-14 DIAGNOSIS — Z66 Do not resuscitate: Secondary | ICD-10-CM | POA: Diagnosis present

## 2018-12-14 DIAGNOSIS — M810 Age-related osteoporosis without current pathological fracture: Secondary | ICD-10-CM | POA: Diagnosis present

## 2018-12-14 DIAGNOSIS — E871 Hypo-osmolality and hyponatremia: Secondary | ICD-10-CM | POA: Diagnosis present

## 2018-12-14 DIAGNOSIS — J439 Emphysema, unspecified: Secondary | ICD-10-CM | POA: Diagnosis not present

## 2018-12-14 DIAGNOSIS — J1289 Other viral pneumonia: Secondary | ICD-10-CM | POA: Diagnosis present

## 2018-12-14 DIAGNOSIS — R32 Unspecified urinary incontinence: Secondary | ICD-10-CM | POA: Diagnosis present

## 2018-12-14 DIAGNOSIS — Z9981 Dependence on supplemental oxygen: Secondary | ICD-10-CM | POA: Diagnosis not present

## 2018-12-14 DIAGNOSIS — J9611 Chronic respiratory failure with hypoxia: Secondary | ICD-10-CM

## 2018-12-14 DIAGNOSIS — U071 COVID-19: Secondary | ICD-10-CM | POA: Diagnosis present

## 2018-12-14 DIAGNOSIS — I429 Cardiomyopathy, unspecified: Secondary | ICD-10-CM | POA: Diagnosis present

## 2018-12-14 DIAGNOSIS — Z515 Encounter for palliative care: Secondary | ICD-10-CM | POA: Diagnosis present

## 2018-12-14 DIAGNOSIS — I130 Hypertensive heart and chronic kidney disease with heart failure and stage 1 through stage 4 chronic kidney disease, or unspecified chronic kidney disease: Secondary | ICD-10-CM | POA: Diagnosis present

## 2018-12-14 DIAGNOSIS — N179 Acute kidney failure, unspecified: Secondary | ICD-10-CM | POA: Diagnosis present

## 2018-12-14 DIAGNOSIS — D649 Anemia, unspecified: Secondary | ICD-10-CM | POA: Diagnosis present

## 2018-12-14 DIAGNOSIS — E861 Hypovolemia: Secondary | ICD-10-CM | POA: Diagnosis present

## 2018-12-14 DIAGNOSIS — I5022 Chronic systolic (congestive) heart failure: Secondary | ICD-10-CM | POA: Diagnosis present

## 2018-12-14 DIAGNOSIS — G9341 Metabolic encephalopathy: Secondary | ICD-10-CM | POA: Diagnosis present

## 2018-12-14 DIAGNOSIS — E785 Hyperlipidemia, unspecified: Secondary | ICD-10-CM | POA: Diagnosis present

## 2018-12-14 DIAGNOSIS — J440 Chronic obstructive pulmonary disease with acute lower respiratory infection: Secondary | ICD-10-CM | POA: Diagnosis present

## 2018-12-14 DIAGNOSIS — Z23 Encounter for immunization: Secondary | ICD-10-CM | POA: Diagnosis not present

## 2018-12-14 LAB — CBC
HCT: 27.4 % — ABNORMAL LOW (ref 36.0–46.0)
Hemoglobin: 8.7 g/dL — ABNORMAL LOW (ref 12.0–15.0)
MCH: 30.6 pg (ref 26.0–34.0)
MCHC: 31.8 g/dL (ref 30.0–36.0)
MCV: 96.5 fL (ref 80.0–100.0)
Platelets: 146 10*3/uL — ABNORMAL LOW (ref 150–400)
RBC: 2.84 MIL/uL — ABNORMAL LOW (ref 3.87–5.11)
RDW: 13.9 % (ref 11.5–15.5)
WBC: 2.9 10*3/uL — ABNORMAL LOW (ref 4.0–10.5)
nRBC: 0 % (ref 0.0–0.2)

## 2018-12-14 LAB — BASIC METABOLIC PANEL
Anion gap: 13 (ref 5–15)
BUN: 47 mg/dL — ABNORMAL HIGH (ref 8–23)
CO2: 19 mmol/L — ABNORMAL LOW (ref 22–32)
Calcium: 8 mg/dL — ABNORMAL LOW (ref 8.9–10.3)
Chloride: 110 mmol/L (ref 98–111)
Creatinine, Ser: 1.85 mg/dL — ABNORMAL HIGH (ref 0.44–1.00)
GFR calc Af Amer: 26 mL/min — ABNORMAL LOW (ref >60)
GFR calc non Af Amer: 22 mL/min — ABNORMAL LOW (ref >60)
Glucose, Bld: 134 mg/dL — ABNORMAL HIGH (ref 70–99)
Potassium: 3.7 mmol/L (ref 3.5–5.1)
Sodium: 142 mmol/L (ref 135–145)

## 2018-12-14 LAB — URINE CULTURE: Culture: 30000 — AB

## 2018-12-14 LAB — TSH: TSH: 2.336 u[IU]/mL (ref 0.350–4.500)

## 2018-12-14 LAB — MRSA PCR SCREENING: MRSA by PCR: POSITIVE — AB

## 2018-12-14 LAB — VITAMIN B12: Vitamin B-12: 1207 pg/mL — ABNORMAL HIGH (ref 180–914)

## 2018-12-14 MED ORDER — METHYLPREDNISOLONE SODIUM SUCC 40 MG IJ SOLR
1.0000 mg/kg/d | Freq: Two times a day (BID) | INTRAMUSCULAR | Status: DC
  Administered 2018-12-14 – 2018-12-17 (×4): 21.6 mg via INTRAVENOUS
  Filled 2018-12-14 (×4): qty 1

## 2018-12-14 MED ORDER — LIP MEDEX EX OINT
TOPICAL_OINTMENT | CUTANEOUS | Status: DC | PRN
  Filled 2018-12-14 (×2): qty 7

## 2018-12-14 NOTE — Progress Notes (Signed)
PROGRESS NOTE    Pamela Peterson  AVW:[id number] DOB: 26-Nov-1919 DOA: 12/27/2018 PCP: Leola Brazil, DO   Brief Narrative: Pamela Peterson is a 85 y.o. female, with history of hypertension, severe cardiomyopathy with EF of 25-30% as per prior echo, depression and anxiety, moderate to severe dementia, hypothyroidism, COPD on chronic home O2. Patient presented secondary to mental status change and found to be COVID positive with concern for a UTI.   Assessment & Plan:   Active Problems:   Hypothyroidism   Essential hypertension   GERD (gastroesophageal reflux disease)   Hyponatremia   AKI (acute kidney injury) (HCC)   Normochromic normocytic anemia   ARF (acute renal failure) (HCC)   Acute metabolic encephalopathy   COPD (chronic obstructive pulmonary disease) with emphysema (HCC)   Fever in adult   COVID-19 infection Patient is not symptomatic. No evidence of pneumonia. Associated fevers. Chronic hypoxia not greater than baseline. -Start Solu-medrol 1 mg/kg/day divided BID -Daily CMP, CBC, CRP, D-dimer, Ferritin -Transfer to [facility] when bed is available  AKI on CKD stage III Baseline creatinine of about 1.5. Creatinine of 2.46 on admission. Started on IV fluids and creatinine down to 1.85 today. -Discontinue IV fluids secondary to above/below problems  Chronic systolic heart failure Last Transthoracic Echocardiogram significant for an EF of 25-30% -Watch fluid status carefully -Daily weights/in and out  Hypothyroidism -Continue Synthroid  Acute metabolic encephalopathy Patient with underlying cognitive impairment. Possibly secondary to infection vs delirium vs combination.  Essential hypertension -Continue metoprolol  Prolonged QTc Noted.  GERD -Continue Protonix  COPD Chronic respiratory failure On chronic 2 L    DVT prophylaxis: Lovenox Code Status:   Code Status: DNR Family Communication: Daughter on telephone Disposition Plan: Discharge complicated since  patient is from ALF   Consultants:   None  Procedures:   None  Antimicrobials:  None    Subjective: Some mild abdominal pain this morning which is now gone.  Objective: Vitals:   12/14/18 0124 12/14/18 0324 12/14/18 0656 12/14/18 1100  BP: (!) 160/109 (!) 148/115 (!) 154/129 140/85  Pulse: 68 78 78 71  Resp: (!) 22  Temp: 98.6 F (37 C) 97.9 F (36.6 C) 97.9 F (36.6 C) 99.1 F (37.3 C)  TempSrc: Oral Oral Oral Oral  SpO2: 100% 92% 100% 99%  Weight:        Intake/Output Summary (Last 24 hours) at 12/14/2018 1452 Last data filed at 12/14/2018 1000 Gross per 24 hour  Intake 2280.78 ml  Output -  Net 2280.78 ml   Filed Weights   12/15/2018 2150 12/13/18 1933  Weight: 43.5 kg 43.3 kg    Examination:  General exam: Appears calm and comfortable Respiratory system: Diminished Respiratory effort normal. Cardiovascular system: S1 & S2 heard, RRR. No murmurs, rubs, gallops or clicks. Gastrointestinal system: Abdomen is nondistended, soft and nontender. No organomegaly or masses felt. Normal bowel sounds heard. Central nervous system: Alert and oriented to person. Extremities: No edema. No calf tenderness Skin: No cyanosis. No rashes Psychiatry: Judgement and insight appear normal. Mood & affect appropriate.     Data Reviewed: I have personally reviewed following labs and imaging studies  CBC: Recent Labs  Lab 12/20/2018 2138 12/13/18 1722 12/14/18 0450  WBC 3.4* 3.6* 2.9*  NEUTROABS 2.5  --   --   HGB 10.0* 8.7* 8.7*  HCT 31.0* 27.6* 27.4*  MCV 95.4 96.8 96.5  PLT 177 154 146*   Basic Metabolic Panel: Recent Labs  Lab 12/20/2018 2138 12/13/18  1722 12/14/18 0450  NA 138 140 142  K 3.6 3.5 3.7  CL 101 105 110  CO2 24 24 19*  GLUCOSE 136* 119* 134*  BUN 60* 52* 47*  CREATININE 2.46* 1.94* 1.85*  CALCIUM 8.5* 8.0* 8.0*   GFR: CrCl cannot be calculated (Unknown ideal weight.). Liver Function Tests: Recent Labs  Lab 01-06-19 2138  AST  71*  ALT 19  ALKPHOS 61  BILITOT 0.5  PROT 6.6  ALBUMIN 3.8   No results for input(s): LIPASE, AMYLASE in the last 168 hours. No results for input(s): AMMONIA in the last 168 hours. Coagulation Profile: No results for input(s): INR, PROTIME in the last 168 hours. Cardiac Enzymes: No results for input(s): CKTOTAL, CKMB, CKMBINDEX, TROPONINI in the last 168 hours. BNP (last 3 results) No results for input(s): PROBNP in the last 8760 hours. HbA1C: No results for input(s): HGBA1C in the last 72 hours. CBG: No results for input(s): GLUCAP in the last 168 hours. Lipid Profile: No results for input(s): CHOL, HDL, LDLCALC, TRIG, CHOLHDL, LDLDIRECT in the last 72 hours. Thyroid Function Tests: Recent Labs    12/14/18 0450  TSH 2.336   Anemia Panel: Recent Labs    12/14/18 0450  VITAMINB12 1,207*   Sepsis Labs: No results for input(s): PROCALCITON, LATICACIDVEN in the last 168 hours.  Recent Results (from the past 240 hour(s))  SARS CORONAVIRUS 2 (TAT 6-24 HRS) Nasopharyngeal Nasopharyngeal Swab     Status: Abnormal   Collection Time: 2019/01/06 12:35 AM   Specimen: Nasopharyngeal Swab  Result Value Ref Range Status   SARS Coronavirus 2 POSITIVE (A) NEGATIVE Final    Comment: CRITICAL RESULT CALLED TO, READ BACK BY AND VERIFIED WITH: K.OBERRY RN 1643 12/13/2018 MCCORMICK K (NOTE) SARS-CoV-2 target nucleic acids are DETECTED. The SARS-CoV-2 RNA is generally detectable in upper and lower respiratory specimens during the acute phase of infection. Positive results are indicative of active infection with SARS-CoV-2. Clinical  correlation with patient history and other diagnostic information is necessary to determine patient infection status. Positive results do  not rule out bacterial infection or co-infection with other viruses. The expected result is Negative. Fact Sheet for Patients: HairSlick.no Fact Sheet for Healthcare Providers:  quierodirigir.com This test is not yet approved or cleared by the [place] FDA and  has been authorized for detection and/or diagnosis of SARS-CoV-2 by FDA under an Emergency Use Authorization (EUA). This EUA will remain  in effect (meaning this test can b e used) for the duration of the COVID-19 declaration under Section 564(b)(1) of the Act, 21 U.S.C. section 360bbb-3(b)(1), unless the authorization is terminated or revoked sooner. Performed at [facility], [address] [place], [place] [address]   Urine Culture     Status: Abnormal   Collection Time: 2019-01-06  9:41 PM   Specimen: Urine, Random  Result Value Ref Range Status   Specimen Description   Final    URINE, RANDOM Performed at [facility], [address] [place], [place] [address]    Special Requests   Final    NONE Performed at [facility], [address] [place], [place] [address]    Culture (A)  Final    30,000 COLONIES/mL MULTIPLE SPECIES PRESENT, SUGGEST RECOLLECTION   Report Status 12/14/2018 FINAL  Final  Culture, blood (routine x 2)     Status: None (Preliminary result)   Collection Time: 12/13/18  5:20 PM   Specimen: BLOOD LEFT HAND  Result  Value Ref Range Status   Specimen Description   Final    BLOOD LEFT HAND Performed at [facility], [address] [place], [place] [address]    Special Requests   Final    BOTTLES DRAWN AEROBIC ONLY Blood Culture results may not be optimal due to an inadequate volume of blood received in culture bottles Performed at [facility], [address] [place], [place] [address]    Culture   Final    NO GROWTH < 24 HOURS Performed at [facility], [address] [place], [place] [address]    Report Status PENDING  Incomplete  Culture, blood (routine x 2)     Status: None (Preliminary result)   Collection Time: 12/13/18  5:22 PM   Specimen: BLOOD RIGHT HAND   Result Value Ref Range Status   Specimen Description   Final    BLOOD RIGHT HAND Performed at [facility], [address] [place], [place] [address]    Special Requests   Final    BOTTLES DRAWN AEROBIC ONLY Blood Culture adequate volume Performed at [facility], [address] [place], [place] [address]    Culture   Final    NO GROWTH < 24 HOURS Performed at [facility], [address] [place], [place] [address]    Report Status PENDING  Incomplete  MRSA PCR Screening     Status: Abnormal   Collection Time: 12/13/18  7:21 PM   Specimen: Nasopharyngeal  Result Value Ref Range Status   MRSA by PCR POSITIVE (A) NEGATIVE Final    Comment:        The GeneXpert MRSA Assay (FDA approved for NASAL specimens only), is one component of a comprehensive MRSA colonization surveillance program. It is not intended to diagnose MRSA infection nor to guide or monitor treatment for MRSA infections. RESULT CALLED TO, READ BACK BY AND VERIFIED WITH: A BROWN,RN 12/14/18 1125 RHOLMES Performed at [facility], [address] [place], [place] [address]          Radiology Studies: Dg Chest Port 1 View  Result Date: 2019/01/09 CLINICAL DATA:  [age] year old female with fever and altered mental status. EXAM: PORTABLE CHEST 1 VIEW COMPARISON:  Chest radiographs 03/03/2017 and earlier. FINDINGS: Portable AP upright view at 2209 hours. Substantially regressed but not fully resolved left pleural effusion since 2019, small residual. Mildly lower lung volumes. No pneumothorax, pulmonary edema or new pulmonary opacity. Stable cardiac size and mediastinal contours. Small calcified granulomas or chest wall calcifications on the right are unchanged. Negative visible bowel gas pattern. No acute osseous abnormality identified. IMPRESSION: 1. Substantially regressed but not fully resolved left pleural effusion since 2019. 2. Negative lungs  elsewhere.  No new cardiopulmonary abnormality. Electronically Signed   By: Odessa FlemingH  Hall M.D.   On: 2019/01/09 22:43        Scheduled Meds: . cholecalciferol  125 mcg Oral Daily  . enoxaparin (LOVENOX) injection  30 mg Subcutaneous Q24H  . feeding supplement (ENSURE ENLIVE)  237 mL Oral BID BM  . influenza vaccine adjuvanted  0.5 mL Intramuscular Tomorrow-1000  . levothyroxine  25 mcg Oral Q0600  . mouth rinse  15 mL Mouth Rinse BID  . metoprolol tartrate  25 mg Oral Daily  . potassium chloride  20 mEq Oral BID  . pregabalin  75 mg Oral Daily  . psyllium  1 packet Oral Daily   Continuous Infusions: . sodium  chloride 75 mL/hr at 12/13/18 0144  . sodium chloride 75 mL/hr at 12/14/18 0440     LOS: 0 days     Cordelia Poche, MD [facility] 12/14/2018, 2:52 PM  If 7PM-7AM, please contact night-coverage [email or web address]

## 2018-12-15 LAB — CBC WITH DIFFERENTIAL/PLATELET
Abs Immature Granulocytes: 0.04 10*3/uL (ref 0.00–0.07)
Basophils Absolute: 0 10*3/uL (ref 0.0–0.1)
Basophils Relative: 0 %
Eosinophils Absolute: 0 10*3/uL (ref 0.0–0.5)
Eosinophils Relative: 0 %
HCT: 28.6 % — ABNORMAL LOW (ref 36.0–46.0)
Hemoglobin: 8.9 g/dL — ABNORMAL LOW (ref 12.0–15.0)
Immature Granulocytes: 1 %
Lymphocytes Relative: 8 %
Lymphs Abs: 0.4 10*3/uL — ABNORMAL LOW (ref 0.7–4.0)
MCH: 30.3 pg (ref 26.0–34.0)
MCHC: 31.1 g/dL (ref 30.0–36.0)
MCV: 97.3 fL (ref 80.0–100.0)
Monocytes Absolute: 0.2 10*3/uL (ref 0.1–1.0)
Monocytes Relative: 4 %
Neutro Abs: 3.9 10*3/uL (ref 1.7–7.7)
Neutrophils Relative %: 87 %
Platelets: 183 10*3/uL (ref 150–400)
RBC: 2.94 MIL/uL — ABNORMAL LOW (ref 3.87–5.11)
RDW: 14.2 % (ref 11.5–15.5)
WBC: 4.4 10*3/uL (ref 4.0–10.5)
nRBC: 0 % (ref 0.0–0.2)

## 2018-12-15 LAB — C-REACTIVE PROTEIN: CRP: 10.6 mg/dL — ABNORMAL HIGH (ref <1.0)

## 2018-12-15 LAB — COMPREHENSIVE METABOLIC PANEL
ALT: 15 U/L (ref 0–44)
AST: 60 U/L — ABNORMAL HIGH (ref 15–41)
Albumin: 3.3 g/dL — ABNORMAL LOW (ref 3.5–5.0)
Alkaline Phosphatase: 44 U/L (ref 38–126)
Anion gap: 14 (ref 5–15)
BUN: 48 mg/dL — ABNORMAL HIGH (ref 8–23)
CO2: 19 mmol/L — ABNORMAL LOW (ref 22–32)
Calcium: 8.6 mg/dL — ABNORMAL LOW (ref 8.9–10.3)
Chloride: 113 mmol/L — ABNORMAL HIGH (ref 98–111)
Creatinine, Ser: 2.02 mg/dL — ABNORMAL HIGH (ref 0.44–1.00)
GFR calc Af Amer: 23 mL/min — ABNORMAL LOW (ref >60)
GFR calc non Af Amer: 20 mL/min — ABNORMAL LOW (ref >60)
Glucose, Bld: 157 mg/dL — ABNORMAL HIGH (ref 70–99)
Potassium: 4.2 mmol/L (ref 3.5–5.1)
Sodium: 146 mmol/L — ABNORMAL HIGH (ref 135–145)
Total Bilirubin: 1.1 mg/dL (ref 0.3–1.2)
Total Protein: 6.2 g/dL — ABNORMAL LOW (ref 6.5–8.1)

## 2018-12-15 LAB — FERRITIN: Ferritin: 1024 ng/mL — ABNORMAL HIGH (ref 11–307)

## 2018-12-15 LAB — D-DIMER, QUANTITATIVE: D-Dimer, Quant: >20 ug/mL-FEU — ABNORMAL HIGH (ref 0.00–0.50)

## 2018-12-15 MED ORDER — HEPARIN SODIUM (PORCINE) 10000 UNIT/ML IJ SOLN
7500.0000 [IU] | Freq: Three times a day (TID) | INTRAMUSCULAR | Status: DC
  Administered 2018-12-15 – 2018-12-16 (×2): 7500 [IU] via SUBCUTANEOUS
  Filled 2018-12-15 (×2): qty 1

## 2018-12-15 MED ORDER — DEXTROSE-NACL 5-0.45 % IV SOLN
INTRAVENOUS | Status: DC
  Administered 2018-12-15: 17:00:00 via INTRAVENOUS

## 2018-12-15 MED ORDER — ENOXAPARIN SODIUM 30 MG/0.3ML ~~LOC~~ SOLN: 30.0000 mg | Freq: Two times a day (BID) | SUBCUTANEOUS | Status: DC

## 2018-12-15 NOTE — Progress Notes (Addendum)
PROGRESS NOTE    Pamela Peterson  QZE:[id number] DOB: 20-Aug-1919 DOA: 12/13/2018 PCP: Leola Brazil, DO   Brief Narrative: Pamela Peterson is a 85 y.o. female, with history of hypertension, severe cardiomyopathy with EF of 25-30% as per prior echo, depression and anxiety, moderate to severe dementia, hypothyroidism, COPD on chronic home O2. Patient presented secondary to mental status change and found to be COVID positive with concern for a UTI.   Assessment & Plan:   Active Problems:   Hypothyroidism   Essential hypertension   GERD (gastroesophageal reflux disease)   Hyponatremia   AKI (acute kidney injury) (HCC)   Normochromic normocytic anemia   ARF (acute renal failure) (HCC)   Acute metabolic encephalopathy   COPD (chronic obstructive pulmonary disease) with emphysema (HCC)   Fever in adult   COVID-19 infection Patient is not symptomatic. No evidence of pneumonia. Associated fevers. Chronic hypoxia not greater than baseline. D-dimer significantly elevated. Goals of care discussions with daughter on 10/17. If patient significantly worsens, would transition to comfort measures with pain/dyspnea management -Continue Solu-medrol 1 mg/kg/day divided BID -Daily CMP, CBC, CRP, D-dimer, Ferritin -Transfer to [facility] when bed is available -Will start intermediate VTE prophylaxis dosing: Heparin 7500 units q8 hours  AKI on CKD stage III Baseline creatinine of about 1.5. Creatinine of 2.46 on admission. Started on IV fluids and creatinine down to 1.85 initially but now back up in setting of poor oral intake by patient -Restart fluids at reduced dose secondary to COVID-19 infection in addition to baseline poor heart function  Chronic systolic heart failure Last Transthoracic Echocardiogram significant for an EF of 25-30% -Watch fluid status carefully -Daily weights/in and out  Hypothyroidism -Continue Synthroid  Acute metabolic encephalopathy Acute delirium Patient on admission  appeared stabile from a mental capacity standpoint but today she is clearly delirious likely from initiation of IV steroids for COVID-19 treatment. Currently attempting to get out of bed. Telesitter not effective and no available sitters. Urine/blood cultures obtained. Urine culture significant for multiple species and blood cultures no growth x2 days. -Non-violent restraints for now (lap belt); reassess daily  Essential hypertension -Continue metoprolol  Prolonged QTc Noted. Consider medications that may cause prolonged QTc  GERD -Continue Protonix  COPD Chronic respiratory failure On chronic 2 L. Stable.   DVT prophylaxis: Heparin subq Code Status:   Code Status: DNR Family Communication: Called daughter on telephone to update with no response Disposition Plan: Discharge complicated since patient is from ALF; transfer to [facility] when bed available   Consultants:   None  Procedures:   None  Antimicrobials:  None    Subjective: Unable to provide history secondary to mental status  Objective: Vitals:   12/14/18 2313 12/15/18 0312 12/15/18 0851 12/15/18 0900  BP: (!) 159/82 (!) 143/93 (!) 180/123 (!) 165/72  Pulse: 71 75 67   Resp:  20 (!) 32   Temp: 99.4 F (37.4 C) 99.7 F (37.6 C) 99.1 F (37.3 C)   TempSrc: Oral  Oral   SpO2: 91% 92% 94%   Weight:        Intake/Output Summary (Last 24 hours) at 12/15/2018 1217 Last data filed at 12/15/2018 1000 Gross per 24 hour  Intake 538.49 ml  Output -  Net 538.49 ml   Filed Weights   12/14/2018 2150 12/13/18 1933  Weight: 43.5 kg 43.3 kg    Examination:  General exam: Appears calm and comfortable Respiratory system: Clear to auscultation. Respiratory effort normal. Cardiovascular system: S1 & S2 heard, RRR.  No murmurs, rubs, gallops or clicks. Gastrointestinal system: Abdomen is nondistended, soft and nontender. No organomegaly or masses felt. Normal bowel sounds heard. Central nervous system: Alert and  disoriented. Extremities: No edema. No calf tenderness Skin: No cyanosis. No rashes Psychiatry: Judgement and insight appear impaired. Slightly agitated    Data Reviewed: I have personally reviewed following labs and imaging studies  CBC: Recent Labs  Lab 12/29/18 2138 12/13/18 1722 12/14/18 0450 12/15/18 0443  WBC 3.4* 3.6* 2.9* 4.4  NEUTROABS 2.5  --   --  3.9  HGB 10.0* 8.7* 8.7* 8.9*  HCT 31.0* 27.6* 27.4* 28.6*  MCV 95.4 96.8 96.5 97.3  PLT 177 154 146* 183   Basic Metabolic Panel: Recent Labs  Lab December 29, 2018 2138 12/13/18 1722 12/14/18 0450 12/15/18 0443  NA 138 140 142 146*  K 3.6 3.5 3.7 4.2  CL 101 105 110 113*  CO2 24 24 19* 19*  GLUCOSE 136* 119* 134* 157*  BUN 60* 52* 47* 48*  CREATININE 2.46* 1.94* 1.85* 2.02*  CALCIUM 8.5* 8.0* 8.0* 8.6*   GFR: CrCl cannot be calculated (Unknown ideal weight.). Liver Function Tests: Recent Labs  Lab 12/29/2018 2138 12/15/18 0443  AST 71* 60*  ALT 19 15  ALKPHOS 61 44  BILITOT 0.5 1.1  PROT 6.6 6.2*  ALBUMIN 3.8 3.3*   No results for input(s): LIPASE, AMYLASE in the last 168 hours. No results for input(s): AMMONIA in the last 168 hours. Coagulation Profile: No results for input(s): INR, PROTIME in the last 168 hours. Cardiac Enzymes: No results for input(s): CKTOTAL, CKMB, CKMBINDEX, TROPONINI in the last 168 hours. BNP (last 3 results) No results for input(s): PROBNP in the last 8760 hours. HbA1C: No results for input(s): HGBA1C in the last 72 hours. CBG: No results for input(s): GLUCAP in the last 168 hours. Lipid Profile: No results for input(s): CHOL, HDL, LDLCALC, TRIG, CHOLHDL, LDLDIRECT in the last 72 hours. Thyroid Function Tests: Recent Labs    12/14/18 0450  TSH 2.336   Anemia Panel: Recent Labs    12/14/18 0450 12/15/18 0443  VITAMINB12 1,207*  --   FERRITIN  --  1,024*   Sepsis Labs: No results for input(s): PROCALCITON, LATICACIDVEN in the last 168 hours.  Recent Results (from  the past 240 hour(s))  SARS CORONAVIRUS 2 (TAT 6-24 HRS) Nasopharyngeal Nasopharyngeal Swab     Status: Abnormal   Collection Time: 12-29-2018 12:35 AM   Specimen: Nasopharyngeal Swab  Result Value Ref Range Status   SARS Coronavirus 2 POSITIVE (A) NEGATIVE Final    Comment: CRITICAL RESULT CALLED TO, READ BACK BY AND VERIFIED WITH: K.OBERRY RN 1643 12/13/2018 MCCORMICK K (NOTE) SARS-CoV-2 target nucleic acids are DETECTED. The SARS-CoV-2 RNA is generally detectable in upper and lower respiratory specimens during the acute phase of infection. Positive results are indicative of active infection with SARS-CoV-2. Clinical  correlation with patient history and other diagnostic information is necessary to determine patient infection status. Positive results do  not rule out bacterial infection or co-infection with other viruses. The expected result is Negative. Fact Sheet for Patients: HairSlick.no Fact Sheet for Healthcare Providers: quierodirigir.com This test is not yet approved or cleared by the [place] FDA and  has been authorized for detection and/or diagnosis of SARS-CoV-2 by FDA under an Emergency Use Authorization (EUA). This EUA will remain  in effect (meaning this test can b e used) for the duration of the COVID-19 declaration under Section 564(b)(1) of the Act, 21 U.S.C. section 360bbb-3(b)(1),  unless the authorization is terminated or revoked sooner. Performed at [facility], [address] [place], [place] [address]   Urine Culture     Status: Abnormal   Collection Time: 25-Sep-2018  9:41 PM   Specimen: Urine, Random  Result Value Ref Range Status   Specimen Description   Final    URINE, RANDOM Performed at [facility], [address] [place], [place] [address]    Special Requests   Final    NONE Performed at [facility], [address] [place], [place] [address]    Culture (A)   Final    30,000 COLONIES/mL MULTIPLE SPECIES PRESENT, SUGGEST RECOLLECTION   Report Status 12/14/2018 FINAL  Final  Culture, blood (routine x 2)     Status: None (Preliminary result)   Collection Time: 12/13/18  5:20 PM   Specimen: BLOOD LEFT HAND  Result Value Ref Range Status   Specimen Description   Final    BLOOD LEFT HAND Performed at [facility], [address] [place], [place] [address]    Special Requests   Final    BOTTLES DRAWN AEROBIC ONLY Blood Culture results may not be optimal due to an inadequate volume of blood received in culture bottles Performed at [facility], [address] [place], [place] [address]    Culture   Final    NO GROWTH 2 DAYS Performed at [facility], [address] [place], [place] [address]    Report Status PENDING  Incomplete  Culture, blood (routine x 2)     Status: None (Preliminary result)   Collection Time: 12/13/18  5:22 PM   Specimen: BLOOD RIGHT HAND  Result Value Ref Range Status   Specimen Description   Final    BLOOD RIGHT HAND Performed at [facility], [address] [place], [place] [address]    Special Requests   Final    BOTTLES DRAWN AEROBIC ONLY Blood Culture adequate volume Performed at [facility], [address] [place], [place] [address]    Culture   Final    NO GROWTH 2 DAYS Performed at [facility], [address] [place], [place] [address]    Report Status PENDING  Incomplete  MRSA PCR Screening     Status: Abnormal   Collection Time: 12/13/18  7:21 PM   Specimen: Nasopharyngeal  Result Value Ref Range Status   MRSA by PCR POSITIVE (A) NEGATIVE Final    Comment:        The GeneXpert MRSA Assay (FDA approved for NASAL specimens only), is one component of a comprehensive MRSA colonization surveillance program. It is not intended to diagnose MRSA infection nor to guide or monitor treatment for MRSA infections.  RESULT CALLED TO, READ BACK BY AND VERIFIED WITH: A BROWN,RN 12/14/18 1125 RHOLMES Performed at [facility], [address] [place], [place] [address]          Radiology Studies: No results found.      Scheduled Meds: . cholecalciferol  125 mcg Oral Daily  . enoxaparin (LOVENOX) injection  30 mg Subcutaneous Q24H  . feeding supplement (ENSURE ENLIVE)  237 mL Oral BID BM  . influenza vaccine adjuvanted  0.5 mL Intramuscular Tomorrow-1000  . levothyroxine  25 mcg Oral Q0600  . mouth rinse  15 mL Mouth Rinse BID  . methylPREDNISolone (SOLU-MEDROL) injection  1 mg/kg/day Intravenous Q12H  . metoprolol  tartrate  25 mg Oral Daily  . potassium chloride  20 mEq Oral BID  . pregabalin  75 mg Oral Daily  . psyllium  1 packet Oral Daily   Continuous Infusions:    LOS: 1 day     Cordelia Poche, MD [facility] 12/15/2018, 12:17 PM  If 7PM-7AM, please contact night-coverage [email or web address]

## 2018-12-16 LAB — COMPREHENSIVE METABOLIC PANEL
ALT: 16 U/L (ref 0–44)
AST: 51 U/L — ABNORMAL HIGH (ref 15–41)
Albumin: 3.2 g/dL — ABNORMAL LOW (ref 3.5–5.0)
Alkaline Phosphatase: 42 U/L (ref 38–126)
Anion gap: 10 (ref 5–15)
BUN: 51 mg/dL — ABNORMAL HIGH (ref 8–23)
CO2: 20 mmol/L — ABNORMAL LOW (ref 22–32)
Calcium: 8.7 mg/dL — ABNORMAL LOW (ref 8.9–10.3)
Chloride: 116 mmol/L — ABNORMAL HIGH (ref 98–111)
Creatinine, Ser: 1.89 mg/dL — ABNORMAL HIGH (ref 0.44–1.00)
GFR calc Af Amer: 25 mL/min — ABNORMAL LOW (ref >60)
GFR calc non Af Amer: 22 mL/min — ABNORMAL LOW (ref >60)
Glucose, Bld: 161 mg/dL — ABNORMAL HIGH (ref 70–99)
Potassium: 4 mmol/L (ref 3.5–5.1)
Sodium: 146 mmol/L — ABNORMAL HIGH (ref 135–145)
Total Bilirubin: 0.4 mg/dL (ref 0.3–1.2)
Total Protein: 6 g/dL — ABNORMAL LOW (ref 6.5–8.1)

## 2018-12-16 LAB — C-REACTIVE PROTEIN: CRP: 12.4 mg/dL — ABNORMAL HIGH (ref <1.0)

## 2018-12-16 LAB — CBC WITH DIFFERENTIAL/PLATELET
Abs Immature Granulocytes: 0.14 10*3/uL — ABNORMAL HIGH (ref 0.00–0.07)
Basophils Absolute: 0 10*3/uL (ref 0.0–0.1)
Basophils Relative: 0 %
Eosinophils Absolute: 0 10*3/uL (ref 0.0–0.5)
Eosinophils Relative: 0 %
HCT: 30.2 % — ABNORMAL LOW (ref 36.0–46.0)
Hemoglobin: 9.2 g/dL — ABNORMAL LOW (ref 12.0–15.0)
Immature Granulocytes: 2 %
Lymphocytes Relative: 5 %
Lymphs Abs: 0.4 10*3/uL — ABNORMAL LOW (ref 0.7–4.0)
MCH: 30.8 pg (ref 26.0–34.0)
MCHC: 30.5 g/dL (ref 30.0–36.0)
MCV: 101 fL — ABNORMAL HIGH (ref 80.0–100.0)
Monocytes Absolute: 0.3 10*3/uL (ref 0.1–1.0)
Monocytes Relative: 4 %
Neutro Abs: 6.3 10*3/uL (ref 1.7–7.7)
Neutrophils Relative %: 89 %
Platelets: 191 10*3/uL (ref 150–400)
RBC: 2.99 MIL/uL — ABNORMAL LOW (ref 3.87–5.11)
RDW: 14.5 % (ref 11.5–15.5)
WBC: 7.1 10*3/uL (ref 4.0–10.5)
nRBC: 0.3 % — ABNORMAL HIGH (ref 0.0–0.2)

## 2018-12-16 LAB — D-DIMER, QUANTITATIVE: D-Dimer, Quant: >20 ug/mL-FEU — ABNORMAL HIGH (ref 0.00–0.50)

## 2018-12-16 LAB — FERRITIN: Ferritin: 1376 ng/mL — ABNORMAL HIGH (ref 11–307)

## 2018-12-16 MED ORDER — LORAZEPAM 2 MG/ML IJ SOLN
0.5000 mg | Freq: Once | INTRAMUSCULAR | Status: AC
  Administered 2018-12-16: 0.5 mg via INTRAVENOUS
  Filled 2018-12-16: qty 1

## 2018-12-16 MED ORDER — HALOPERIDOL 0.5 MG PO TABS
0.5000 mg | ORAL_TABLET | ORAL | Status: DC | PRN
  Filled 2018-12-16: qty 1

## 2018-12-16 MED ORDER — GLYCOPYRROLATE 0.2 MG/ML IJ SOLN
0.2000 mg | INTRAMUSCULAR | Status: DC | PRN
  Filled 2018-12-16: qty 1

## 2018-12-16 MED ORDER — GLYCOPYRROLATE 1 MG PO TABS
1.0000 mg | ORAL_TABLET | ORAL | Status: DC | PRN
  Filled 2018-12-16: qty 1

## 2018-12-16 MED ORDER — HALOPERIDOL LACTATE 2 MG/ML PO CONC
0.5000 mg | ORAL | Status: DC | PRN
  Filled 2018-12-16: qty 0.3

## 2018-12-16 MED ORDER — LORAZEPAM 2 MG/ML IJ SOLN: 1.0000 mg | Freq: Once | INTRAMUSCULAR | Status: DC

## 2018-12-16 MED ORDER — MORPHINE SULFATE (PF) 2 MG/ML IV SOLN
1.0000 mg | INTRAVENOUS | Status: DC | PRN
  Administered 2018-12-16 – 2018-12-17 (×3): 1 mg via INTRAVENOUS
  Filled 2018-12-16 (×3): qty 1

## 2018-12-16 MED ORDER — HEPARIN SODIUM (PORCINE) 5000 UNIT/ML IJ SOLN
5000.0000 [IU] | Freq: Three times a day (TID) | INTRAMUSCULAR | Status: DC
  Administered 2018-12-16 – 2018-12-17 (×2): 5000 [IU] via SUBCUTANEOUS
  Filled 2018-12-16: qty 1

## 2018-12-16 MED ORDER — HALOPERIDOL LACTATE 5 MG/ML IJ SOLN
0.5000 mg | INTRAMUSCULAR | Status: DC | PRN
  Administered 2018-12-16: 0.5 mg via INTRAVENOUS
  Filled 2018-12-16: qty 1

## 2018-12-16 MED ORDER — LORAZEPAM 2 MG/ML IJ SOLN: 0.5000 mg | INTRAMUSCULAR | Status: DC | PRN

## 2018-12-16 NOTE — Progress Notes (Signed)
Attempted to feed pt some applesauce. She kept turning her head and looking away uninterested. Pt has no appetite this morning.

## 2018-12-16 NOTE — Progress Notes (Signed)
Pharmacy: SQ Heparin dose adjustment   SQ heparin 7500 units q8h reduced to 5000 units sq q8h due to low weight of 43 kg and CrCl < 30 ml/min  Eudelia Bunch, Pharm.D [phone] 12/16/2018 1:32 PM

## 2018-12-16 NOTE — Progress Notes (Signed)
PROGRESS NOTE    Pamela KindleMary Sherburn  ZOX:[id number] DOB: 01/04/1920 DOA: 12/23/2018 PCP: Leola BrazilEverly, Rebecca B, DO   Brief Narrative: Pamela Peterson is a 85 y.o. female, with history of hypertension, severe cardiomyopathy with EF of 25-30% as per prior echo, depression and anxiety, moderate to severe dementia, hypothyroidism, COPD on chronic home O2. Patient presented secondary to mental status change and found to be COVID positive with concern for a UTI.   Assessment & Plan:   Active Problems:   Hypothyroidism   Essential hypertension   GERD (gastroesophageal reflux disease)   Hyponatremia   AKI (acute kidney injury) (HCC)   Normochromic normocytic anemia   ARF (acute renal failure) (HCC)   Acute metabolic encephalopathy   COPD (chronic obstructive pulmonary disease) with emphysema (HCC)   Fever in adult   COVID-19 infection Patient is not symptomatic. No evidence of pneumonia. Associated fevers. Chronic hypoxia not greater than baseline. D-dimer significantly elevated. Goals of care discussions with daughter on 10/17. If patient significantly worsens, would transition to comfort measures with pain/dyspnea management -Continue Solu-medrol 1 mg/kg/day divided BID -Daily CMP, CBC, CRP, D-dimer, Ferritin -[facility] -Continue intermediate VTE prophylaxis dosing: Heparin 7500 units q8 hours -No escalation of care; will add morphine for pain/dyspnea  AKI on CKD stage III Baseline creatinine of about 1.5. Creatinine of 2.46 on admission. Started on IV fluids and creatinine down to 1.85 initially but now back up in setting of poor oral intake by patient -Discontinue IV fluids  Chronic systolic heart failure Last Transthoracic Echocardiogram significant for an EF of 25-30% -Watch fluid status carefully -Daily weights/in and out  Hypothyroidism -Continue Synthroid  Acute metabolic encephalopathy Acute delirium Patient on admission appeared  stabile from a mental capacity standpoint but today she is clearly delirious likely from initiation of IV steroids for COVID-19 treatment. Currently attempting to get out of bed. Telesitter not effective and no available sitters. Urine/blood cultures obtained. Urine culture significant for multiple species and blood cultures no growth x2 days. -Non-violent restraints for now (lap belt); reassess daily  Essential hypertension -Continue metoprolol  Prolonged QTc Noted. Consider medications that may cause prolonged QTc  GERD -Continue Protonix  COPD Chronic respiratory failure On chronic 2 L. Stable.   DVT prophylaxis: Heparin subq Code Status:   Code Status: DNR Family Communication: Discussed with daughter over the telephone. 13 minute call Disposition Plan: Anticipate possible hospital death with continued decline from COVID-19 infection.   Consultants:   None  Procedures:   None  Antimicrobials:  None    Subjective: Unable to provide history secondary to mental status.  Objective: Vitals:   12/15/18 1239 12/15/18 1333 12/15/18 2240 12/16/18 0611  BP: (!) 165/77  (!) 163/49 (!) 149/109  Pulse:   62 89  Resp: (!) 22 11 20 20   Temp: 99.2 F (37.3 C)  99.2 F (37.3 C) 99.6 F (37.6 C)  TempSrc: Axillary  Oral Oral  SpO2: 96%  91% 90%  Weight:        Intake/Output Summary (Last 24 hours) at 12/16/2018 1145 Last data filed at 12/16/2018 0303 Gross per 24 hour  Intake 639.17 ml  Output -  Net 639.17 ml   Filed Weights   12/07/2018 2150 12/13/18 1933  Weight: 43.5 kg 43.3 kg    Examination:  General exam: Appears calm and comfortable Respiratory system: Diminished. Increased respiratory rate Cardiovascular system: S1 & S2 heard, RRR. No murmurs, rubs, gallops or clicks. Gastrointestinal system:  Abdomen is nondistended, soft and nontender. No organomegaly or masses felt. Normal bowel sounds heard. Central nervous system: Alert and disoriented.  Extremities: No edema. No calf tenderness Skin: No cyanosis. No rashes Psychiatry: Judgement and insight appear impaired.    Data Reviewed: I have personally reviewed following labs and imaging studies  CBC: Recent Labs  Lab 12-27-2018 2138 12/13/18 1722 12/14/18 0450 12/15/18 0443 12/16/18 0658  WBC 3.4* 3.6* 2.9* 4.4 7.1  NEUTROABS 2.5  --   --  3.9 6.3  HGB 10.0* 8.7* 8.7* 8.9* 9.2*  HCT 31.0* 27.6* 27.4* 28.6* 30.2*  MCV 95.4 96.8 96.5 97.3 101.0*  PLT 177 154 146* 183 102   Basic Metabolic Panel: Recent Labs  Lab December 27, 2018 2138 12/13/18 1722 12/14/18 0450 12/15/18 0443 12/16/18 0658  NA 138 140 142 146* 146*  K 3.6 3.5 3.7 4.2 4.0  CL 101 105 110 113* 116*  CO2 24 24 19* 19* 20*  GLUCOSE 136* 119* 134* 157* 161*  BUN 60* 52* 47* 48* 51*  CREATININE 2.46* 1.94* 1.85* 2.02* 1.89*  CALCIUM 8.5* 8.0* 8.0* 8.6* 8.7*   GFR: CrCl cannot be calculated (Unknown ideal weight.). Liver Function Tests: Recent Labs  Lab December 27, 2018 2138 12/15/18 0443 12/16/18 0658  AST 71* 60* 51*  ALT 19 15 16   ALKPHOS 61 44 42  BILITOT 0.5 1.1 0.4  PROT 6.6 6.2* 6.0*  ALBUMIN 3.8 3.3* 3.2*   No results for input(s): LIPASE, AMYLASE in the last 168 hours. No results for input(s): AMMONIA in the last 168 hours. Coagulation Profile: No results for input(s): INR, PROTIME in the last 168 hours. Cardiac Enzymes: No results for input(s): CKTOTAL, CKMB, CKMBINDEX, TROPONINI in the last 168 hours. BNP (last 3 results) No results for input(s): PROBNP in the last 8760 hours. HbA1C: No results for input(s): HGBA1C in the last 72 hours. CBG: No results for input(s): GLUCAP in the last 168 hours. Lipid Profile: No results for input(s): CHOL, HDL, LDLCALC, TRIG, CHOLHDL, LDLDIRECT in the last 72 hours. Thyroid Function Tests: Recent Labs    12/14/18 0450  TSH 2.336   Anemia Panel: Recent Labs    12/14/18 0450 12/15/18 0443 12/16/18 0658  VITAMINB12 1,207*  --   --   FERRITIN  --   1,024* 1,376*   Sepsis Labs: No results for input(s): PROCALCITON, LATICACIDVEN in the last 168 hours.  Recent Results (from the past 240 hour(s))  SARS CORONAVIRUS 2 (TAT 6-24 HRS) Nasopharyngeal Nasopharyngeal Swab     Status: Abnormal   Collection Time: 12/27/18 12:35 AM   Specimen: Nasopharyngeal Swab  Result Value Ref Range Status   SARS Coronavirus 2 POSITIVE (A) NEGATIVE Final    Comment: CRITICAL RESULT CALLED TO, READ BACK BY AND VERIFIED WITH: K.OBERRY RN [id number] 12/13/2018 MCCORMICK K (NOTE) SARS-CoV-2 target nucleic acids are DETECTED. The SARS-CoV-2 RNA is generally detectable in upper and lower respiratory specimens during the acute phase of infection. Positive results are indicative of active infection with SARS-CoV-2. Clinical  correlation with patient history and other diagnostic information is necessary to determine patient infection status. Positive results do  not rule out bacterial infection or co-infection with other viruses. The expected result is Negative. Fact Sheet for Patients: SugarRoll.be Fact Sheet for Healthcare Providers: https://www.woods-mathews.com/ This test is not yet approved or cleared by the [place] FDA and  has been authorized for detection and/or diagnosis of SARS-CoV-2 by FDA under an Emergency Use Authorization (EUA). This EUA will remain  in effect (meaning this  test can b e used) for the duration of the COVID-19 declaration under Section 564(b)(1) of the Act, 21 U.S.C. section 360bbb-3(b)(1), unless the authorization is terminated or revoked sooner. Performed at [facility], [address] [place], [place] [address]   Urine Culture     Status: Abnormal   Collection Time: 2018-12-15  9:41 PM   Specimen: Urine, Random  Result Value Ref Range Status   Specimen Description   Final    URINE, RANDOM Performed at [facility], [address] [place], [place] [address]     Special Requests   Final    NONE Performed at [facility], [address] [place], [place] [address]    Culture (A)  Final    30,000 COLONIES/mL MULTIPLE SPECIES PRESENT, SUGGEST RECOLLECTION   Report Status 12/14/2018 FINAL  Final  Culture, blood (routine x 2)     Status: None (Preliminary result)   Collection Time: 12/13/18  5:20 PM   Specimen: BLOOD LEFT HAND  Result Value Ref Range Status   Specimen Description   Final    BLOOD LEFT HAND Performed at [facility], [address], [place], [place] [address]    Special Requests   Final    BOTTLES DRAWN AEROBIC ONLY Blood Culture results may not be optimal due to an inadequate volume of blood received in culture bottles Performed at [facility], [address], [place], [place] [address]    Culture   Final    NO GROWTH 3 DAYS Performed at [facility], [address], [place], [place] [address]    Report Status PENDING  Incomplete  Culture, blood (routine x 2)     Status: None (Preliminary result)   Collection Time: 12/13/18  5:22 PM   Specimen: BLOOD RIGHT HAND  Result Value Ref Range Status   Specimen Description   Final    BLOOD RIGHT HAND Performed at [facility], [address], [place], [place] [address]    Special Requests   Final    BOTTLES DRAWN AEROBIC ONLY Blood Culture adequate volume Performed at [facility], [address], [place], [place] [address]    Culture   Final    NO GROWTH 3 DAYS Performed at [facility], [address], [place], [place] [address]    Report Status PENDING  Incomplete  MRSA PCR Screening     Status: Abnormal   Collection Time: 12/13/18  7:21 PM   Specimen: Nasopharyngeal  Result Value Ref Range Status   MRSA by PCR POSITIVE (A) NEGATIVE Final    Comment:        The GeneXpert MRSA Assay (FDA approved for NASAL specimens only), is one component of a comprehensive MRSA  colonization surveillance program. It is not intended to diagnose MRSA infection nor to guide or monitor treatment for MRSA infections. RESULT CALLED TO, READ BACK BY AND VERIFIED WITH: A BROWN,RN 12/14/18 1125 RHOLMES Performed at [facility], [address], [place], [place] [address]          Radiology Studies: No results found.      Scheduled Meds: . cholecalciferol  125 mcg Oral Daily  . feeding supplement (ENSURE ENLIVE)  237 mL Oral BID BM  . heparin injection (subcutaneous)  7,500 Units Subcutaneous Q8H  . influenza vaccine adjuvanted  0.5 mL Intramuscular Tomorrow-1000  . levothyroxine  25 mcg Oral Q0600  .  mouth rinse  15 mL Mouth Rinse BID  . methylPREDNISolone (SOLU-MEDROL) injection  1 mg/kg/day Intravenous Q12H  . metoprolol tartrate  25 mg Oral Daily  . potassium chloride  20 mEq Oral BID  . pregabalin  75 mg Oral Daily  . psyllium  1 packet Oral Daily   Continuous Infusions: . dextrose 5 % and 0.45% NaCl 50 mL/hr at 12/15/18 1640     LOS: 2 days     Jacquelin Hawking, MD [facility] 12/16/2018, 11:45 AM  If 7PM-7AM, please contact night-coverage [email or web address]

## 2018-12-16 NOTE — Progress Notes (Signed)
[facility]:  [employer]  Pt is a current hospice pt under the care of [facility]. Pt family in agreement with care so far and would look at comfort care on if pts condition worsens.   Webb Silversmith RN [phone]

## 2018-12-16 NOTE — TOC Progression Note (Signed)
Transition of Care [facility]) - Progression Note    Patient Details  Name: Pamela Peterson MRN: [id number] Date of Birth: September 21, 1919  Transition of [facility]) CM/SW Contact  Purcell Mouton, RN Phone Number: 12/16/2018, 4:49 PM  Clinical Narrative:     Pt with Severe Dementia, COVID+,  from [facility].   Expected Discharge Plan: Assisted Living    Expected Discharge Plan and Services Expected Discharge Plan: Assisted Living   Discharge Planning Services: CM Consult   Living arrangements for the past 2 months: Assisted Living Facility                                       Social Determinants of Health (SDOH) Interventions    Readmission Risk Interventions No flowsheet data found.

## 2018-12-17 DIAGNOSIS — E871 Hypo-osmolality and hyponatremia: Secondary | ICD-10-CM

## 2018-12-17 MED ORDER — MORPHINE SULFATE (PF) 2 MG/ML IV SOLN
2.0000 mg | INTRAVENOUS | Status: DC | PRN
  Administered 2018-12-17 – 2018-12-18 (×4): 2 mg via INTRAVENOUS
  Filled 2018-12-17 (×4): qty 1

## 2018-12-17 MED ORDER — LORAZEPAM 2 MG/ML IJ SOLN
1.0000 mg | INTRAMUSCULAR | Status: DC | PRN
  Administered 2018-12-17 (×2): 1 mg via INTRAVENOUS
  Filled 2018-12-17 (×2): qty 1

## 2018-12-17 NOTE — Progress Notes (Signed)
Report given to receiving RN. [facility] arrived to transport pt to [facility]. Related paperwork given to transporters. Pt stable upon departure.

## 2018-12-17 NOTE — Progress Notes (Signed)
PROGRESS NOTE                                                                                                                                                                                                             Patient Demographics:    Pamela Peterson, is a 85 y.o. female, DOB - 1920-01-09, KPV:[id number]  Outpatient Primary MD for the patient is Nicola Girt, DO    LOS - 3  Admit date - 12/27/2018    Chief Complaint  Patient presents with  . Altered Mental Status       Brief Narrative  Pamela Peterson is a 85 y.o. female,with history of hypertension, severe cardiomyopathy with EF of 25-30% as per prior echo, depression and anxiety, moderate to severe dementia, hypothyroidism, COPD on chronic home O2. Patient presented secondary to mental status change and found to be COVID positive with concern for a UTI.   Subjective:    Pamela Peterson today  remains obtunded   Assessment  & Plan :     1. Acute Hypoxic Resp. Failure due to Acute Covid 19 Viral Pneumonitis during the ongoing 2020 Covid 19 Pandemic - 85 y.o. female,with history of hypertension, severe cardiomyopathy with EF of 25-30% as per prior echo, depression and anxiety, moderate to severe dementia, hypothyroidism, COPD on chronic home O2. Patient presented secondary to mental status change due to Covid 19 infection - she was admitted with comfort measures with no aggressive Rx. Goal is comfort only, Residential Hospice if possible. Death looks imminent.    COVID-19 Labs  Recent Labs    12/15/18 0443 12/16/18 0658  DDIMER >20.00* >20.00*  FERRITIN 1,024* 1,376*  CRP 10.6* 12.4*    Lab Results  Component Value Date   SARSCOV2NAA POSITIVE (A) 12/09/2018     SpO2: 92 % O2 Flow Rate (L/min): 4 L/min  Hepatic Function Latest Ref Rng & Units 12/16/2018 12/15/2018 12/09/2018  Total Protein 6.5 - 8.1 g/dL 6.0(L) 6.2(L) 6.6  Albumin 3.5 - 5.0 g/dL 3.2(L)  3.3(L) 3.8  AST 15 - 41 U/L 51(H) 60(H) 71(H)  ALT 0 - 44 U/L _0 Alk Phosphatase 38 - 126 U/L 42 44 61  Total Bilirubin 0.3 - 1.2 mg/dL 0.4 1.1 0.5        Component Value Date/Time   BNP 3,614.1 (H) 03/02/2017 0112  Condition - Extremely Guarded  Family Communication  :  Daughter 10/20  Code Status :  DNR  Diet :    Diet Order            Diet NPO time specified Except for: Sips with Meds  Diet effective now               Disposition Plan  :  Res. Hospice  Consults  :    DVT Prophylaxis  :  Comfort Rx  Lab Results  Component Value Date   PLT 191 12/16/2018    Inpatient Medications  Scheduled Meds: . cholecalciferol  125 mcg Oral Daily  . feeding supplement (ENSURE ENLIVE)  237 mL Oral BID BM  . influenza vaccine adjuvanted  0.5 mL Intramuscular Tomorrow-1000  . levothyroxine  25 mcg Oral Q0600  . mouth rinse  15 mL Mouth Rinse BID  . metoprolol tartrate  25 mg Oral Daily  . pregabalin  75 mg Oral Daily  . psyllium  1 packet Oral Daily   Continuous Infusions: PRN Meds:.acetaminophen **OR** acetaminophen, alum & mag hydroxide-simeth, glycopyrrolate **OR** glycopyrrolate **OR** glycopyrrolate, haloperidol **OR** haloperidol **OR** haloperidol lactate, hydrALAZINE, lip balm, loperamide, LORazepam, morphine injection  Antibiotics  :    Anti-infectives (From admission, onward)   None       Time Spent in minutes  30   Lala Lund M.D on 12/17/2018 at 9:29 AM  To page go to [email or web address] - password [facility]  [facility] -  Office  [phone]    See all Orders from today for further details    Objective:   Vitals:   12/17/18 0227 12/17/18 0254 12/17/18 0419 12/17/18 0800  BP:  (!) 176/92 (!) 170/80 (!) 87/65  Pulse:  98 99 90  Resp: _0 Temp: 97.9 F (36.6 C) 97.9 F (36.6 C) 97.8 F (36.6 C) (!) 97.5 F (36.4 C)  TempSrc:  Oral  Axillary  SpO2: 94%  92%   Weight:  43.3 kg    Height:  5' (1.524 m)       Wt Readings from Last 3 Encounters:  12/17/18 43.3 kg  03/05/17 36.5 kg  01/31/17 34.9 kg     Intake/Output Summary (Last 24 hours) at 12/17/2018 0929 Last data filed at 12/16/2018 1949 Gross per 24 hour  Intake 0 ml  Output -  Net 0 ml     Physical Exam  Obtunded, unable to answer questions or follow commands      Data Review:    CBC Recent Labs  Lab 12/01/2018 2138 12/13/18 1722 12/14/18 0450 12/15/18 0443 12/16/18 0658  WBC 3.4* 3.6* 2.9* 4.4 7.1  HGB 10.0* 8.7* 8.7* 8.9* 9.2*  HCT 31.0* 27.6* 27.4* 28.6* 30.2*  PLT 177 154 146* 183 191  MCV 95.4 96.8 96.5 97.3 101.0*  MCH 30.8 30.5 30.6 30.3 30.8  MCHC 32.3 31.5 31.8 31.1 30.5  RDW 13.8 13.9 13.9 14.2 14.5  LYMPHSABS 0.6*  --   --  0.4* 0.4*  MONOABS 0.3  --   --  0.2 0.3  EOSABS 0.0  --   --  0.0 0.0  BASOSABS 0.0  --   --  0.0 0.0    Chemistries  Recent Labs  Lab 12/02/2018 2138 12/13/18 1722 12/14/18 0450 12/15/18 0443 12/16/18 0658  NA 138 140 142 146* 146*  K 3.6 3.5 3.7 4.2 4.0  CL 101 105 110 113* 116*  CO2 24 24 19* 19* 20*  GLUCOSE 136*  119* 134* 157* 161*  BUN 60* 52* 47* 48* 51*  CREATININE 2.46* 1.94* 1.85* 2.02* 1.89*  CALCIUM 8.5* 8.0* 8.0* 8.6* 8.7*  AST 71*  --   --  60* 51*  ALT 19  --   --  15 16  ALKPHOS 61  --   --  44 42  BILITOT 0.5  --   --  1.1 0.4   ------------------------------------------------------------------------------------------------------------------ No results for input(s): CHOL, HDL, LDLCALC, TRIG, CHOLHDL, LDLDIRECT in the last 72 hours.  No results found for: HGBA1C ------------------------------------------------------------------------------------------------------------------ No results for input(s): TSH, T4TOTAL, T3FREE, THYROIDAB in the last 72 hours.  Invalid input(s): FREET3  Cardiac Enzymes No results for input(s): CKMB, TROPONINI, MYOGLOBIN in the last 168 hours.  Invalid input(s): CK  ------------------------------------------------------------------------------------------------------------------    Component Value Date/Time   BNP 3,614.1 (H) 03/02/2017 0112    Micro Results Recent Results (from the past 240 hour(s))  SARS CORONAVIRUS 2 (TAT 6-24 HRS) Nasopharyngeal Nasopharyngeal Swab     Status: Abnormal   Collection Time: 12/05/2018 12:35 AM   Specimen: Nasopharyngeal Swab  Result Value Ref Range Status   SARS Coronavirus 2 POSITIVE (A) NEGATIVE Final    Comment: CRITICAL RESULT CALLED TO, READ BACK BY AND VERIFIED WITH: K.OBERRY RN [id number] 12/13/2018 MCCORMICK K (NOTE) SARS-CoV-2 target nucleic acids are DETECTED. The SARS-CoV-2 RNA is generally detectable in upper and lower respiratory specimens during the acute phase of infection. Positive results are indicative of active infection with SARS-CoV-2. Clinical  correlation with patient history and other diagnostic information is necessary to determine patient infection status. Positive results do  not rule out bacterial infection or co-infection with other viruses. The expected result is Negative. Fact Sheet for Patients: SugarRoll.be Fact Sheet for Healthcare Providers: https://www.woods-mathews.com/ This test is not yet approved or cleared by the [place] FDA and  has been authorized for detection and/or diagnosis of SARS-CoV-2 by FDA under an Emergency Use Authorization (EUA). This EUA will remain  in effect (meaning this test can b e used) for the duration of the COVID-19 declaration under Section 564(b)(1) of the Act, 21 U.S.C. section 360bbb-3(b)(1), unless the authorization is terminated or revoked sooner. Performed at [facility], [place] [address], [place], [place] [address]   Urine Culture     Status: Abnormal   Collection Time: 12/28/2018  9:41 PM   Specimen: Urine, Random  Result Value Ref Range Status   Specimen Description   Final    URINE,  RANDOM Performed at [facility], [address] [place], [place] [address]    Special Requests   Final    NONE Performed at [facility], [address] [place], [place] [address]    Culture (A)  Final    30,000 COLONIES/mL MULTIPLE SPECIES PRESENT, SUGGEST RECOLLECTION   Report Status 12/14/2018 FINAL  Final  Culture, blood (routine x 2)     Status: None (Preliminary result)   Collection Time: 12/13/18  5:20 PM   Specimen: BLOOD LEFT HAND  Result Value Ref Range Status   Specimen Description   Final    BLOOD LEFT HAND Performed at [facility], [place] [address], [place], [place] [address]    Special Requests   Final    BOTTLES DRAWN AEROBIC ONLY Blood Culture results may not be optimal due to an inadequate volume of blood received in culture bottles Performed at [place] [address] [place], [place] [address]  Culture   Final    NO GROWTH 4 DAYS Performed at [facility], [place] [address] [place], [place] [address]    Report Status PENDING  Incomplete  Culture, blood (routine x 2)     Status: None (Preliminary result)   Collection Time: 12/13/18  5:22 PM   Specimen: BLOOD RIGHT HAND  Result Value Ref Range Status   Specimen Description   Final    BLOOD RIGHT HAND Performed at [place] [address], [place], [place] [address]    Special Requests   Final    BOTTLES DRAWN AEROBIC ONLY Blood Culture adequate volume Performed at [place] [address], [place], [place] [address]    Culture   Final    NO GROWTH 4 DAYS Performed at [facility], [place] [address], [place], [place] [address]    Report Status PENDING  Incomplete  MRSA PCR Screening     Status: Abnormal   Collection Time: 12/13/18  7:21 PM   Specimen: Nasopharyngeal  Result Value Ref Range Status   MRSA by PCR POSITIVE (A) NEGATIVE Final    Comment:        The GeneXpert MRSA Assay  (FDA approved for NASAL specimens only), is one component of a comprehensive MRSA colonization surveillance program. It is not intended to diagnose MRSA infection nor to guide or monitor treatment for MRSA infections. RESULT CALLED TO, READ BACK BY AND VERIFIED WITH: A BROWN,RN 12/14/18 1125 RHOLMES Performed at [facility], [place] [address], [place],  [address]     Radiology Reports Dg Chest Port 1 View  Result Date: 12/02/2018 CLINICAL DATA:  85 year old female with fever and altered mental status. EXAM: PORTABLE CHEST 1 VIEW COMPARISON:  Chest radiographs 03/03/2017 and earlier. FINDINGS: Portable AP upright view at 2209 hours. Substantially regressed but not fully resolved left pleural effusion since 2019, small residual. Mildly lower lung volumes. No pneumothorax, pulmonary edema or new pulmonary opacity. Stable cardiac size and mediastinal contours. Small calcified granulomas or chest wall calcifications on the right are unchanged. Negative visible bowel gas pattern. No acute osseous abnormality identified. IMPRESSION: 1. Substantially regressed but not fully resolved left pleural effusion since 2019. 2. Negative lungs elsewhere.  No new cardiopulmonary abnormality. Electronically Signed   By: Genevie Ann M.D.   On: 12/11/2018 22:43

## 2018-12-17 NOTE — Plan of Care (Signed)
Will continue with plan of care. 

## 2018-12-18 LAB — CULTURE, BLOOD (ROUTINE X 2)
Culture: NO GROWTH
Culture: NO GROWTH
Special Requests: ADEQUATE

## 2018-12-29 NOTE — Plan of Care (Signed)
Will continue with plan of care. 

## 2018-12-29 NOTE — Death Summary Note (Signed)
Death Summary  [name] IDP:[id number] DOB: [birth year] DOA: 12/13/18  PCP: Nicola Girt, DO  Admit date: December 13, 2018 Date of Death: 2018-12-19  Final Diagnoses:  Active Problems:   Hypothyroidism   Essential hypertension   GERD (gastroesophageal reflux disease)   Hyponatremia   AKI (acute kidney injury) (HCC)   Normochromic normocytic anemia   ARF (acute renal failure) (HCC)   Acute metabolic encephalopathy   COPD (chronic obstructive pulmonary disease) with emphysema (HCC)   Fever in adult    1. Acute hypoxic respiratory failure in the setting of COVID-19 pneumonia  History of present illness:   Pamela Peterson a 85 y.o.female,with history of hypertension, severe cardiomyopathy with EF of 25-30% as per prior echo, depression and anxiety, moderate to severe dementia, hypothyroidism, COPD on chronic home O2. Patient presented secondary to mental status change and found to be COVID positive with concern for a UTI  Hospital Course:  Patient admitted as above, continued worsening encephalopathy, hypoxia, eventually made comfort measures per discussion with family previously.  Patient passed away on 12/19/2018 at 11:11 AM.  Signed:  Little Ishikawa  [facility] Dec 19, 2018, 11:47 AM

## 2018-12-29 NOTE — Death Summary Note (Addendum)
Absence of breath sounds, No heart sounds auscultated.  Witnessed my Estil Daft, RN

## 2018-12-29 NOTE — Progress Notes (Signed)
Family notified at this time of patient passing away. And MD notified.

## 2018-12-29 NOTE — Death Summary Note (Signed)
Pt has absence of breath sounds.  No heart sounds auscultated.  Confirmed by Levie Heritage, RN.

## 2018-12-29 DEATH — deceased

## 2019-07-28 IMAGING — DX DG CHEST 1V PORT
1 series · 1 of 1 positions shown · non-contrast
Comparison: 03/01/2015 radiographs of the chest

CLINICAL DATA: [AGE] female status post fall while walking.
No syncope reported. Dyspnea.

EXAM:
PORTABLE CHEST 1 VIEW

[chest]
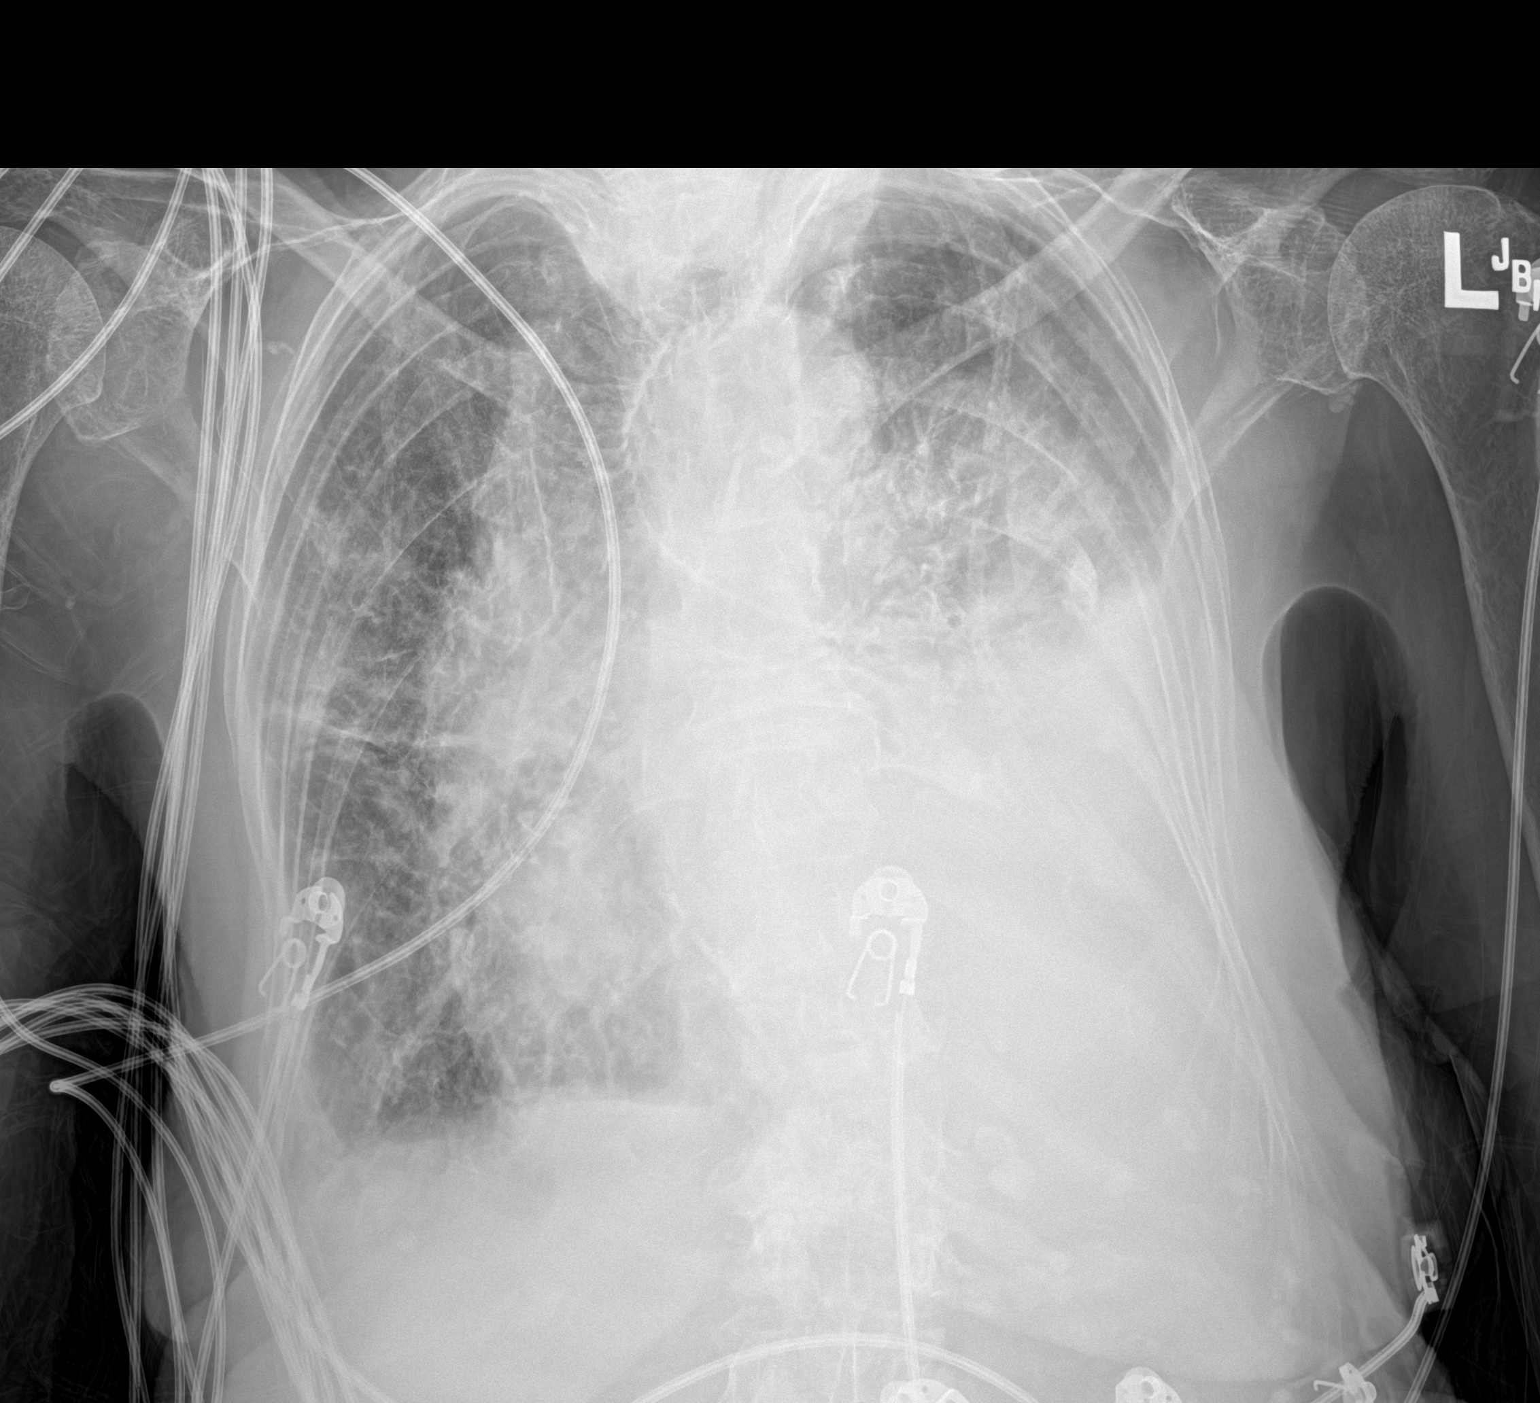

[1 of 1 positions shown; findings below may reference images not displayed]

FINDINGS: The left heart border is obscured by confluent pulmonary opacity
likely to represent a moderate to large left pleural effusion with
compressive atelectasis. Trace right pleural effusion. Pulmonary
vascular congestion is noted. The thoracic aorta is calcified and
ectatic. Deviation of the trachea convex to the right due to
tortuous thoracic aorta. No apparent fracture or pneumothorax. Small
loose bodies are seen in the left axillary pouch.
IMPRESSION: 1. Bilateral pleural effusions, moderate to large on the left and
trace on the right with pulmonary vascular congestion.
2. Aortic atherosclerosis.
3. No acute osseous appearing abnormality.
